# Patient Record
Sex: Female | Born: 1955 | Race: Black or African American | Hispanic: No | Smoking: Current every day smoker
Health system: Southern US, Community
[De-identification: ages and names within clinical notes are randomized; demographics above are authoritative.]

## PROBLEM LIST (undated history)

## (undated) DIAGNOSIS — C189 Malignant neoplasm of colon, unspecified: Secondary | ICD-10-CM

## (undated) DIAGNOSIS — D509 Iron deficiency anemia, unspecified: Secondary | ICD-10-CM

## (undated) DIAGNOSIS — E119 Type 2 diabetes mellitus without complications: Secondary | ICD-10-CM

## (undated) DIAGNOSIS — F209 Schizophrenia, unspecified: Secondary | ICD-10-CM

## (undated) DIAGNOSIS — T7840XA Allergy, unspecified, initial encounter: Secondary | ICD-10-CM

## (undated) DIAGNOSIS — K219 Gastro-esophageal reflux disease without esophagitis: Secondary | ICD-10-CM

## (undated) HISTORY — DX: Type 2 diabetes mellitus without complications: E11.9

## (undated) HISTORY — DX: Schizophrenia, unspecified: F20.9

## (undated) HISTORY — PX: EXCISIONAL HEMORRHOIDECTOMY: SHX1541

## (undated) HISTORY — DX: Gastro-esophageal reflux disease without esophagitis: K21.9

## (undated) HISTORY — DX: Iron deficiency anemia, unspecified: D50.9

## (undated) HISTORY — DX: Allergy, unspecified, initial encounter: T78.40XA

---

## 2002-03-08 ENCOUNTER — Encounter: Payer: Self-pay | Admitting: Internal Medicine

## 2002-03-08 ENCOUNTER — Ambulatory Visit (HOSPITAL_COMMUNITY): Admission: RE | Admit: 2002-03-08 | Discharge: 2002-03-08 | Payer: Self-pay | Admitting: Internal Medicine

## 2003-03-14 ENCOUNTER — Ambulatory Visit (HOSPITAL_COMMUNITY): Admission: RE | Admit: 2003-03-14 | Discharge: 2003-03-14 | Payer: Self-pay | Admitting: Internal Medicine

## 2003-03-14 ENCOUNTER — Encounter: Payer: Self-pay | Admitting: Internal Medicine

## 2003-04-29 ENCOUNTER — Other Ambulatory Visit: Admission: RE | Admit: 2003-04-29 | Discharge: 2003-04-29 | Payer: Self-pay | Admitting: Obstetrics & Gynecology

## 2005-02-14 ENCOUNTER — Ambulatory Visit (HOSPITAL_COMMUNITY): Admission: RE | Admit: 2005-02-14 | Discharge: 2005-02-14 | Payer: Self-pay | Admitting: Internal Medicine

## 2006-02-24 ENCOUNTER — Ambulatory Visit (HOSPITAL_COMMUNITY): Admission: RE | Admit: 2006-02-24 | Discharge: 2006-02-24 | Payer: Self-pay | Admitting: Internal Medicine

## 2006-04-21 ENCOUNTER — Ambulatory Visit (HOSPITAL_COMMUNITY): Admission: RE | Admit: 2006-04-21 | Discharge: 2006-04-21 | Payer: Self-pay | Admitting: Gastroenterology

## 2006-04-21 ENCOUNTER — Ambulatory Visit: Payer: Self-pay | Admitting: Gastroenterology

## 2006-04-21 HISTORY — PX: COLONOSCOPY: SHX174

## 2007-06-03 ENCOUNTER — Ambulatory Visit (HOSPITAL_COMMUNITY): Admission: RE | Admit: 2007-06-03 | Discharge: 2007-06-03 | Payer: Self-pay | Admitting: Internal Medicine

## 2009-01-20 ENCOUNTER — Encounter: Payer: Self-pay | Admitting: Gastroenterology

## 2009-02-01 ENCOUNTER — Ambulatory Visit (HOSPITAL_COMMUNITY): Admission: RE | Admit: 2009-02-01 | Discharge: 2009-02-01 | Payer: Self-pay | Admitting: Internal Medicine

## 2009-02-22 ENCOUNTER — Ambulatory Visit: Payer: Self-pay | Admitting: Gastroenterology

## 2009-02-22 ENCOUNTER — Ambulatory Visit (HOSPITAL_COMMUNITY): Admission: RE | Admit: 2009-02-22 | Discharge: 2009-02-22 | Payer: Self-pay | Admitting: Gastroenterology

## 2009-02-22 HISTORY — PX: COLONOSCOPY: SHX174

## 2009-03-15 ENCOUNTER — Other Ambulatory Visit: Admission: RE | Admit: 2009-03-15 | Discharge: 2009-03-15 | Payer: Self-pay | Admitting: Obstetrics and Gynecology

## 2009-12-15 ENCOUNTER — Encounter: Payer: Self-pay | Admitting: Urgent Care

## 2010-08-07 ENCOUNTER — Ambulatory Visit (HOSPITAL_COMMUNITY)
Admission: RE | Admit: 2010-08-07 | Discharge: 2010-08-07 | Payer: Self-pay | Source: Home / Self Care | Attending: Internal Medicine | Admitting: Internal Medicine

## 2010-08-26 ENCOUNTER — Encounter: Payer: Self-pay | Admitting: Internal Medicine

## 2010-08-27 ENCOUNTER — Encounter: Payer: Self-pay | Admitting: Internal Medicine

## 2010-09-04 NOTE — Medication Information (Signed)
Summary: Tax adviser   Imported By: Diana Eves 12/15/2009 15:22:27  _____________________________________________________________________  External Attachment:    Type:   Image     Comment:   External Document  Appended Document: RX Folder:BENEFIBER    Prescriptions: BENEFIBER  TABS (WHEAT DEXTRIN) 1-2 by mouth daily as directed  #62 x 11   Entered and Authorized by:   Joselyn Arrow FNP-BC   Signed by:   Joselyn Arrow FNP-BC on 12/18/2009   Method used:   Electronically to        [facility] (retail)       [address]       [place], [place]  [address]       Ph: [phone]       Fax: [phone]   RxID:   [id number]

## 2010-11-07 ENCOUNTER — Telehealth: Payer: Self-pay

## 2010-11-07 NOTE — Telephone Encounter (Signed)
Fax from [facility]..the patient resides at [facility]...currently on Benefiber caplets one bid ..currently unavailable. Fax says. May we d/c benefiber, start metamucil caps one po bid or another choice. Please advise.

## 2010-11-08 NOTE — Telephone Encounter (Signed)
Informed [facility] @ [facility].

## 2010-11-08 NOTE — Telephone Encounter (Signed)
D/c Benefiber. Metamucil 1 cap BID.

## 2010-12-18 NOTE — Op Note (Signed)
NAME:  Mary Wells, Mary Wells                   ACCOUNT NO.:  [id number]   MEDICAL RECORD NO.:  [id number]          PATIENT TYPE:  AMB   LOCATION:  DAY                           FACILITY:  [facility]   PHYSICIAN:  Kassie Mends, M.D.      DATE OF BIRTH:  [birth year]   DATE OF PROCEDURE:  02/22/2009  DATE OF DISCHARGE:  02/22/2009                               OPERATIVE REPORT   REFERRING PHYSICIAN:  Tesfaye D. Felecia Shelling, MD   PROCEDURE:  Limited colonoscopy due to poor bowel prep   INDICATION FOR EXAM:  Mary Wells is a 55 year old female, who presented in  September 2007 for a colonoscopy.  She had a poor bowel prep which  showed moderate liquid and formed stool and the cecum was not  visualized.  She was asked to come back in 6 months to 1 year for a  repeat colonoscopy with a 2-day bowel prep.  She presented in 2010 for  her screening and yesterday she had a poor bowel prep.  She was asked to  take another gallon of GoLYTELY and return today for an exam.   FINDINGS:  1. Formed stools in the right colon which limited the extent of the      exam.  2. The scope was passed to approximately the proximal transverse      colon.  No polyps, masses, inflammatory changes, diverticular, or      AVMs were seen.  3. Normal retroflexed view of the rectum.   RECOMMENDATIONS:  1. Colonoscopy in 5 years with 2-day bowel prep under direct      supervision.  2. She should follow a high-fiber diet.  She is given a handout on a      high-fiber diet.   MEDICATIONS:  1. Demerol 50 mg IV.  2. Versed 3 mg IV.   PROCEDURE TECHNIQUE:  Physical exam was performed.  Informed consent was  obtained from the patient after explaining the benefits, risks, and  alternatives to the procedure.  The patient was connected to the monitor  and placed in the left lateral position.  Continuous oxygen was provided  by nasal cannula.  IV medicine administered through an indwelling  cannula.  After administration of sedation and rectal exam,  the  patient's rectum was intubated  and the scope was advanced under direct visualization to the proximal  transverse colon.  The scope was removed slowly by carefully examining  the color, texture, anatomy, and integrity of the mucosa on the way out.  The patient was recovered in endoscopy and discharged home in  satisfactory condition.      Kassie Mends, M.D.  Electronically Signed     SM/MEDQ  D:  02/22/2009  T:  02/23/2009  Job:  [id number]   cc:   Mary D. Felecia Shelling, MD  Fax: [phone]

## 2010-12-21 NOTE — Op Note (Signed)
NAME:  Mary Wells, Mary Wells                   ACCOUNT NO.:  [id number]   MEDICAL RECORD NO.:  [id number]          PATIENT TYPE:  AMB   LOCATION:  DAY                           FACILITY:  [facility]   PHYSICIAN:  Kassie Mends, M.D.      DATE OF BIRTH:  03/05/1956   DATE OF PROCEDURE:  04/21/2006  DATE OF DISCHARGE:  04/21/2006                                 OPERATIVE REPORT   PROCEDURE:  Colonoscopy.   INDICATION FOR EXAM:  [name] is a 55 year old female who presents for  average risk colon cancer screening.   FINDINGS:  [name] had a limited colonoscopy due to a poor bowel prep with  moderate amount of retained liquid and formed stool.  Polyps less than 1 cm  would have been easily missed.  No masses or hemorrhoids were seen.   RECOMMENDATIONS:  1. Repeat colonoscopy in six months to one year with two day bowel prep      using Osmoprep.  2. Follow up with Dr. Felecia Shelling.   MEDICATIONS:  1. Demerol 75 mg IV.  2. Versed 3 mg IV.   Physical exam was performed.  Informed consent was obtained from the patient  after explaining the benefits, risks and alternatives to the procedure.  The  patient was connected to the monitor and placed in the left lateral  position.  Continuous oxygen was provided by nasal cannula. IV medicines  were administered via indwelling cannula.  After administration of sedation  and rectal exam, the patient's rectum was intubated.  The scope was advanced  under direct visualization to the right colon.  The scope could not be  advanced beyond the right colon due to the visualization was impaired by a  large amount of liquid stool and the liquid stool could not be aspirated due  to the quantity of particulate matter contained in that liquid stool.  The  particular matter was clogging up the scope.  The  scope was subsequently removed slowly by carefully examining the color,  texture, anatomy, and integrity of the mucosa on the way out.  It was a  limited view.  The patient was  recovered in the endoscopy suite and  discharged home in satisfactory condition.      Kassie Mends, M.D.  Electronically Signed     SM/MEDQ  D:  04/22/2006  T:  04/23/2006  Job:  [id number]   cc:   Tesfaye D. Felecia Shelling, MD  Fax: [phone]

## 2013-07-13 ENCOUNTER — Other Ambulatory Visit (HOSPITAL_COMMUNITY): Payer: Self-pay | Admitting: Internal Medicine

## 2013-07-13 ENCOUNTER — Encounter: Payer: Self-pay | Admitting: Gastroenterology

## 2013-07-13 DIAGNOSIS — Z139 Encounter for screening, unspecified: Secondary | ICD-10-CM

## 2013-07-20 ENCOUNTER — Ambulatory Visit (HOSPITAL_COMMUNITY)
Admission: RE | Admit: 2013-07-20 | Discharge: 2013-07-20 | Disposition: A | Discharge status: Home / Self Care | Payer: Medicaid Other | Source: Ambulatory Visit | Attending: Internal Medicine | Admitting: Internal Medicine

## 2013-07-20 DIAGNOSIS — Z139 Encounter for screening, unspecified: Secondary | ICD-10-CM

## 2013-07-20 DIAGNOSIS — Z1231 Encounter for screening mammogram for malignant neoplasm of breast: Secondary | ICD-10-CM | POA: Insufficient documentation

## 2013-07-21 ENCOUNTER — Ambulatory Visit: Payer: Self-pay | Admitting: Gastroenterology

## 2013-07-21 ENCOUNTER — Telehealth: Payer: Self-pay | Admitting: Gastroenterology

## 2013-07-21 NOTE — Telephone Encounter (Signed)
Pt was a no show

## 2013-08-05 DIAGNOSIS — E119 Type 2 diabetes mellitus without complications: Secondary | ICD-10-CM

## 2013-08-05 HISTORY — DX: Type 2 diabetes mellitus without complications: E11.9

## 2013-09-15 ENCOUNTER — Encounter (HOSPITAL_COMMUNITY): Payer: Medicaid Other | Attending: Hematology and Oncology

## 2013-09-15 ENCOUNTER — Encounter (HOSPITAL_COMMUNITY): Payer: Self-pay

## 2013-09-15 VITALS — BP 125/69 | HR 108 | Temp 98.1°F | Resp 20 | Ht 64.0 in | Wt 164.8 lb

## 2013-09-15 DIAGNOSIS — E611 Iron deficiency: Secondary | ICD-10-CM

## 2013-09-15 DIAGNOSIS — Z87891 Personal history of nicotine dependence: Secondary | ICD-10-CM | POA: Insufficient documentation

## 2013-09-15 DIAGNOSIS — F209 Schizophrenia, unspecified: Secondary | ICD-10-CM | POA: Insufficient documentation

## 2013-09-15 DIAGNOSIS — G259 Extrapyramidal and movement disorder, unspecified: Secondary | ICD-10-CM | POA: Insufficient documentation

## 2013-09-15 DIAGNOSIS — E119 Type 2 diabetes mellitus without complications: Secondary | ICD-10-CM | POA: Insufficient documentation

## 2013-09-15 DIAGNOSIS — D509 Iron deficiency anemia, unspecified: Secondary | ICD-10-CM

## 2013-09-15 DIAGNOSIS — K219 Gastro-esophageal reflux disease without esophagitis: Secondary | ICD-10-CM | POA: Insufficient documentation

## 2013-09-15 DIAGNOSIS — T433X5A Adverse effect of phenothiazine antipsychotics and neuroleptics, initial encounter: Secondary | ICD-10-CM | POA: Insufficient documentation

## 2013-09-15 DIAGNOSIS — D649 Anemia, unspecified: Secondary | ICD-10-CM

## 2013-09-15 LAB — CBC WITH DIFFERENTIAL/PLATELET
BASOS PCT: 0 % (ref 0–1)
Basophils Absolute: 0 10*3/uL (ref 0.0–0.1)
Eosinophils Absolute: 0 10*3/uL (ref 0.0–0.7)
Eosinophils Relative: 0 % (ref 0–5)
HEMATOCRIT: 29.8 % — AB (ref 36.0–46.0)
HEMOGLOBIN: 9 g/dL — AB (ref 12.0–15.0)
LYMPHS ABS: 2.2 10*3/uL (ref 0.7–4.0)
LYMPHS PCT: 19 % (ref 12–46)
MCH: 18.8 pg — ABNORMAL LOW (ref 26.0–34.0)
MCHC: 30.2 g/dL (ref 30.0–36.0)
MCV: 62.3 fL — AB (ref 78.0–100.0)
MONOS PCT: 10 % (ref 3–12)
Monocytes Absolute: 1.1 10*3/uL — ABNORMAL HIGH (ref 0.1–1.0)
NEUTROS ABS: 8.1 10*3/uL — AB (ref 1.7–7.7)
Neutrophils Relative %: 71 % (ref 43–77)
Platelets: 416 10*3/uL — ABNORMAL HIGH (ref 150–400)
RBC: 4.78 MIL/uL (ref 3.87–5.11)
RDW: 20.5 % — ABNORMAL HIGH (ref 11.5–15.5)
WBC Morphology: INCREASED
WBC: 11.4 10*3/uL — AB (ref 4.0–10.5)

## 2013-09-15 LAB — IRON AND TIBC
IRON: 18 ug/dL — AB (ref 42–135)
SATURATION RATIOS: 5 % — AB (ref 20–55)
TIBC: 349 ug/dL (ref 250–470)
UIBC: 331 ug/dL (ref 125–400)

## 2013-09-15 LAB — COMPREHENSIVE METABOLIC PANEL
ALBUMIN: 2.8 g/dL — AB (ref 3.5–5.2)
ALK PHOS: 93 U/L (ref 39–117)
ALT: 10 U/L (ref 0–35)
AST: 17 U/L (ref 0–37)
BUN: 12 mg/dL (ref 6–23)
CO2: 25 mEq/L (ref 19–32)
Calcium: 9.7 mg/dL (ref 8.4–10.5)
Chloride: 103 mEq/L (ref 96–112)
Creatinine, Ser: 0.91 mg/dL (ref 0.50–1.10)
GFR calc Af Amer: 78 mL/min — ABNORMAL LOW (ref >90)
GFR calc non Af Amer: 68 mL/min — ABNORMAL LOW (ref >90)
Glucose, Bld: 165 mg/dL — ABNORMAL HIGH (ref 70–99)
POTASSIUM: 4.1 meq/L (ref 3.7–5.3)
SODIUM: 142 meq/L (ref 137–147)
TOTAL PROTEIN: 7.4 g/dL (ref 6.0–8.3)
Total Bilirubin: <0.2 mg/dL — ABNORMAL LOW (ref 0.3–1.2)

## 2013-09-15 LAB — RETICULOCYTES
RBC.: 4.78 MIL/uL (ref 3.87–5.11)
RETIC COUNT ABSOLUTE: 57.4 10*3/uL (ref 19.0–186.0)
RETIC CT PCT: 1.2 % (ref 0.4–3.1)

## 2013-09-15 LAB — LACTATE DEHYDROGENASE: LDH: 135 U/L (ref 94–250)

## 2013-09-15 NOTE — Progress Notes (Signed)
[place] A. Barnet Glasgow, M.D.  NEW PATIENT EVALUATION   Name: Mary Wells Date: 09/15/2013 MRN: [id number] DOB: 12-Dec-1955  PCP: Rosita Fire, MD   REFERRING PHYSICIAN: Rosita Fire, MD  REASON FOR REFERRAL: Anemia with microcytosis     HISTORY OF PRESENT ILLNESS:Mary Wells is a 58 y.o. female who is referred by her family physician while living in assisted living for evaluation of anemia and microcytosis. Patient denies being excessively fatigued. She was recently switched from Thorazine to Prolixin and has developed severe extrapyramidal syndrome with tremors and pill rolling upper extremity activity. She never manifested these neurologic findings while on Thorazine. She denies any craving for ice. She denies any melena, hematochezia, hematuria, vaginal bleeding, epistaxis, or mop this is. Urine is normal color. She denies any PND, orthopnea, palpitations, chest pain, skin rash, fever, night sweats, easy satiety, headache, or seizures. She has lived in assisted living for at least the last 30 years. She has one son who she very rarely sees.  PAST MEDICAL HISTORY:  has a past medical history of Schizophrenia; GERD (gastroesophageal reflux disease); Allergy; and Diabetes mellitus without complication.     PAST SURGICAL HISTORY: Past Surgical History  Procedure Laterality Date  . Colonoscopy  04/21/2006    SLF: limited colonoscopy due to a poor bowel prep   . Colonoscopy  02/22/2009    SLF: poor bowel prep/Formed stools in the right colon which limited the extent of the exam/The scope was passed to approximately the proximal transverse colon.  No polyps, masses, inflammatory changes, diverticular/normal rectum     CURRENT MEDICATIONS: has a current medication list which includes the following prescription(s): benztropine, clonazepam, divalproex, fluphenazine, loratadine, metformin, and pantoprazole.   ALLERGIES: Review of  patient's allergies indicates no known allergies.   SOCIAL HISTORY:  reports that she has quit smoking. She does not have any smokeless tobacco history on file. She reports that she does not drink alcohol or use illicit drugs.has lived in assisted living for the past 30 years.    FAMILY HISTORY: Family history is unknown by patient.    REVIEW OF SYSTEMS:  Other than that discussed above is noncontributory.    PHYSICAL EXAM:  height is 5\' 4"  (1.626 m) and weight is 164 lb 12.8 oz (74.753 kg). Her oral temperature is 98.1 F (36.7 C). Her blood pressure is 125/69 and her pulse is 108. Her respiration is 20.    GENERAL:alert, no distress and comfortable.Very pleasant.  SKIN: skin color, texture, turgor are normal, no rashes or significant lesions EYES: normal, Conjunctiva are pink and non-injected, sclera clear OROPHARYNX:no exudate, no erythema and lips, buccal mucosa, and tongue normal  NECK: supple, thyroid normal size, non-tender, without nodularity CHEST:  Normal AP diameter with no breast masses. LYMPH:  no palpable lymphadenopathy in the cervical, axillary or inguinal LUNGS: clear to auscultation and percussion with normal breathing effort HEART: regular rate & rhythm and no murmurs ABDOMEN:abdomen soft, non-tender and normal bowel sounds MUSCULOSKELETALl:no cyanosis of digits, no clubbing or edema  NEURO: alert & oriented x 3 with fluent speech, no focal motor/sensory deficits. Resting tremor bilaterally with pill rolling activity. Tardive dyskinesia is also noted.     LABORATORY DATA:  No results found for any previous visit.laboratory tests provided by family physician revealed WBC 11.1 with hemoglobin 9.7 and platelets of 356,000 performed on 08/16/2013 with MCV of 63.8. Metamyelocytes are seen on peripheral smear along with teardrop cells,  schistocytes, target cells, and polychromasia. BUN and creatinine were normal. Iron was 21 with TIBC of 285, ferritin of 14, B12 of 1287,  and folate greater than 24.   Urinalysis No results found for this basename: colorurine,  appearanceur,  labspec,  phurine,  glucoseu,  hgbur,  bilirubinur,  ketonesur,  proteinur,  urobilinogen,  nitrite,  leukocytesur      @RADIOGRAPHY : MM Digital Screening Status: Final result            Study Result    CLINICAL DATA: Screening.  EXAM:  DIGITAL SCREENING BILATERAL MAMMOGRAM WITH CAD  COMPARISON: Previous exam(s).  ACR Breast Density Category c: The breasts are heterogeneously  dense, which may obscure small masses.  FINDINGS:  There are no findings suspicious for malignancy. Images were  processed with CAD.  IMPRESSION:  No mammographic evidence of malignancy. A result letter of this  screening mammogram will be mailed directly to the patient.  RECOMMENDATION:  Screening mammogram in one year. (Code:SM-B-01Y)  BI-RADS CATEGORY 1: Negative  Electronically Signed  By: Shon Hale M.D.  On: 07/21/2013 09   .  PATHOLOGY: peripheral smear reveals basophilia with target cells, poikilocytosis, polychromasia, and schistocytes along with teardrop cells.   IMPRESSION:  #1. 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 #2. Undifferentiated schizophrenia, previously controlled well with minimal adverse effects on Thorazine, recently changed to Prolixin with profound extrapyramidal syndrome in addition to tardive dyskinesia. #3. Diabetes mellitus, type II, non-insulin requiring.   PLAN:  #1. Additional outpatient peripheral blood testing will be done today and if a definitive diagnosis is obtained, appropriate therapy will be administered. If data is insufficient to make a definitive diagnosis, bone marrow aspiration biopsy will be scheduled. #2. Change back to Thorazine control schizophrenic symptoms and hopefully to  reverse extrapyramidal syndrome and tardive dyskinesia.  I appreciate the opportunity of sharing in his care.    Mary Bradford, MD 09/15/2013 4:28 PM

## 2013-09-15 NOTE — Patient Instructions (Signed)
[place] Discharge Instructions  RECOMMENDATIONS MADE BY THE CONSULTANT AND ANY TEST RESULTS WILL BE SENT TO YOUR REFERRING PHYSICIAN.  Return in 3 weeks for lab work (CBC) and follow-up visit with Dr. Barnet Glasgow.   Thank you for choosing [facility] to provide your oncology and hematology care.  To afford each patient quality time with our providers, please arrive at least 15 minutes before your scheduled appointment time.  With your help, our goal is to use those 15 minutes to complete the necessary work-up to ensure our physicians have the information they need to help with your evaluation and healthcare recommendations.    Effective January 1st, 2014, we ask that you re-schedule your appointment with our physicians should you arrive 10 or more minutes late for your appointment.  We strive to give you quality time with our providers, and arriving late affects you and other patients whose appointments are after yours.    Again, thank you for choosing [facility].  Our hope is that these requests will decrease the amount of time that you wait before being seen by our physicians.       _____________________________________________________________  Should you have questions after your visit to [facility], please contact our office at [phone] between the hours of 8:30 a.m. and 5:00 p.m.  Voicemails left after 4:30 p.m. will not be returned until the following business day.  For prescription refill requests, have your pharmacy contact our office with your prescription refill request.

## 2013-09-15 NOTE — Progress Notes (Signed)
Mary Wells presented for labwork. Labs per MD order drawn via Peripheral Line 21 gauge needle inserted in left antecubital. Good blood return present. Procedure without incident.  Needle removed intact. Patient tolerated procedure well.

## 2013-09-16 LAB — FOLATE: Folate: >20 ng/mL

## 2013-09-16 LAB — DIRECT ANTIGLOBULIN TEST (NOT AT ARMC)
DAT, IgG: NEGATIVE
DAT, complement: NEGATIVE

## 2013-09-16 LAB — FERRITIN: FERRITIN: 9 ng/mL — AB (ref 10–291)

## 2013-09-16 LAB — HAPTOGLOBIN: Haptoglobin: 333 mg/dL — ABNORMAL HIGH (ref 45–215)

## 2013-09-16 LAB — VITAMIN B12: Vitamin B-12: 985 pg/mL — ABNORMAL HIGH (ref 211–911)

## 2013-09-17 LAB — HEMOGLOBINOPATHY EVALUATION
HEMOGLOBIN OTHER: 0 %
HGB A2 QUANT: 1.9 % — AB (ref 2.2–3.2)
HGB A: 98.1 % — AB (ref 96.8–97.8)
Hgb F Quant: 0 % (ref 0.0–2.0)
Hgb S Quant: 0 %

## 2013-09-18 DIAGNOSIS — D509 Iron deficiency anemia, unspecified: Secondary | ICD-10-CM

## 2013-09-18 HISTORY — DX: Iron deficiency anemia, unspecified: D50.9

## 2013-09-18 LAB — BCR/ABL GENE REARRANGEMENT QNT, PCR
BCR ABL1 / ABL1 IS: 0 %
BCR ABL1 / ABL1: 0 %

## 2013-09-18 LAB — P210 BCR-ABL 1: P210 BCR ABL1: NOT DETECTED

## 2013-09-18 LAB — P190 BCR-ABL 1: P190 BCR ABL1: NOT DETECTED

## 2013-09-20 LAB — JAK2 GENOTYPR: JAK2 GenotypR: NOT DETECTED

## 2013-09-21 ENCOUNTER — Ambulatory Visit: Payer: Medicaid Other | Admitting: Gastroenterology

## 2013-09-24 ENCOUNTER — Ambulatory Visit (HOSPITAL_COMMUNITY): Payer: Medicaid Other

## 2013-10-06 ENCOUNTER — Telehealth: Payer: Self-pay | Admitting: Gastroenterology

## 2013-10-06 ENCOUNTER — Ambulatory Visit: Payer: Medicaid Other | Admitting: Gastroenterology

## 2013-10-06 NOTE — Telephone Encounter (Signed)
Pt was a no show

## 2013-10-07 ENCOUNTER — Ambulatory Visit (HOSPITAL_COMMUNITY): Payer: Medicaid Other

## 2013-10-07 ENCOUNTER — Other Ambulatory Visit (HOSPITAL_COMMUNITY): Payer: Medicaid Other

## 2013-10-08 NOTE — Progress Notes (Signed)
This encounter was created in error - please disregard.

## 2013-10-11 NOTE — Telephone Encounter (Signed)
3 missed appointments since 07/2013.

## 2013-10-19 ENCOUNTER — Other Ambulatory Visit (HOSPITAL_COMMUNITY): Payer: Medicaid Other

## 2013-10-19 ENCOUNTER — Ambulatory Visit (HOSPITAL_COMMUNITY): Payer: Medicaid Other

## 2013-10-22 ENCOUNTER — Encounter (HOSPITAL_COMMUNITY): Payer: Medicaid Other | Attending: Hematology and Oncology

## 2013-10-22 ENCOUNTER — Encounter (HOSPITAL_BASED_OUTPATIENT_CLINIC_OR_DEPARTMENT_OTHER): Payer: Medicaid Other

## 2013-10-22 VITALS — BP 99/67 | HR 102 | Temp 98.7°F | Resp 20 | Wt 161.0 lb

## 2013-10-22 DIAGNOSIS — T433X5A Adverse effect of phenothiazine antipsychotics and neuroleptics, initial encounter: Secondary | ICD-10-CM | POA: Insufficient documentation

## 2013-10-22 DIAGNOSIS — D509 Iron deficiency anemia, unspecified: Secondary | ICD-10-CM

## 2013-10-22 DIAGNOSIS — D649 Anemia, unspecified: Secondary | ICD-10-CM | POA: Insufficient documentation

## 2013-10-22 DIAGNOSIS — E611 Iron deficiency: Secondary | ICD-10-CM

## 2013-10-22 LAB — CBC WITH DIFFERENTIAL/PLATELET
Basophils Absolute: 0 10*3/uL (ref 0.0–0.1)
Basophils Relative: 0 % (ref 0–1)
EOS PCT: 0 % (ref 0–5)
Eosinophils Absolute: 0 10*3/uL (ref 0.0–0.7)
HEMATOCRIT: 28.9 % — AB (ref 36.0–46.0)
Hemoglobin: 8.8 g/dL — ABNORMAL LOW (ref 12.0–15.0)
LYMPHS ABS: 2.3 10*3/uL (ref 0.7–4.0)
LYMPHS PCT: 20 % (ref 12–46)
MCH: 18.7 pg — ABNORMAL LOW (ref 26.0–34.0)
MCHC: 30.4 g/dL (ref 30.0–36.0)
MCV: 61.4 fL — AB (ref 78.0–100.0)
MONO ABS: 1 10*3/uL (ref 0.1–1.0)
Monocytes Relative: 8 % (ref 3–12)
Neutro Abs: 8.2 10*3/uL — ABNORMAL HIGH (ref 1.7–7.7)
Neutrophils Relative %: 71 % (ref 43–77)
Platelets: 347 10*3/uL (ref 150–400)
RBC: 4.71 MIL/uL (ref 3.87–5.11)
RDW: 19.7 % — AB (ref 11.5–15.5)
WBC: 11.5 10*3/uL — AB (ref 4.0–10.5)

## 2013-10-22 MED ORDER — SODIUM CHLORIDE 0.9 % IV SOLN
Freq: Once | INTRAVENOUS | Status: AC
  Administered 2013-10-22: 250 mL via INTRAVENOUS

## 2013-10-22 MED ORDER — SODIUM CHLORIDE 0.9 % IJ SOLN: 10.0000 mL | INTRAMUSCULAR | Status: DC | PRN

## 2013-10-22 MED ORDER — SODIUM CHLORIDE 0.9 % IV SOLN
1020.0000 mg | Freq: Once | INTRAVENOUS | Status: AC
  Administered 2013-10-22: 1020 mg via INTRAVENOUS
  Filled 2013-10-22: qty 34

## 2013-10-22 NOTE — Progress Notes (Signed)
Labs drawn today for cbc/diff 

## 2013-10-22 NOTE — Progress Notes (Signed)
Alert, in no distress.  Tolerated feraheme infusion without incident.

## 2013-10-28 ENCOUNTER — Encounter (HOSPITAL_COMMUNITY): Payer: Self-pay

## 2013-10-28 ENCOUNTER — Encounter (HOSPITAL_BASED_OUTPATIENT_CLINIC_OR_DEPARTMENT_OTHER): Payer: Medicaid Other

## 2013-10-28 VITALS — BP 114/74 | HR 109 | Temp 98.0°F | Resp 18 | Wt 161.4 lb

## 2013-10-28 DIAGNOSIS — E611 Iron deficiency: Secondary | ICD-10-CM

## 2013-10-28 DIAGNOSIS — E119 Type 2 diabetes mellitus without complications: Secondary | ICD-10-CM

## 2013-10-28 DIAGNOSIS — F209 Schizophrenia, unspecified: Secondary | ICD-10-CM

## 2013-10-28 DIAGNOSIS — D509 Iron deficiency anemia, unspecified: Secondary | ICD-10-CM

## 2013-10-28 NOTE — Patient Instructions (Signed)
[place] Discharge Instructions  RECOMMENDATIONS MADE BY THE CONSULTANT AND ANY TEST RESULTS WILL BE SENT TO YOUR REFERRING PHYSICIAN.  We will see you in 6 months you will see the doctor at that time and have repeat labs.  Thank you for choosing [place] to provide your oncology and hematology care.  To afford each patient quality time with our providers, please arrive at least 15 minutes before your scheduled appointment time.  With your help, our goal is to use those 15 minutes to complete the necessary work-up to ensure our physicians have the information they need to help with your evaluation and healthcare recommendations.    Effective January 1st, 2014, we ask that you re-schedule your appointment with our physicians should you arrive 10 or more minutes late for your appointment.  We strive to give you quality time with our providers, and arriving late affects you and other patients whose appointments are after yours.    Again, thank you for choosing [facility].  Our hope is that these requests will decrease the amount of time that you wait before being seen by our physicians.       _____________________________________________________________  Should you have questions after your visit to [facility], please contact our office at [phone] between the hours of 8:30 a.m. and 5:00 p.m.  Voicemails left after 4:30 p.m. will not be returned until the following business day.  For prescription refill requests, have your pharmacy contact our office with your prescription refill request.

## 2013-10-28 NOTE — Progress Notes (Signed)
[facility]  OFFICE PROGRESS Jolee Ewing, MD [address] [place] [address]  DIAGNOSIS: Iron deficiency - Plan: CBC with Differential, Ferritin, CBC with Differential, CBC with Differential, Ferritin, CANCELED: Ferritin  Schizophrenia  Diabetes mellitus  Chief Complaint  Patient presents with  . Iron deficiency anemia   CURRENT THERAPY: Feraheme 1020 mg on 10/22/2013  INTERVAL HISTORY: Mary Wells 58 y.o. female returns for followup of iron deficiency anemia having received intravenous iron on 10/22/2013.  Answer simple questions appropriately. Appetite good with regular bowel movements and no nausea, vomiting, fever, or night sweats. She tolerated intravenous iron well with no muscle aches, body aches, or fever. Albumin to regular with no melena, hematochezia, hematuria, epistaxis, or hemoptysis.  MEDICAL HISTORY: Past Medical History  Diagnosis Date  . Schizophrenia   . GERD (gastroesophageal reflux disease)   . Allergy   . Diabetes mellitus without complication     INTERIM HISTORY: has Diabetes mellitus; Schizophrenia; and Iron deficiency on her problem list.    ALLERGIES:  has No Known Allergies.  MEDICATIONS: has a current medication list which includes the following prescription(s): benztropine, clonazepam, divalproex, fluphenazine, loratadine, metformin, and pantoprazole.  SURGICAL HISTORY:  Past Surgical History  Procedure Laterality Date  . Colonoscopy  04/21/2006    SLF: limited colonoscopy due to a poor bowel prep   . Colonoscopy  02/22/2009    SLF: poor bowel prep/Formed stools in the right colon which limited the extent of the exam/The scope was passed to approximately the proximal transverse colon.  No polyps, masses, inflammatory changes, diverticular/normal rectum    FAMILY HISTORY: family history is not on file.  SOCIAL HISTORY:  reports that she has quit smoking. She does not have any  smokeless tobacco history on file. She reports that she does not drink alcohol or use illicit drugs.  REVIEW OF SYSTEMS:  Other than that discussed above is noncontributory.  PHYSICAL EXAMINATION: ECOG PERFORMANCE STATUS: 1 - Symptomatic but completely ambulatory  Blood pressure 114/74, pulse 109, temperature 98 F (36.7 C), temperature source Oral, resp. rate 18, weight 161 lb 6.4 oz (73.211 kg), SpO2 99.00%.  GENERAL:alert, no distress and comfortable SKIN: skin color, texture, turgor are normal, no rashes or significant lesions EYES: PERLA; Conjunctiva are pink and non-injected, sclera clear SINUSES: No redness or tenderness over maxillary or ethmoid sinuses OROPHARYNX:no exudate, no erythema on lips, buccal mucosa, or tongue. NECK: supple, thyroid normal size, non-tender, without nodularity. No masses CHEST: Normal AP diameter with no breast masses. LYMPH:  no palpable lymphadenopathy in the cervical, axillary or inguinal LUNGS: clear to auscultation and percussion with normal breathing effort HEART: regular rate & rhythm and no murmurs. ABDOMEN:abdomen soft, non-tender and normal bowel sounds MUSCULOSKELETAL:no cyanosis of digits and no clubbing. Range of motion normal.  NEURO: alert & oriented x 3 with fluent speech, no focal motor/sensory deficits. Much less prominent tremulousness today versus her last visit. No gross evidence of tardive dyskinesia today.   LABORATORY DATA:    Results for CHIANNA, SPIRITO (MRN [id number]) as of 10/28/2013 13:57  Ref. Range 09/15/2013 15:35  Iron Latest Range: 42-135 ug/dL 18 (L)  UIBC Latest Range: 125-400 ug/dL 331  TIBC Latest Range: 250-470 ug/dL 349  Saturation Ratios Latest Range: 20-55 % 5 (L)  Ferritin Latest Range: 10-291 ng/mL 9 (L)      Infusion on 10/22/2013  Component Date Value Ref Range Status  . WBC 10/22/2013 11.5*  4.0 - 10.5 K/uL Final  . RBC 10/22/2013 4.71  3.87 - 5.11 MIL/uL Final  . Hemoglobin 10/22/2013 8.8* 12.0 -  15.0 g/dL Final  . HCT 10/22/2013 28.9* 36.0 - 46.0 % Final  . MCV 10/22/2013 61.4* 78.0 - 100.0 fL Final  . MCH 10/22/2013 18.7* 26.0 - 34.0 pg Final  . MCHC 10/22/2013 30.4  30.0 - 36.0 g/dL Final  . RDW 10/22/2013 19.7* 11.5 - 15.5 % Final  . Platelets 10/22/2013 347  150 - 400 K/uL Final  . Neutrophils Relative % 10/22/2013 71  43 - 77 % Final  . Neutro Abs 10/22/2013 8.2* 1.7 - 7.7 K/uL Final  . Lymphocytes Relative 10/22/2013 20  12 - 46 % Final  . Lymphs Abs 10/22/2013 2.3  0.7 - 4.0 K/uL Final  . Monocytes Relative 10/22/2013 8  3 - 12 % Final  . Monocytes Absolute 10/22/2013 1.0  0.1 - 1.0 K/uL Final  . Eosinophils Relative 10/22/2013 0  0 - 5 % Final  . Eosinophils Absolute 10/22/2013 0.0  0.0 - 0.7 K/uL Final  . Basophils Relative 10/22/2013 0  0 - 1 % Final  . Basophils Absolute 10/22/2013 0.0  0.0 - 0.1 K/uL Final    Results   Hemoglobinopathy evaluation (Order [id number])     Hgb A2 Quant 2.2 - 3.2 % 1.9 (L)     Hgb F Quant 0.0 - 2.0 % 0.0     Hgb S Quant 0.0 % 0.0     Hgb A 96.8 - 97.8 % 98.1 (H)     Hemoglobin Other 0.0 % 0.0     Comments: (NOTE) Interpretation -------------- Normal study. Reviewed by Odis Hollingshead, MD, PhD, FCAP (Electronic Signature on File) Performed at [facility]    Specimen Collected: 09/15/13  3:35 PM Last Resulted: 09/17/13  1:48 PM                     PM Last Resulted: 09/16/13  3:12 AM                         JAK2 GenotypR Not Detected     Comments: (NOTE) Clinical Utility: The somatic acquired mutation affecting Janus Tyrosine Kinase 2 (JAK2 V617F) in exon 14 is associated with myeloproliferative disorders (MPD).  JAK2 V617F has been found to be the most common molecular abnormality in patients with Polycythemia Vera (PV, >90%) or Essential Thombocythemia (ET, 35% - 70%).  The lowest frequency is found in IMF patients (chronic Idiopathic Myelofibrosis, 50%).  The presence of  the JAK2 mutation causes activation of molecular signals that lead to proliferation of hematopoietic precursors outside of their normal pathways including erythropoietin-independent erythroid colony growth in most patients with PV and some patients with ET.  The JAK2 mutation is considered the main oncogenic event responsible for PV development but its precise role in ET and IMF remains questionable and may suggest the requirement of other genetic events to induce these pathologies.  The absence of JAK2 V617F does not exclude other changes, including in the exon 12. Test Methodology: Patient DNA is assayed using allele specific PCR technology from [employer] and is tested using the Roche Light Cycler Real Time PCR analyzer. This assay is reported as detected when >5% of cells show the presence of the JAK2 V617F mutation. This test was performed using a kit that has not been cleared or approved by the FDA.  The analytical  performance characteristics of this test have been determined by [employer].  This test may not be used for diagnostic, prognostic or monitoring purposes without confirmation by other medically established means. Performed at [facility]  Specimen Collected: 09/15/13  3:35 PM Last Resulted: 09/20/13  3:01 PM                 Bcr/abl gene rearrangement qnt, PCR  Status: Final result     Visible to patient: This result is not viewable by the patient.     Next appt: None     Dx: Anemia              Ref Range 51moago   BCR ABL1 / ABL1 % 0.000     BCR ABL1 / ABL1 IS % 0.000     Interpretation - BCRQ   REPORT     Comments: (NOTE) The P190 and P210 BCR-ABL1 fusion transcripts are NOT detected. Reverse transcription real-time PCR is performed for the P190 and P210 BCR-ABL1 transcripts associated with the t(9;22) chromosomal translocation. For P190, results are expressed as a percent ratio of BCR-ABL1 to the ABL1  transcript, and further adjusted to the international scale (IS) for P210. Assay sensitivity is dependent on RNA quality and sample cellularity but is usually at least 4-logs below BCR-ABL1 baseline transcript levels. Reference range is 0.000% BCR-ABL1/ABL1. This test was developed and its performance characteristics have been determined by [employer] [facility] [facility] Performance characteristics refer to the analytical performance of the test.                             QBrett Fairy M.D., [facility] DOdeboltDirector, Molecular Oncology. Performed at [email or web address]   Specimen Collected: 09/15/13  3:35 PM Last Resulted: 09/18/13  3:28 PM                            PATHOLOGY: Hypochromic microcytic cells with normal hemoglobin electrophoresis..  Urinalysis No results found for this basename: colorurine,  appearanceur,  labspec,  phurine,  glucoseu,  hgbur,  bilirubinur,  ketonesur,  proteinur,  urobilinogen,  nitrite,  leukocytesur    RADIOGRAPHIC STUDIES: No results found.  ASSESSMENT:  #1. Iron deficiency anemia, status post intravenous iron with no evidence of bone marrow disorder or hemoglobinopathy. #2. 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 #3. Diabetes mellitus, type II, non-insulin requiring.   PLAN:  #1. No further intervention at this time. #2. Office visit in 6 months with CBC and ferritin.   All questions were answered. The patient knows to call the clinic with any problems, questions or concerns. We can certainly see the patient much sooner if necessary.   I spent 25 minutes counseling the patient face to face. The total time spent in the appointment was 30 minutes.    FDoroteo Bradford MD 10/28/2013 2:50 PM

## 2014-03-11 ENCOUNTER — Encounter: Payer: Self-pay | Admitting: Gastroenterology

## 2014-04-29 ENCOUNTER — Other Ambulatory Visit (HOSPITAL_COMMUNITY): Payer: Medicaid Other

## 2014-04-29 ENCOUNTER — Ambulatory Visit (HOSPITAL_COMMUNITY): Payer: Medicaid Other

## 2014-05-04 ENCOUNTER — Encounter (HOSPITAL_COMMUNITY): Payer: Self-pay

## 2014-05-04 ENCOUNTER — Encounter (HOSPITAL_BASED_OUTPATIENT_CLINIC_OR_DEPARTMENT_OTHER): Payer: Medicaid Other

## 2014-05-04 ENCOUNTER — Encounter (HOSPITAL_COMMUNITY): Payer: Medicaid Other | Attending: Hematology and Oncology

## 2014-05-04 VITALS — BP 97/67 | HR 108 | Temp 97.9°F | Resp 18 | Wt 146.8 lb

## 2014-05-04 DIAGNOSIS — D509 Iron deficiency anemia, unspecified: Secondary | ICD-10-CM

## 2014-05-04 DIAGNOSIS — E119 Type 2 diabetes mellitus without complications: Secondary | ICD-10-CM

## 2014-05-04 DIAGNOSIS — G20A1 Parkinson's disease without dyskinesia, without mention of fluctuations: Secondary | ICD-10-CM | POA: Insufficient documentation

## 2014-05-04 DIAGNOSIS — E611 Iron deficiency: Secondary | ICD-10-CM

## 2014-05-04 DIAGNOSIS — F200 Paranoid schizophrenia: Secondary | ICD-10-CM | POA: Diagnosis not present

## 2014-05-04 DIAGNOSIS — K219 Gastro-esophageal reflux disease without esophagitis: Secondary | ICD-10-CM | POA: Diagnosis not present

## 2014-05-04 DIAGNOSIS — G20 Parkinson's disease: Secondary | ICD-10-CM | POA: Diagnosis not present

## 2014-05-04 DIAGNOSIS — Z23 Encounter for immunization: Secondary | ICD-10-CM

## 2014-05-04 DIAGNOSIS — F209 Schizophrenia, unspecified: Secondary | ICD-10-CM

## 2014-05-04 LAB — CBC WITH DIFFERENTIAL/PLATELET
Basophils Absolute: 0 10*3/uL (ref 0.0–0.1)
Basophils Relative: 0 % (ref 0–1)
Eosinophils Absolute: 0 10*3/uL (ref 0.0–0.7)
Eosinophils Relative: 0 % (ref 0–5)
HCT: 34.5 % — ABNORMAL LOW (ref 36.0–46.0)
Hemoglobin: 10.9 g/dL — ABNORMAL LOW (ref 12.0–15.0)
LYMPHS ABS: 2.3 10*3/uL (ref 0.7–4.0)
Lymphocytes Relative: 24 % (ref 12–46)
MCH: 23 pg — ABNORMAL LOW (ref 26.0–34.0)
MCHC: 31.6 g/dL (ref 30.0–36.0)
MCV: 72.8 fL — AB (ref 78.0–100.0)
MONOS PCT: 11 % (ref 3–12)
Monocytes Absolute: 1.1 10*3/uL — ABNORMAL HIGH (ref 0.1–1.0)
Neutro Abs: 6.2 10*3/uL (ref 1.7–7.7)
Neutrophils Relative %: 65 % (ref 43–77)
Platelets: 293 10*3/uL (ref 150–400)
RBC: 4.74 MIL/uL (ref 3.87–5.11)
RDW: 17.3 % — ABNORMAL HIGH (ref 11.5–15.5)
WBC: 9.6 10*3/uL (ref 4.0–10.5)

## 2014-05-04 LAB — FERRITIN: FERRITIN: 108 ng/mL (ref 10–291)

## 2014-05-04 MED ORDER — INFLUENZA VAC SPLIT QUAD 0.5 ML IM SUSY
0.5000 mL | PREFILLED_SYRINGE | Freq: Once | INTRAMUSCULAR | Status: AC
  Administered 2014-05-04: 0.5 mL via INTRAMUSCULAR
  Filled 2014-05-04: qty 0.5

## 2014-05-04 NOTE — Progress Notes (Signed)
Patient given flu shot to left deltoid.  Tolerated well.

## 2014-05-04 NOTE — Progress Notes (Signed)
LABS FOR CBCD,FERR  

## 2014-05-04 NOTE — Progress Notes (Signed)
[facility]  OFFICE PROGRESS Jolee Ewing, MD [address] [place] [address]  DIAGNOSIS: Iron deficiency - Plan: CBC with Differential, Ferritin, CBC with Differential, Ferritin  Paranoid schizophrenia  Chief Complaint  Patient presents with  . Iron deficiency    CURRENT THERAPY: Feraheme last on 10/22/2013  INTERVAL HISTORY: Mary Wells 58 y.o. female returns for followup of iron deficiency anemia having received intravenous iron on 10/22/2013. Denies abdominal pain, nausea, vomiting, melena, hematochezia, hematuria, or vaginal bleeding. Last tremulousness with new anti-psychotic medication. No cough, wheezing, sore throat, headache, lower extremity swelling or redness, skin rash, or seizures. Currently taking Prolixin plus Cogentin for undifferentiated schizophrenia.    MEDICAL HISTORY: Past Medical History  Diagnosis Date  . Schizophrenia   . GERD (gastroesophageal reflux disease)   . Allergy   . Diabetes mellitus without complication     INTERIM HISTORY: has Diabetes mellitus; Schizophrenia; and Iron deficiency on her problem list.    ALLERGIES:  has No Known Allergies.  MEDICATIONS: has a current medication list which includes the following prescription(s): benztropine, clonazepam, divalproex, docusate sodium, famotidine, loratadine, metformin, psyllium, and fluphenazine.  SURGICAL HISTORY:  Past Surgical History  Procedure Laterality Date  . Colonoscopy  04/21/2006    SLF: limited colonoscopy due to a poor bowel prep   . Colonoscopy  02/22/2009    SLF: poor bowel prep/Formed stools in the right colon which limited the extent of the exam/The scope was passed to approximately the proximal transverse colon.  No polyps, masses, inflammatory changes, diverticular/normal rectum    FAMILY HISTORY: family history is not on file.  SOCIAL HISTORY:  reports that she has quit smoking. She does not have any  smokeless tobacco history on file. She reports that she does not drink alcohol or use illicit drugs.  REVIEW OF SYSTEMS:  Other than that discussed above is noncontributory.  PHYSICAL EXAMINATION: ECOG PERFORMANCE STATUS: 1 - Symptomatic but completely ambulatory  Blood pressure 97/67, pulse 108, temperature 97.9 F (36.6 C), temperature source Oral, resp. rate 18, weight 146 lb 12.8 oz (66.588 kg), SpO2 100.00%.  GENERAL:alert, no distress and comfortable SKIN: skin color, texture, turgor are normal, no rashes or significant lesions EYES: PERLA; Conjunctiva are pink and non-injected, sclera clear SINUSES: No redness or tenderness over maxillary or ethmoid sinuses OROPHARYNX:no exudate, no erythema on lips, buccal mucosa, or tongue. NECK: supple, thyroid normal size, non-tender, without nodularity. No masses CHEST: Normal AP diameter with no breast masses. LYMPH:  no palpable lymphadenopathy in the cervical, axillary or inguinal LUNGS: clear to auscultation and percussion with normal breathing effort HEART: regular rate & rhythm and no murmurs. ABDOMEN:abdomen soft, non-tender and normal bowel sounds MUSCULOSKELETAL:no cyanosis of digits and no clubbing. Range of motion normal.  NEURO: alert & oriented x 3 with fluent speech, no focal motor/sensory deficits. Answers simple questions appropriately. Minimal tremors at rest. No rigidity. No evidence of tardive dyskinesia.   LABORATORY DATA: Office Visit on 05/04/2014  Component Date Value Ref Range Status  . WBC 05/04/2014 9.6  4.0 - 10.5 K/uL Final  . RBC 05/04/2014 4.74  3.87 - 5.11 MIL/uL Final  . Hemoglobin 05/04/2014 10.9* 12.0 - 15.0 g/dL Final  . HCT 05/04/2014 34.5* 36.0 - 46.0 % Final  . MCV 05/04/2014 72.8* 78.0 - 100.0 fL Final  . MCH 05/04/2014 23.0* 26.0 - 34.0 pg Final  . MCHC 05/04/2014 31.6  30.0 - 36.0 g/dL Final  .  RDW 05/04/2014 17.3* 11.5 - 15.5 % Final  . Platelets 05/04/2014 293  150 - 400 K/uL Final  .  Neutrophils Relative % 05/04/2014 65  43 - 77 % Final  . Neutro Abs 05/04/2014 6.2  1.7 - 7.7 K/uL Final  . Lymphocytes Relative 05/04/2014 24  12 - 46 % Final  . Lymphs Abs 05/04/2014 2.3  0.7 - 4.0 K/uL Final  . Monocytes Relative 05/04/2014 11  3 - 12 % Final  . Monocytes Absolute 05/04/2014 1.1* 0.1 - 1.0 K/uL Final  . Eosinophils Relative 05/04/2014 0  0 - 5 % Final  . Eosinophils Absolute 05/04/2014 0.0  0.0 - 0.7 K/uL Final  . Basophils Relative 05/04/2014 0  0 - 1 % Final  . Basophils Absolute 05/04/2014 0.0  0.0 - 0.1 K/uL Final    PATHOLOGY: No new pathology.  Urinalysis No results found for this basename: colorurine,  appearanceur,  labspec,  phurine,  glucoseu,  hgbur,  bilirubinur,  ketonesur,  proteinur,  urobilinogen,  nitrite,  leukocytesur    RADIOGRAPHIC STUDIES: No results found.  ASSESSMENT:  #1. Iron deficiency anemia, status post intravenous iron with no evidence of bone marrow disorder or hemoglobinopathy, stable #2. 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  #3. Diabetes mellitus, type II, non-insulin requiring    PLAN:  #1. Influenza virus vaccine was given today. #2. If ferritin is low, arrangements will be made for intravenous iron. #3. Followup in 3 months with CBC, ferritin   All questions were answered. The patient knows to call the clinic with any problems, questions or concerns. We can certainly see the patient much sooner if necessary.   I spent 25 minutes counseling the patient face to face. The total time spent in the appointment was 30 minutes.    [name], MD 05/04/2014 12:01 PM  DISCLAIMER:  This note was dictated with voice recognition software.  Similar sounding words can inadvertently be transcribed inaccurately and may not be corrected upon review.

## 2014-05-04 NOTE — Patient Instructions (Signed)
[facility] Discharge Instructions  RECOMMENDATIONS MADE BY THE CONSULTANT AND ANY TEST RESULTS WILL BE SENT TO YOUR REFERRING PHYSICIAN.  We will see you back in 3 months for repeat lab work and a [place] appointment. You were given your flu shot today.  Information sheet provided. Please call for any questions or concerns.   Thank you for choosing [facility] to provide your oncology and hematology care.  To afford each patient quality time with our providers, please arrive at least 15 minutes before your scheduled appointment time.  With your help, our goal is to use those 15 minutes to complete the necessary work-up to ensure our physicians have the information they need to help with your evaluation and healthcare recommendations.    Effective January 1st, 2014, we ask that you re-schedule your appointment with our physicians should you arrive 10 or more minutes late for your appointment.  We strive to give you quality time with our providers, and arriving late affects you and other patients whose appointments are after yours.    Again, thank you for choosing [facility].  Our hope is that these requests will decrease the amount of time that you wait before being seen by our physicians.       _____________________________________________________________  Should you have questions after your visit to [facility], please contact our office at [phone] between the hours of 8:30 a.m. and 4:30 p.m.  Voicemails left after 4:30 p.m. will not be returned until the following business day.  For prescription refill requests, have your pharmacy contact our office with your prescription refill request.    _______________________________________________________________  We hope that we have given you very good care.  You may receive a patient satisfaction survey in the mail, please complete it and return it as soon as possible.  We value  your feedback!  _______________________________________________________________  Have you asked about our STAR program?  STAR stands for Survivorship Training and Rehabilitation, and this is a nationally recognized cancer care program that focuses on survivorship and rehabilitation.  Cancer and cancer treatments may cause problems, such as, pain, making you feel tired and keeping you from doing the things that you need or want to do. Cancer rehabilitation can help. Our goal is to reduce these troubling effects and help you have the best quality of life possible.  You may receive a survey from a nurse that asks questions about your current state of health.  Based on the survey results, all eligible patients will be referred to the [facility] program for an evaluation so we can better serve you!  A frequently asked questions sheet is available upon request.

## 2014-05-26 ENCOUNTER — Other Ambulatory Visit: Payer: Self-pay | Admitting: Obstetrics and Gynecology

## 2014-06-02 ENCOUNTER — Other Ambulatory Visit: Payer: Self-pay | Admitting: Obstetrics and Gynecology

## 2014-06-16 ENCOUNTER — Other Ambulatory Visit: Payer: Medicaid Other | Admitting: Obstetrics and Gynecology

## 2014-08-03 ENCOUNTER — Other Ambulatory Visit (HOSPITAL_COMMUNITY): Payer: Medicaid Other

## 2014-08-03 ENCOUNTER — Ambulatory Visit (HOSPITAL_COMMUNITY): Payer: Medicaid Other | Admitting: Hematology & Oncology

## 2014-08-06 NOTE — Progress Notes (Signed)
[place], MD [place] [address]  Iron deficiency - Plan: CBC with Differential, Ferritin, Iron and TIBC, CBC with Differential, Iron and TIBC, Ferritin, Anti-parietal antibody, Intrinsic Factor Antibodies  CURRENT THERAPY: Feraheme last given on 10/22/2013  INTERVAL HISTORY: Mary Wells 59 y.o. female returns for followup of iron deficiency anemia with no evidence of bone marrow disorder or hemoglobinopathy having received intravenous iron on 10/22/2013.   I personally reviewed and went over laboratory results with the patient.  The results are noted within this dictation.  She denies any blood in her stool, black tarry stool, hematuria, hemoptysis, gingival bleeding, or any other sources of bleeding.  Labs from today are pending at this time.  Hematologically, the patient denies any complaints and review of systems questioning is negative.  Past Medical History  Diagnosis Date  . Schizophrenia   . GERD (gastroesophageal reflux disease)   . Allergy   . Diabetes mellitus without complication     has Diabetes mellitus; Schizophrenia; and Iron deficiency on her problem list.     has No Known Allergies.  Mary Wells does not currently have medications on file.  Past Surgical History  Procedure Laterality Date  . Colonoscopy  04/21/2006    SLF: limited colonoscopy due to a poor bowel prep   . Colonoscopy  02/22/2009    SLF: poor bowel prep/Formed stools in the right colon which limited the extent of the exam/The scope was passed to approximately the proximal transverse colon.  No polyps, masses, inflammatory changes, diverticular/normal rectum    Denies any headaches, dizziness, double vision, fevers, chills, night sweats, nausea, vomiting, diarrhea, constipation, chest pain, heart palpitations, shortness of breath, blood in stool, black tarry stool, urinary pain, urinary burning, urinary frequency, hematuria.   PHYSICAL  EXAMINATION  ECOG PERFORMANCE STATUS: 1 - Symptomatic but completely ambulatory  Filed Vitals:   08/09/14 1308  BP: 121/65  Pulse: 95  Temp: 97.7 F (36.5 C)  Resp: 16    GENERAL:alert, no distress, well nourished, well developed, comfortable, cooperative and smiling SKIN: skin color, texture, turgor are normal, no rashes or significant lesions HEAD: Normocephalic, No masses, lesions, tenderness or abnormalities EYES: normal, PERRLA, EOMI, Conjunctiva are pink and non-injected EARS: External ears normal OROPHARYNX:lips, buccal mucosa, and tongue normal and mucous membranes are moist  NECK: supple, thyroid normal size, non-tender, without nodularity, no stridor, non-tender, trachea midline LYMPH:  no palpable lymphadenopathy BREAST:not examined LUNGS: clear to auscultation  HEART: regular rate & rhythm ABDOMEN:abdomen soft and normal bowel sounds BACK: No CVA tenderness EXTREMITIES:no joint deformities, effusion, or inflammation, no skin discoloration  NEURO: alert & oriented x 3 with fluent speech, no focal motor/sensory deficits, gait normal, mentally handicapped   LABORATORY DATA: CBC    Component Value Date/Time   WBC 9.6 05/04/2014 1016   RBC 4.74 05/04/2014 1016   RBC 4.78 09/15/2013 1535   HGB 10.9* 05/04/2014 1016   HCT 34.5* 05/04/2014 1016   PLT 293 05/04/2014 1016   MCV 72.8* 05/04/2014 1016   MCH 23.0* 05/04/2014 1016   MCHC 31.6 05/04/2014 1016   RDW 17.3* 05/04/2014 1016   LYMPHSABS 2.3 05/04/2014 1016   MONOABS 1.1* 05/04/2014 1016   EOSABS 0.0 05/04/2014 1016   BASOSABS 0.0 05/04/2014 1016      Chemistry      Component Value Date/Time   NA 142 09/15/2013 1535   K 4.1 09/15/2013 1535   CL 103 09/15/2013 1535  CO2 25 09/15/2013 1535   BUN 12 09/15/2013 1535   CREATININE 0.91 09/15/2013 1535      Component Value Date/Time   CALCIUM 9.7 09/15/2013 1535   ALKPHOS 93 09/15/2013 1535   AST 17 09/15/2013 1535   ALT 10 09/15/2013 1535   BILITOT  <0.2* 09/15/2013 1535     Lab Results  Component Value Date   IRON 18* 09/15/2013   TIBC 349 09/15/2013   FERRITIN 108 05/04/2014    ASSESSMENT AND PLAN:  Iron deficiency No evidence of bone marrow disorder or hemoglobinopathy.  Labs today: CBC diff, Iron/TIBC, ferritin, anti-parietal cell antibody and intrinsic factor antibody.  Labs in 3 months and 6 months: CBC diff, iron/TIBC, ferritin.  Return in 6 months for follow-up   THERAPY PLAN:  We will continue to monitor iron studies and administer IV iron as needed.  All questions were answered. The patient knows to call the clinic with any problems, questions or concerns. We can certainly see the patient much sooner if necessary.  Patient and plan discussed with Dr. Ancil Linsey and she is in agreement with the aforementioned.   KEFALAS,THOMAS 08/09/2014

## 2014-08-06 NOTE — Assessment & Plan Note (Signed)
No evidence of bone marrow disorder or hemoglobinopathy.  Labs today: CBC diff, Iron/TIBC, ferritin, anti-parietal cell antibody and intrinsic factor antibody.  Labs in 3 months and 6 months: CBC diff, iron/TIBC, ferritin.  Return in 6 months for follow-up

## 2014-08-09 ENCOUNTER — Ambulatory Visit (HOSPITAL_COMMUNITY): Payer: Medicaid Other | Admitting: Hematology & Oncology

## 2014-08-09 ENCOUNTER — Encounter (HOSPITAL_COMMUNITY): Payer: Medicaid Other | Attending: Hematology & Oncology

## 2014-08-09 ENCOUNTER — Ambulatory Visit (HOSPITAL_COMMUNITY): Payer: Medicaid Other | Admitting: Oncology

## 2014-08-09 ENCOUNTER — Encounter (HOSPITAL_BASED_OUTPATIENT_CLINIC_OR_DEPARTMENT_OTHER): Payer: Medicaid Other | Admitting: Oncology

## 2014-08-09 ENCOUNTER — Encounter (HOSPITAL_COMMUNITY): Payer: Self-pay | Admitting: Oncology

## 2014-08-09 VITALS — BP 121/65 | HR 95 | Temp 97.7°F | Resp 16 | Wt 151.0 lb

## 2014-08-09 DIAGNOSIS — D509 Iron deficiency anemia, unspecified: Secondary | ICD-10-CM | POA: Insufficient documentation

## 2014-08-09 DIAGNOSIS — F209 Schizophrenia, unspecified: Secondary | ICD-10-CM | POA: Diagnosis not present

## 2014-08-09 DIAGNOSIS — E611 Iron deficiency: Secondary | ICD-10-CM

## 2014-08-09 DIAGNOSIS — K219 Gastro-esophageal reflux disease without esophagitis: Secondary | ICD-10-CM | POA: Diagnosis not present

## 2014-08-09 DIAGNOSIS — E119 Type 2 diabetes mellitus without complications: Secondary | ICD-10-CM | POA: Diagnosis not present

## 2014-08-09 LAB — FERRITIN: Ferritin: 60 ng/mL (ref 10–291)

## 2014-08-09 LAB — CBC WITH DIFFERENTIAL/PLATELET
BASOS ABS: 0 10*3/uL (ref 0.0–0.1)
BASOS PCT: 0 % (ref 0–1)
Eosinophils Absolute: 0.1 10*3/uL (ref 0.0–0.7)
Eosinophils Relative: 1 % (ref 0–5)
HCT: 31.8 % — ABNORMAL LOW (ref 36.0–46.0)
Hemoglobin: 9.8 g/dL — ABNORMAL LOW (ref 12.0–15.0)
LYMPHS PCT: 27 % (ref 12–46)
Lymphs Abs: 2.7 10*3/uL (ref 0.7–4.0)
MCH: 22.5 pg — ABNORMAL LOW (ref 26.0–34.0)
MCHC: 30.8 g/dL (ref 30.0–36.0)
MCV: 73.1 fL — ABNORMAL LOW (ref 78.0–100.0)
MONOS PCT: 9 % (ref 3–12)
Monocytes Absolute: 0.9 10*3/uL (ref 0.1–1.0)
Neutro Abs: 6.4 10*3/uL (ref 1.7–7.7)
Neutrophils Relative %: 63 % (ref 43–77)
Platelets: 375 10*3/uL (ref 150–400)
RBC: 4.35 MIL/uL (ref 3.87–5.11)
RDW: 17.8 % — AB (ref 11.5–15.5)
WBC Morphology: INCREASED
WBC: 10.1 10*3/uL (ref 4.0–10.5)

## 2014-08-09 LAB — IRON AND TIBC
Iron: 13 ug/dL — ABNORMAL LOW (ref 42–145)
Saturation Ratios: 5 % — ABNORMAL LOW (ref 20–55)
TIBC: 237 ug/dL — AB (ref 250–470)
UIBC: 224 ug/dL (ref 125–400)

## 2014-08-09 NOTE — Progress Notes (Signed)
LABS FOR IFA,APARIETAL,FERR,IRON/TIBC,CBCD

## 2014-08-09 NOTE — Patient Instructions (Signed)
[facility] Discharge Instructions  RECOMMENDATIONS MADE BY THE CONSULTANT AND ANY TEST RESULTS WILL BE SENT TO YOUR REFERRING PHYSICIAN.  Labs are pending at the time of your appointment.  We will call you with those results when they are resulted back to [place] at the [facility].  Some labs we will learn about today, other, including your iron tests will be reported tomorrow, 08/10/2014.  We will plan on performed labs in 3 months and 6 months time.  We would be happy to perform labs in between those scheduled lab appointments if needed.  Please call if necessary. Please call the [facility] with any questions or concerns related to your iron deficiency anemia.   Happy New Year   Thank you for choosing [facility] to provide your oncology and hematology care.  To afford each patient quality time with our providers, please arrive at least 15 minutes before your scheduled appointment time.  With your help, our goal is to use those 15 minutes to complete the necessary work-up to ensure our physicians have the information they need to help with your evaluation and healthcare recommendations.    Effective January 1st, 2014, we ask that you re-schedule your appointment with our physicians should you arrive 10 or more minutes late for your appointment.  We strive to give you quality time with our providers, and arriving late affects you and other patients whose appointments are after yours.    Again, thank you for choosing [facility].  Our hope is that these requests will decrease the amount of time that you wait before being seen by our physicians.       _____________________________________________________________  Should you have questions after your visit to [facility], please contact our office at [phone] between the hours of 8:30 a.m. and 5:00 p.m.  Voicemails left after 4:30 p.m. will not be returned until  the following business day.  For prescription refill requests, have your pharmacy contact our office with your prescription refill request.     Iron Deficiency Anemia Anemia is a condition in which there are less red blood cells or hemoglobin in the blood than normal. Hemoglobin is the part of red blood cells that carries oxygen. Iron deficiency anemia is anemia caused by too little iron. It is the most common type of anemia. It may leave you tired and short of breath. CAUSES   Lack of iron in the diet.  Poor absorption of iron, as seen with intestinal disorders.  Intestinal bleeding.  Heavy periods. SIGNS AND SYMPTOMS  Mild anemia may not be noticeable. Symptoms may include:  Fatigue.  Headache.  Pale skin.  Weakness.  Tiredness.  Shortness of breath.  Dizziness.  Cold hands and feet.  Fast or irregular heartbeat. DIAGNOSIS  Diagnosis requires a thorough evaluation and physical exam by your health care provider. Blood tests are generally used to confirm iron deficiency anemia. Additional tests may be done to find the underlying cause of your anemia. These may include:  Testing for blood in the stool (fecal occult blood test).  A procedure to see inside the colon and rectum (colonoscopy).  A procedure to see inside the esophagus and stomach (endoscopy). TREATMENT  Iron deficiency anemia is treated by correcting the cause of the deficiency. Treatment may involve:  Adding iron-rich foods to your diet.  Taking iron supplements. Pregnant or breastfeeding women need to take extra iron because their normal  diet usually does not provide the required amount.  Taking vitamins. Vitamin C improves the absorption of iron. Your health care provider may recommend that you take your iron tablets with a glass of orange juice or vitamin C supplement.  Medicines to make heavy menstrual flow lighter.  Surgery. HOME CARE INSTRUCTIONS   Take iron as directed by your health care  provider.  If you cannot tolerate taking iron supplements by mouth, talk to your health care provider about taking them through a vein (intravenously) or an injection into a muscle.  For the best iron absorption, iron supplements should be taken on an empty stomach. If you cannot tolerate them on an empty stomach, you may need to take them with food.  Do not drink milk or take antacids at the same time as your iron supplements. Milk and antacids may interfere with the absorption of iron.  Iron supplements can cause constipation. Make sure to include fiber in your diet to prevent constipation. A stool softener may also be recommended.  Take vitamins as directed by your health care provider.  Eat a diet rich in iron. Foods high in iron include liver, lean beef, whole-grain bread, eggs, dried fruit, and dark green leafy vegetables. SEEK IMMEDIATE MEDICAL CARE IF:   You faint. If this happens, do not drive. Call your local emergency services (911 in [place] if no other help is available.  You have chest pain.  You feel nauseous or vomit.  You have severe or increased shortness of breath with activity.  You feel weak.  You have a rapid heartbeat.  You have unexplained sweating.  You become light-headed when getting up from a chair or bed. MAKE SURE YOU:   Understand these instructions.  Will watch your condition.  Will get help right away if you are not doing well or get worse. Document Released: 07/19/2000 Document Revised: 07/27/2013 Document Reviewed: 03/29/2013 [facility] Patient Information 2015 [place], [place]. This information is not intended to replace advice given to you by your health care provider. Make sure you discuss any questions you have with your health care provider.

## 2014-08-10 ENCOUNTER — Other Ambulatory Visit (HOSPITAL_COMMUNITY): Payer: Self-pay | Admitting: Oncology

## 2014-08-10 DIAGNOSIS — E611 Iron deficiency: Secondary | ICD-10-CM

## 2014-08-11 ENCOUNTER — Encounter (HOSPITAL_COMMUNITY): Payer: Self-pay

## 2014-08-11 LAB — ANTI-PARIETAL ANTIBODY: Parietal Cell Antibody-IgG: NEGATIVE

## 2014-08-11 LAB — INTRINSIC FACTOR ANTIBODIES: INTRINSIC FACTOR: NEGATIVE

## 2014-11-08 ENCOUNTER — Other Ambulatory Visit (HOSPITAL_COMMUNITY): Payer: Medicaid Other

## 2015-02-07 ENCOUNTER — Other Ambulatory Visit (HOSPITAL_COMMUNITY): Payer: Medicaid Other

## 2015-02-07 ENCOUNTER — Ambulatory Visit (HOSPITAL_COMMUNITY): Payer: Medicaid Other | Admitting: Hematology & Oncology

## 2015-02-10 ENCOUNTER — Encounter (HOSPITAL_COMMUNITY): Payer: Self-pay

## 2015-02-14 ENCOUNTER — Other Ambulatory Visit (HOSPITAL_COMMUNITY): Payer: Self-pay | Admitting: Internal Medicine

## 2015-02-14 DIAGNOSIS — Z1231 Encounter for screening mammogram for malignant neoplasm of breast: Secondary | ICD-10-CM

## 2015-02-19 NOTE — Progress Notes (Signed)
This encounter was created in error - please disregard.

## 2015-02-24 ENCOUNTER — Ambulatory Visit (HOSPITAL_COMMUNITY)
Admission: RE | Admit: 2015-02-24 | Discharge: 2015-02-24 | Disposition: A | Discharge status: Home / Self Care | Payer: Medicaid Other | Source: Ambulatory Visit | Attending: Internal Medicine | Admitting: Internal Medicine

## 2015-02-24 DIAGNOSIS — Z1231 Encounter for screening mammogram for malignant neoplasm of breast: Secondary | ICD-10-CM | POA: Diagnosis present

## 2015-10-12 ENCOUNTER — Encounter (HOSPITAL_COMMUNITY): Payer: Self-pay | Admitting: Oncology

## 2015-10-12 NOTE — Assessment & Plan Note (Addendum)
Iron deficiency anemia, S/P IV feraheme on 10/22/2013 with noncompliance to subsequent follow-up appointment having last been seen on 08/06/2014; now being referred back to [facility] from primary care provider with recent lab work demonstrating iron deficiency anemia.  She has documented noncompliance with GI. Her last colonoscopy was performed by Dr. Oneida Alar in 2012 and was suboptimal due to poor prep, but no identified abnormalities appreciated.  Oncology Flowsheet 10/22/2013  ferumoxytol [facility]) IV 1,020 mg   Based upon lab work from Dr. Legrand Rams, she is scheduled to receive 750 mg of Injectafer today.  Labs from PCP office on 09/26/2015 demonstrate a HGB of 7.6 g/dL that is microcytic, hypochromic, and an elevated RDW and a reactively elevated platelet count.  Ferritin was 7.  Based upon these labs, iron deficit calculation provides a deficit of ~ 650 mg using LLN HGB of 12 g/dL.  Since being seen in Jan 2016, she has not returned for repeat labs scheduled on 11/08/2014 and 02/07/2015 with a no-show for her 02/07/2015 follow-up appointment.  Anemia work-up today: CBC diff, CMET, LDH, ESR, CRP, anemia panel, EPO level, haptoglobin, Retic count, TSH, stool cards x 3, and peripheral smear review.  Labs in 5-7 days: CBC.   Labs in 3 weeks: CBC, iron/TIBC, ferritin.  Return in 3 weeks for follow-up.

## 2015-10-12 NOTE — Progress Notes (Signed)
[place], MD [place] [address]  Iron deficiency anemia - Plan: CBC with Differential, Comprehensive metabolic panel, Lactate dehydrogenase, Sedimentation rate, Vitamin B12, Folate, Iron and TIBC, Ferritin, Erythropoietin, Haptoglobin, Reticulocytes, TSH, C-reactive protein, Occult blood card to lab, stool, Occult blood card to lab, stool, Occult blood card to lab, stool, Pathologist smear review, CBC, Iron and TIBC, Ferritin, CBC  CURRENT THERAPY: IV iron replacement  INTERVAL HISTORY: Mary Wells 60 y.o. female returns for followup of iron deficiency anemia with no evidence of bone marrow disorder or hemoglobinopathy having received intravenous iron on 10/22/2013 with patient failure to follow-up as scheduled, last seen on 08/06/2014.  She has documented noncompliance with GI follow-up as well.  Her last colonoscopy was on 12/04/2010 by Dr. Oneida Alar that demonstrated a limited exam due to poor prep, no polyps, masses, inflammatory changes, diverticular, or AVMs were identified at that time.  Repeat colonoscopy 5 years later was recommended with a 2 day bowel prep under direct supervision.  I personally reviewed and went over laboratory results with the patient.  The results are noted within this dictation.  Labs were performed by her primary care physician, Dr. Legrand Rams demonstrating frank iron deficiency anemia.  The patient denies any chest pains, shortness of breath, dyspnea on exertion, severe fatigue, pica, take aphasia, pagophasia.  The patient is accompanied by her daughter and granddaughter. The patient denies any blood in her stool, black sticky stool, hematuria, vaginal bleeding, or any other blood loss.  Chart is reviewed,  Given her laboratory work by Dr. Legrand Rams, she is a candidate for ferric carboxymaltose, 750 mg.  We will administer this today.  Past Medical History  Diagnosis Date  . Schizophrenia ([place])   . GERD (gastroesophageal reflux  disease)   . Allergy   . Diabetes mellitus without complication ([place])   . Iron deficiency anemia 09/18/2013    has Diabetes mellitus ([place]); Schizophrenia ([place]); and Iron deficiency anemia on her problem list.     has No Known Allergies.  We administered Ferric Carboxymaltose 750 mg in sodium chloride 0.9 % 100 mL IVPB.  Past Surgical History  Procedure Laterality Date  . Colonoscopy  04/21/2006    SLF: limited colonoscopy due to a poor bowel prep   . Colonoscopy  02/22/2009    SLF: poor bowel prep/Formed stools in the right colon which limited the extent of the exam/The scope was passed to approximately the proximal transverse colon.  No polyps, masses, inflammatory changes, diverticular/normal rectum    Denies any headaches, dizziness, double vision, fevers, chills, night sweats, nausea, vomiting, diarrhea, constipation, chest pain, heart palpitations, shortness of breath, blood in stool, black tarry stool, urinary pain, urinary burning, urinary frequency, hematuria.   PHYSICAL EXAMINATION  ECOG PERFORMANCE STATUS: 1 - Symptomatic but completely ambulatory  Filed Vitals:   10/13/15 0944  BP: 97/62  Pulse: 97  Temp: 97.7 F (36.5 C)  Resp: 16    GENERAL:alert, no distress, well nourished, well developed, comfortable, cooperative and smiling, accompanied by her daughter and granddaughter. SKIN: skin color, texture, turgor are normal, no rashes or significant lesions HEAD: Normocephalic, No masses, lesions, tenderness or abnormalities EYES: normal, PERRLA, EOMI, Conjunctiva are pink and non-injected EARS: External ears normal OROPHARYNX:lips, buccal mucosa, and tongue normal and mucous membranes are moist  NECK: supple, thyroid normal size, non-tender, without nodularity, no stridor, non-tender, trachea midline LYMPH:  no palpable lymphadenopathy BREAST:not examined LUNGS: clear to auscultation with normal percussion. No  wheezes, rales, or rhonchi. HEART: regular rate &  rhythm without murmur, rub, or gallop. Normal S1 and S2. ABDOMEN:abdomen soft and normal bowel sounds BACK: No CVA tenderness EXTREMITIES:no joint deformities, effusion, or inflammation, no skin discoloration  NEURO: alert & oriented x 3 with fluent speech, no focal motor/sensory deficits, gait normal, mentally handicapped   LABORATORY DATA: CBC    Component Value Date/Time   WBC 11.4* 10/13/2015 1023   RBC 4.87 10/13/2015 1023   RBC 4.87 10/13/2015 1023   HGB 8.3* 10/13/2015 1023   HCT 28.9* 10/13/2015 1023   PLT 425* 10/13/2015 1023   MCV 59.3* 10/13/2015 1023   MCH 17.0* 10/13/2015 1023   MCHC 28.7* 10/13/2015 1023   RDW 23.1* 10/13/2015 1023   LYMPHSABS 2.1 10/13/2015 1023   MONOABS 1.2* 10/13/2015 1023   EOSABS 0.1 10/13/2015 1023   BASOSABS 0.0 10/13/2015 1023      Chemistry      Component Value Date/Time   NA 140 10/13/2015 1023   K 4.3 10/13/2015 1023   CL 106 10/13/2015 1023   CO2 25 10/13/2015 1023   BUN 11 10/13/2015 1023   CREATININE 0.78 10/13/2015 1023      Component Value Date/Time   CALCIUM 9.0 10/13/2015 1023   ALKPHOS 109 10/13/2015 1023   AST 18 10/13/2015 1023   ALT 11* 10/13/2015 1023   BILITOT 0.2* 10/13/2015 1023     Lab Results  Component Value Date   IRON 13* 08/09/2014   TIBC 237* 08/09/2014   FERRITIN 60 08/09/2014    Labs from PCP office on 09/26/2015 demonstrate a HGB of 7.6 g/dL that is mircrocytic, hypochromic, and an elevated RDW and a reactively elevated platelet count.  Ferritin was 7.    ASSESSMENT AND PLAN:  Iron deficiency anemia Iron deficiency anemia, S/P IV feraheme on 10/22/2013 with noncompliance to subsequent follow-up appointment having last been seen on 08/06/2014; now being referred back to [facility] from primary care provider with recent lab work demonstrating iron deficiency anemia.  She has documented noncompliance with GI. Her last colonoscopy was performed by Dr. Oneida Alar in 2012 and was suboptimal due to poor prep, but  no identified abnormalities appreciated.  Oncology Flowsheet 10/22/2013  ferumoxytol [facility]) IV 1,020 mg   Based upon lab work from Dr. Legrand Rams, she is scheduled to receive 750 mg of Injectafer today.  Labs from PCP office on 09/26/2015 demonstrate a HGB of 7.6 g/dL that is microcytic, hypochromic, and an elevated RDW and a reactively elevated platelet count.  Ferritin was 7.  Based upon these labs, iron deficit calculation provides a deficit of ~ 650 mg using LLN HGB of 12 g/dL.  Since being seen in Jan 2016, she has not returned for repeat labs scheduled on 11/08/2014 and 02/07/2015 with a no-show for her 02/07/2015 follow-up appointment.  Anemia work-up today: CBC diff, CMET, LDH, ESR, CRP, anemia panel, EPO level, haptoglobin, Retic count, TSH, stool cards x 3, and peripheral smear review.  Labs in 5-7 days: CBC.   Labs in 3 weeks: CBC, iron/TIBC, ferritin.  Return in 3 weeks for follow-up.   THERAPY PLAN:  We will continue to monitor iron studies and administer IV iron as indicated.  All questions were answered. The patient knows to call the clinic with any problems, questions or concerns. We can certainly see the patient much sooner if necessary.  Patient and plan discussed with Dr. Ancil Linsey and she is in agreement with the aforementioned.   Geralynn Capri 10/13/2015 11:32 AM

## 2015-10-13 ENCOUNTER — Encounter (HOSPITAL_COMMUNITY): Payer: Self-pay | Admitting: Oncology

## 2015-10-13 ENCOUNTER — Encounter (HOSPITAL_COMMUNITY): Payer: Medicaid Other | Attending: Oncology | Admitting: Oncology

## 2015-10-13 ENCOUNTER — Encounter (HOSPITAL_COMMUNITY): Payer: Medicaid Other

## 2015-10-13 VITALS — BP 112/62 | HR 70 | Temp 97.5°F | Resp 18

## 2015-10-13 VITALS — BP 97/62 | HR 97 | Temp 97.7°F | Resp 16

## 2015-10-13 DIAGNOSIS — F209 Schizophrenia, unspecified: Secondary | ICD-10-CM | POA: Diagnosis not present

## 2015-10-13 DIAGNOSIS — K219 Gastro-esophageal reflux disease without esophagitis: Secondary | ICD-10-CM | POA: Diagnosis not present

## 2015-10-13 DIAGNOSIS — E119 Type 2 diabetes mellitus without complications: Secondary | ICD-10-CM | POA: Diagnosis not present

## 2015-10-13 DIAGNOSIS — D509 Iron deficiency anemia, unspecified: Secondary | ICD-10-CM

## 2015-10-13 DIAGNOSIS — Z9119 Patient's noncompliance with other medical treatment and regimen: Secondary | ICD-10-CM | POA: Insufficient documentation

## 2015-10-13 LAB — IRON AND TIBC
IRON: 16 ug/dL — AB (ref 28–170)
Saturation Ratios: 5 % — ABNORMAL LOW (ref 10.4–31.8)
TIBC: 326 ug/dL (ref 250–450)
UIBC: 310 ug/dL

## 2015-10-13 LAB — COMPREHENSIVE METABOLIC PANEL
ALBUMIN: 2.5 g/dL — AB (ref 3.5–5.0)
ALK PHOS: 109 U/L (ref 38–126)
ALT: 11 U/L — AB (ref 14–54)
ANION GAP: 9 (ref 5–15)
AST: 18 U/L (ref 15–41)
BUN: 11 mg/dL (ref 6–20)
CALCIUM: 9 mg/dL (ref 8.9–10.3)
CO2: 25 mmol/L (ref 22–32)
CREATININE: 0.78 mg/dL (ref 0.44–1.00)
Chloride: 106 mmol/L (ref 101–111)
GFR calc Af Amer: >60 mL/min (ref >60)
GFR calc non Af Amer: >60 mL/min (ref >60)
GLUCOSE: 125 mg/dL — AB (ref 65–99)
Potassium: 4.3 mmol/L (ref 3.5–5.1)
SODIUM: 140 mmol/L (ref 135–145)
Total Bilirubin: 0.2 mg/dL — ABNORMAL LOW (ref 0.3–1.2)
Total Protein: 7.3 g/dL (ref 6.5–8.1)

## 2015-10-13 LAB — CBC WITH DIFFERENTIAL/PLATELET
BASOS PCT: 0 %
Basophils Absolute: 0 10*3/uL (ref 0.0–0.1)
EOS ABS: 0.1 10*3/uL (ref 0.0–0.7)
Eosinophils Relative: 1 %
HCT: 28.9 % — ABNORMAL LOW (ref 36.0–46.0)
HEMOGLOBIN: 8.3 g/dL — AB (ref 12.0–15.0)
Lymphocytes Relative: 19 %
Lymphs Abs: 2.1 10*3/uL (ref 0.7–4.0)
MCH: 17 pg — ABNORMAL LOW (ref 26.0–34.0)
MCHC: 28.7 g/dL — ABNORMAL LOW (ref 30.0–36.0)
MCV: 59.3 fL — ABNORMAL LOW (ref 78.0–100.0)
Monocytes Absolute: 1.2 10*3/uL — ABNORMAL HIGH (ref 0.1–1.0)
Monocytes Relative: 11 %
NEUTROS ABS: 7.9 10*3/uL — AB (ref 1.7–7.7)
Neutrophils Relative %: 69 %
Platelets: 425 10*3/uL — ABNORMAL HIGH (ref 150–400)
RBC: 4.87 MIL/uL (ref 3.87–5.11)
RDW: 23.1 % — ABNORMAL HIGH (ref 11.5–15.5)
WBC: 11.4 10*3/uL — AB (ref 4.0–10.5)

## 2015-10-13 LAB — VITAMIN B12: Vitamin B-12: 444 pg/mL (ref 180–914)

## 2015-10-13 LAB — FOLATE: FOLATE: 28.1 ng/mL (ref >5.9)

## 2015-10-13 LAB — C-REACTIVE PROTEIN: CRP: 8.2 mg/dL — ABNORMAL HIGH (ref <1.0)

## 2015-10-13 LAB — FERRITIN: FERRITIN: 10 ng/mL — AB (ref 11–307)

## 2015-10-13 LAB — SEDIMENTATION RATE: SED RATE: 22 mm/h (ref 0–22)

## 2015-10-13 LAB — LACTATE DEHYDROGENASE: LDH: 98 U/L (ref 98–192)

## 2015-10-13 LAB — RETICULOCYTES
RBC.: 4.87 MIL/uL (ref 3.87–5.11)
Retic Count, Absolute: 87.7 10*3/uL (ref 19.0–186.0)
Retic Ct Pct: 1.8 % (ref 0.4–3.1)

## 2015-10-13 LAB — TSH: TSH: 0.616 u[IU]/mL (ref 0.350–4.500)

## 2015-10-13 MED ORDER — SODIUM CHLORIDE 0.9 % IV SOLN
750.0000 mg | Freq: Once | INTRAVENOUS | Status: AC
  Administered 2015-10-13: 750 mg via INTRAVENOUS
  Filled 2015-10-13: qty 15

## 2015-10-13 MED ORDER — SODIUM CHLORIDE 0.9 % IV SOLN
INTRAVENOUS | Status: DC
  Administered 2015-10-13: 11:00:00 via INTRAVENOUS

## 2015-10-13 NOTE — Progress Notes (Signed)
Patient tolerated infusion well.  VSS.  No s/s reaction throughout.

## 2015-10-13 NOTE — Patient Instructions (Signed)
..  [place] at [facility] Discharge Instructions  RECOMMENDATIONS MADE BY THE CONSULTANT AND ANY TEST RESULTS WILL BE SENT TO YOUR REFERRING PHYSICIAN.  Iron infusion today Your hemoglobin is 7.6- since you are not symptomatic we will not do blood transfusion   Thank you for choosing [place] at [facility] to provide your oncology and hematology care.  To afford each patient quality time with our provider, please arrive at least 15 minutes before your scheduled appointment time.   Beginning January 23rd 2017 lab work for the [facility] will be done in the  Main lab at [facility] on 1st floor. If you have a lab appointment with the [facility] please come in thru the  Main Entrance and check in at the main information desk  You need to re-schedule your appointment should you arrive 10 or more minutes late.  We strive to give you quality time with our providers, and arriving late affects you and other patients whose appointments are after yours.  Also, if you no show three or more times for appointments you may be dismissed from the clinic at the providers discretion.     Again, thank you for choosing [facility].  Our hope is that these requests will decrease the amount of time that you wait before being seen by our physicians.       _____________________________________________________________  Should you have questions after your visit to [facility], please contact our office at [phone] between the hours of 8:30 a.m. and 4:30 p.m.  Voicemails left after 4:30 p.m. will not be returned until the following business day.  For prescription refill requests, have your pharmacy contact our office.         Resources For Cancer Patients and their Caregivers ? American Cancer Society: Can assist with transportation, wigs, general needs, runs Look Good Feel Better.        (805)465-0980 ? Cancer  Care: Provides financial assistance, online support groups, medication/co-pay assistance.  1-800-813-HOPE 469 557 5054) ? [facility] Assists [place] cancer patients and their families through emotional , educational and financial support.  682-877-9507 ? [place] DSS Where to apply for food stamps, Medicaid and utility assistance. 629-331-0884 ? RCATS: Transportation to medical appointments. (770)139-4424 ? Social Security Administration: May apply for disability if have a Stage IV cancer. [phone] (308)807-6606 ? [employer], Disability and Transit Services: Assists with nutrition, care and transit needs. 912-610-8072

## 2015-10-13 NOTE — Patient Instructions (Signed)
[facility] at [facility] Discharge Instructions  RECOMMENDATIONS MADE BY THE CONSULTANT AND ANY TEST RESULTS WILL BE SENT TO YOUR REFERRING PHYSICIAN.  IV Iron today.    Thank you for choosing [facility] at [facility] to provide your oncology and hematology care.  To afford each patient quality time with our provider, please arrive at least 15 minutes before your scheduled appointment time.   Beginning January 23rd 2017 lab work for the Cancer Center will be done in the  Main lab at [facility] on 1st floor. If you have a lab appointment with the Cancer Center please come in thru the  Main Entrance and check in at the main information desk  You need to re-schedule your appointment should you arrive 10 or more minutes late.  We strive to give you quality time with our providers, and arriving late affects you and other patients whose appointments are after yours.  Also, if you no show three or more times for appointments you may be dismissed from the clinic at the providers discretion.     Again, thank you for choosing [facility].  Our hope is that these requests will decrease the amount of time that you wait before being seen by our physicians.       _____________________________________________________________  Should you have questions after your visit to  Cancer Center, please contact our office at [phone] between the hours of 8:30 a.m. and 4:30 p.m.  Voicemails left after 4:30 p.m. will not be returned until the following business day.  For prescription refill requests, have your pharmacy contact our office.         Resources For Cancer Patients and their Caregivers ? American Cancer Society: Can assist with transportation, wigs, general needs, runs Look Good Feel Better.        1-888-227-6333 ? Cancer Care: Provides financial assistance, online support groups, medication/co-pay assistance.  1-800-813-HOPE  (4673) ? [facility] Assists [place] cancer patients and their families through emotional , educational and financial support.  336-427-4357 ? [place] DSS Where to apply for food stamps, Medicaid and utility assistance. 336-342-1394 ? RCATS: Transportation to medical appointments. 336-347-2287 ? Social Security Administration: May apply for disability if have a Stage IV cancer. 336-342-7796 1-800-772-1213 ? [place] [facility], Disability and Transit Services: Assists with nutrition, care and transit needs. 336-349-2343  

## 2015-10-14 LAB — ERYTHROPOIETIN: ERYTHROPOIETIN: 75.7 m[IU]/mL — AB (ref 2.6–18.5)

## 2015-10-14 LAB — HAPTOGLOBIN: Haptoglobin: 417 mg/dL — ABNORMAL HIGH (ref 34–200)

## 2015-10-19 ENCOUNTER — Other Ambulatory Visit (HOSPITAL_COMMUNITY): Payer: Medicaid Other

## 2015-10-23 ENCOUNTER — Ambulatory Visit: Payer: Medicaid Other | Admitting: Gastroenterology

## 2015-10-31 NOTE — Progress Notes (Signed)
NO SHOW

## 2015-10-31 NOTE — Assessment & Plan Note (Deleted)
Iron deficiency anemia, S/P IV feraheme on 10/22/2013 with noncompliance to subsequent follow-up appointment having last been seen on 08/06/2014; now being referred back to [facility] from primary care provider with recent lab work demonstrating iron deficiency anemia.  She has documented noncompliance with GI. Her last colonoscopy was performed by Dr. Oneida Alar in 2012 and was suboptimal due to poor prep, but no identified abnormalities appreciated.  Oncology Flowsheet 10/13/2015  Ferric Carboxymaltose IV 750 mg    Labs today as ordered: CBC, iron/TIBC, ferritin.  She did not show for a short-interval lab appointment 1 week post infusion.  Labs in 4 weeks: CBC diff, iron/TIBC, ferritin  Return in 4 weeks for follow-up.

## 2015-11-02 ENCOUNTER — Other Ambulatory Visit (HOSPITAL_COMMUNITY): Payer: Medicaid Other

## 2015-11-02 ENCOUNTER — Ambulatory Visit (HOSPITAL_COMMUNITY): Payer: Medicaid Other | Admitting: Oncology

## 2015-11-21 ENCOUNTER — Ambulatory Visit (INDEPENDENT_AMBULATORY_CARE_PROVIDER_SITE_OTHER): Payer: Medicaid Other | Admitting: Gastroenterology

## 2015-11-21 ENCOUNTER — Encounter: Payer: Self-pay | Admitting: Gastroenterology

## 2015-11-21 VITALS — BP 101/62 | HR 98 | Temp 97.9°F | Ht 64.0 in | Wt 149.2 lb

## 2015-11-21 DIAGNOSIS — D509 Iron deficiency anemia, unspecified: Secondary | ICD-10-CM | POA: Diagnosis not present

## 2015-11-21 NOTE — Patient Instructions (Signed)
1. Colonoscopy and possible upper endoscopy. Please call back to schedule when you have your calendar.  2. We will plan on having patient go into the hospital the day before for bowel prep under observation.

## 2015-11-21 NOTE — Assessment & Plan Note (Signed)
60 year old female with iron deficiency anemia, hemoglobin of 7.6, ferritin of 7 recently. Received IV iron last month. No overt GI bleeding. Hemoccults have not been completed by the patient. Encouraged staff to make sure Hemoccults are completed.  Patient has had inadequate colonoscopies in 2007 and 2010 due to poor bowel prep. Likely due to noncompliance in the setting of schizophrenia. [facility] family care owner reports that they do not have staffing that can watch her 24/ 7 during her bowel preparation and to make sure she is compliant with her diet. Plan on colonoscopy plus or minus EGD in the near future for iron deficiency anemia. We will plan on 23 hour observation day before her procedure for prep reasons. Facility owner will call back to schedule once she has looked at her calendar. We will make arrangements for hospital admission for bowel prep. I have discussed the risks, alternatives, benefits with regards to but not limited to the risk of reaction to medication, bleeding, infection, perforation and the patient is agreeable to proceed. Written consent to be obtained.

## 2015-11-21 NOTE — Progress Notes (Signed)
cc'ed to pcp °

## 2015-11-21 NOTE — Progress Notes (Signed)
Primary Care Physician:  Rosita Fire, MD  Primary Gastroenterologist:  Barney Drain, MD   Chief Complaint  Patient presents with  . OTHER    IDA    HPI:  Mary Wells is a 60 y.o. female here for further evaluation of IDA. Last seen in 2011. Noncompliant with OVs. Prior limited colonoscopies due to poor preps (2007/2010). History of IDA with no evidence of bone marrow disorder or hemoglobinopathy having received IV iron in 2015 with [facility]. Patient lost to follow-up. According to their notes, she recently had labs in February with Dr. Lenard Simmer which showed a hemoglobin of 7.6, ferritin of 7, microcytic, hypochromic, elevated RDW and reactive elevated platelet count. She received IV iron on 10/13/2015. She has not completed Hemoccult requested.  She presents with a number of [facility] where she has resided since the 4s. There have been no concerns regarding appetite, bowel function. Patient denies blood in the stool, melena, abdominal pain, heartburn, dysphagia. She has a history of schizophrenia who provides unreliable history at times.   Patient had colonoscopy in 2007, poor bowel prep with moderate liquid and formed stool in the cecum was not visualized. Advised to come back in 6 months to one year for repeat colonoscopy with a 2 day bowel prep. She presented in 2010 for screening exam was noted to have a poor bowel prep and advised to return the following day after another gallon of GoLYTELY. Patient remained have formed stool in the right colon limiting the exam. Advise come back in 5 years with a 2 day bowel prep under direct supervision. According to the staff this with her today, they believe the patient ate in the evenings after they went to bed. Owner tells me that there is no one to watch the patient during the middle the night and the patient would have access to food throughout the house.  Current Outpatient Prescriptions  Medication Sig Dispense  Refill  . benztropine (COGENTIN) 2 MG tablet Take 1 mg by mouth 2 (two) times daily.     . clonazePAM (KLONOPIN) 0.5 MG tablet Take 0.5 mg by mouth 2 (two) times daily as needed for anxiety (prn agitation).    Marland Kitchen divalproex (DEPAKOTE) 500 MG DR tablet Take 250 mg by mouth at bedtime.     . docusate sodium (COLACE) 100 MG capsule Take 100 mg by mouth daily as needed for mild constipation.    . famotidine (PEPCID) 20 MG tablet Take 20 mg by mouth daily.    . ferrous sulfate 325 (65 FE) MG tablet Take 325 mg by mouth 2 (two) times daily with a meal.    . fluPHENAZine (PROLIXIN) 1 MG tablet Take 5 mg by mouth 2 (two) times daily.     Marland Kitchen loratadine (CLARITIN) 10 MG tablet Take 10 mg by mouth daily.    . metFORMIN (GLUCOPHAGE) 500 MG tablet Take by mouth 2 (two) times daily with a meal.    . NON FORMULARY Fiber capsules 520 mg  One capsule twice daily    . simvastatin (ZOCOR) 20 MG tablet Take 20 mg by mouth daily.     No current facility-administered medications for this visit.    Allergies as of 11/21/2015  . (No Known Allergies)    Past Medical History  Diagnosis Date  . Schizophrenia ([place])   . GERD (gastroesophageal reflux disease)   . Allergy   . Diabetes mellitus without complication ([place]) 123456  . Iron deficiency anemia 09/18/2013  Past Surgical History  Procedure Laterality Date  . Colonoscopy  04/21/2006    [facility]: limited colonoscopy due to a poor bowel prep   . Colonoscopy  02/22/2009    [facility]: poor bowel prep/Formed stools in the right colon which limited the extent of the exam/The scope was passed to approximately the proximal transverse colon.  No polyps, masses, inflammatory changes, diverticular/normal rectum  . Excisional hemorrhoidectomy      Family History  Problem Relation Age of Onset  . Leukemia Paternal Grandfather   . Leukemia Cousin   . Colon cancer Neg Hx     Social History   Social History  . Marital Status: Single    Spouse Name: N/A  . Number of  Children: N/A  . Years of Education: N/A   Occupational History  . Not on file.   Social History Main Topics  . Smoking status: Former Research scientist (life sciences)  . Smokeless tobacco: Not on file     Comment: quit 10-15 years  . Alcohol Use: No  . Drug Use: No  . Sexual Activity: No   Other Topics Concern  . Not on file   Social History Narrative      ROS:  General: Negative for anorexia, weight loss, fever, chills, fatigue, weakness. Eyes: Negative for vision changes.  ENT: Negative for hoarseness, difficulty swallowing , nasal congestion. CV: Negative for chest pain, angina, palpitations, dyspnea on exertion, peripheral edema.  Respiratory: Negative for dyspnea at rest, dyspnea on exertion, cough, sputum, wheezing.  GI: See history of present illness. GU:  Negative for dysuria, hematuria, urinary incontinence, urinary frequency, nocturnal urination.  MS: Negative for joint pain, low back pain.  Derm: Negative for rash or itching.  Neuro: Negative for weakness, abnormal sensation, seizure, frequent headaches, memory loss, confusion.  Psych: Negative for anxiety, depression, suicidal ideation, hallucinations.  Endo: Negative for unusual weight change.  Heme: Negative for bruising or bleeding. Allergy: Negative for rash or hives.    Physical Examination:  BP 101/62 mmHg  Pulse 98  Temp(Src) 97.9 F (36.6 C) (Oral)  Ht 5\' 4"  (1.626 m)  Wt 149 lb 3.2 oz (67.677 kg)  BMI 25.60 kg/m2   General: Well-nourished, well-developed in no acute distress.  Head: Normocephalic, atraumatic.   Eyes: Conjunctiva pink, no icterus. Mouth: Oropharyngeal mucosa moist and pink , no lesions erythema or exudate. Neck: Supple without thyromegaly, masses, or lymphadenopathy.  Lungs: Clear to auscultation bilaterally.  Heart: Regular rate and rhythm, no murmurs rubs or gallops.  Abdomen: Bowel sounds are normal, nontender, nondistended, no hepatosplenomegaly or masses, no abdominal bruits or    hernia , no  rebound or guarding.   Rectal: not performed Extremities: No lower extremity edema. No clubbing or deformities.  Neuro: Alert and oriented x 4 , grossly normal neurologically.  Skin: Warm and dry, no rash or jaundice.   Psych: Alert and cooperative, normal mood and affect.  Labs: Lab Results  Component Value Date   TSH 0.616 10/13/2015   Lab Results  Component Value Date   CREATININE 0.78 10/13/2015   BUN 11 10/13/2015   NA 140 10/13/2015   K 4.3 10/13/2015   CL 106 10/13/2015   CO2 25 10/13/2015   Lab Results  Component Value Date   ALT 11* 10/13/2015   AST 18 10/13/2015   ALKPHOS 109 10/13/2015   BILITOT 0.2* 10/13/2015   Lab Results  Component Value Date   WBC 11.4* 10/13/2015   HGB 8.3* 10/13/2015   HCT 28.9* 10/13/2015  MCV 59.3* 10/13/2015   PLT 425* 10/13/2015   Lab Results  Component Value Date   IRON 16* 10/13/2015   TIBC 326 10/13/2015   FERRITIN 10* 10/13/2015     Imaging Studies: No results found.

## 2015-12-07 ENCOUNTER — Telehealth: Payer: Self-pay

## 2015-12-07 NOTE — Telephone Encounter (Signed)
Melissa called for patient. She said that she was seen a few weeks ago and was calling to schedule her procedure. Please call either [phone] or [phone]

## 2015-12-08 NOTE — Progress Notes (Signed)
Still waiting on group home owner to call and schedule TCS+/-EGD for IDA.  Patient will need 23 hour observation.

## 2015-12-11 NOTE — Telephone Encounter (Signed)
Called all numbers in chart and can't leave a message for any of them. Will try again.

## 2015-12-11 NOTE — Progress Notes (Signed)
Called all numbers and am unable to leave message on any of them. Will try back

## 2015-12-12 NOTE — Telephone Encounter (Signed)
Spoke with Mary Wells. Mary Wells is set for procedure on 01/23/2016 with [facility].    Contacted Bed placement and they will contact office when Mary Wells can proceed to hospital on 06/19.   Routing to Neil Crouch for further recommendations and diet for Mary Wells.

## 2015-12-12 NOTE — Progress Notes (Signed)
Pt has bed on hold for 06/19 admission. Bed placement to call when pt is to report to hospital

## 2015-12-13 ENCOUNTER — Ambulatory Visit: Payer: Medicaid Other | Admitting: Obstetrics and Gynecology

## 2015-12-21 ENCOUNTER — Ambulatory Visit (INDEPENDENT_AMBULATORY_CARE_PROVIDER_SITE_OTHER): Payer: Medicaid Other | Admitting: Obstetrics and Gynecology

## 2015-12-21 ENCOUNTER — Other Ambulatory Visit (HOSPITAL_COMMUNITY)
Admission: RE | Admit: 2015-12-21 | Discharge: 2015-12-21 | Disposition: A | Discharge status: Home / Self Care | Payer: Medicaid Other | Source: Ambulatory Visit | Attending: Obstetrics and Gynecology | Admitting: Obstetrics and Gynecology

## 2015-12-21 ENCOUNTER — Encounter: Payer: Self-pay | Admitting: Obstetrics and Gynecology

## 2015-12-21 VITALS — BP 100/62 | Ht 63.0 in | Wt 148.0 lb

## 2015-12-21 DIAGNOSIS — Z01419 Encounter for gynecological examination (general) (routine) without abnormal findings: Secondary | ICD-10-CM | POA: Insufficient documentation

## 2015-12-21 DIAGNOSIS — Z124 Encounter for screening for malignant neoplasm of cervix: Secondary | ICD-10-CM | POA: Insufficient documentation

## 2015-12-21 DIAGNOSIS — Z1151 Encounter for screening for human papillomavirus (HPV): Secondary | ICD-10-CM | POA: Insufficient documentation

## 2015-12-21 DIAGNOSIS — Z0000 Encounter for general adult medical examination without abnormal findings: Secondary | ICD-10-CM | POA: Diagnosis not present

## 2015-12-21 NOTE — Progress Notes (Signed)
Patient ID: MYLISA ZIMNY, female   DOB: 02-11-1956, 60 y.o.   MRN: [id number]   [facility] Visit  @DATE @            Patient name: Mary Wells MRN [id number]  Date of birth: [birth year]  CC & HPI:  Mary Wells is a 60 y.o. female presenting today for a pap smear. Patient was recently seen and diagnosed with DM and anemia recently, so she was encouraged to have a wellness check, per patient's caretaker (LPN). Her caretaker denies any vaginal bleeding.   ROS:  Review of Systems  Unable to perform ROS: psychiatric disorder   Pertinent History Reviewed:   Reviewed: Significant for schizophrenia, DM, anemia Medical         Past Medical History  Diagnosis Date  . Schizophrenia ([place])   . GERD (gastroesophageal reflux disease)   . Allergy   . Diabetes mellitus without complication ([place]) 123456  . Iron deficiency anemia 09/18/2013                              Surgical Hx:    Past Surgical History  Procedure Laterality Date  . Colonoscopy  04/21/2006    [facility]: limited colonoscopy due to a poor bowel prep   . Colonoscopy  02/22/2009    [facility]: poor bowel prep/Formed stools in the right colon which limited the extent of the exam/The scope was passed to approximately the proximal transverse colon.  No polyps, masses, inflammatory changes, diverticular/normal rectum  . Excisional hemorrhoidectomy     Medications: Reviewed & Updated - see associated section                       Current outpatient prescriptions:  .  benztropine (COGENTIN) 2 MG tablet, Take 1 mg by mouth 2 (two) times daily. , Disp: , Rfl:  .  clonazePAM (KLONOPIN) 0.5 MG tablet, Take 0.5 mg by mouth 2 (two) times daily as needed for anxiety (prn agitation)., Disp: , Rfl:  .  divalproex (DEPAKOTE) 500 MG DR tablet, Take 250 mg by mouth at bedtime. , Disp: , Rfl:  .  docusate sodium (COLACE) 100 MG capsule, Take 100 mg by mouth daily as needed for mild constipation., Disp: , Rfl:  .  famotidine (PEPCID) 20 MG tablet, Take 20 mg by  mouth daily., Disp: , Rfl:  .  ferrous sulfate 325 (65 FE) MG tablet, Take 325 mg by mouth 2 (two) times daily with a meal., Disp: , Rfl:  .  fluPHENAZine (PROLIXIN) 1 MG tablet, Take 5 mg by mouth 2 (two) times daily. , Disp: , Rfl:  .  loratadine (CLARITIN) 10 MG tablet, Take 10 mg by mouth daily., Disp: , Rfl:  .  metFORMIN (GLUCOPHAGE) 500 MG tablet, Take by mouth 2 (two) times daily with a meal., Disp: , Rfl:  .  NON FORMULARY, Fiber capsules 520 mg  One capsule twice daily, Disp: , Rfl:  .  simvastatin (ZOCOR) 20 MG tablet, Take 20 mg by mouth daily., Disp: , Rfl:    Social History: Reviewed -  reports that she has quit smoking. She does not have any smokeless tobacco history on file.  Objective Findings:  Vitals: Blood pressure 100/62, height 5\' 3"  (1.6 m), weight 148 lb (67.132 kg).  Physical Examination: General appearance - alert, well appearing, and in no distress and normal appearing weight  Pelvic -  VULVA: normal appearing vulva with no masses, tenderness or lesions,  VAGINA: normal appearing vagina with normal color and discharge, no lesions, [name] strings present CERVIX: normal appearing cervix without discharge or lesions,  Bimanual exam not allowed by patient.   Assessment & Plan:   A:  1. Pap smear collected.   P: Return prn.   By signing my name below, I, Stephania Fragmin, attest that this documentation has been prepared under the direction and in the presence of Jonnie Kind, MD. Electronically Signed: Stephania Fragmin, ED Scribe. 12/21/2015. 2:27 PM.  I personally performed the services described in this documentation, which was SCRIBED in my presence. The recorded information has been reviewed and considered accurate. It has been edited as necessary during review. Jonnie Kind, MD  .Tedra Coupe

## 2015-12-25 LAB — CYTOLOGY - PAP

## 2016-01-08 NOTE — Telephone Encounter (Signed)
Dr. Oneida Alar, [place] said in your last colonoscopy note 2010 (limited exam) for her to have 2-day bowel prep under direct supervision. Patient has access to food during the night at her group home. Do we need to put her in hospital two full days before procedure or early AM the day before?

## 2016-01-11 NOTE — Telephone Encounter (Signed)
So does pt need flex or TCS. Please clarify

## 2016-01-11 NOTE — Telephone Encounter (Signed)
We do nit admit & insurance dies not pay for inpt preps for colonoscopy. FLEX SIG Q8yrs is acceptable. THE GROUP HOME should do the best they can.

## 2016-01-17 NOTE — Telephone Encounter (Signed)
Called Dr. Josephine Cables office and [facility] for his nurse to call be back

## 2016-01-17 NOTE — Telephone Encounter (Signed)
Spoke with Medicaid and No PRE-CERT is needed.

## 2016-01-17 NOTE — Telephone Encounter (Signed)
I spoke to Dr. Oneida Alar in detail about this one. Reason for TCS/EGD is IDA not routine screening so flex sig not sufficient   Per Dr. Oneida Alar,  Have patient precert for 23 hour observation for bowel prep prior to colonoscopy. If it is covered by insurance, then we need to request Dr. Legrand Rams to do the admission because Dr. Oneida Alar does not have admitting privileges.  If it is not covered by insurance, group home will have to do their best to ensure adequate prep.   Let me know what you find out about precertification and we will go from there.

## 2016-01-17 NOTE — Telephone Encounter (Signed)
Ida from Dr. Josephine Cables office called and states that Dr. Legrand Rams has already left for the day and he would call and speak to LSL tomorrow.

## 2016-01-18 NOTE — Telephone Encounter (Signed)
Bed placement is confirmed for 06/19. Group home is aware of stopping Iron and aware for pt to start a clear liquid diet on Sunday 06/18

## 2016-01-18 NOTE — Telephone Encounter (Signed)
I spoke to hospitalist service. When bed control calls Monday, we then need to call [facility] AP Admits which can be found under amion.

## 2016-01-18 NOTE — Telephone Encounter (Signed)
Spoke to Dr. Legrand Rams. He is willing to have the patient on his service for 23 hour observation the day before patient's procedure to facilitate bowel prep.    1. Patient will get admitted by the hospitalist service the day before as they admit for Dr. Legrand Rams. (01/22/2016) SHE HAS TO HAVE BED EARLY AM SO WE CAN MONITOR AND GET HER PREPPED 2. Patient needs to hold iron starting now. 3. Schedule for TCS +/- EGD with Dr. Oneida Alar (I think the 20th of June is held). 4. Two full days of clear liquids before scheduled TCS, will have to begin as outpatient. Day of clear liquids metformin 1/2 tab bid. 5. We will put in prep orders after admission (suggest 4 liters of golytely on the 19th and 2 liters am of the procedure. I will discuss with Vicente Males.

## 2016-01-22 ENCOUNTER — Encounter (HOSPITAL_COMMUNITY): Payer: Self-pay | Admitting: *Deleted

## 2016-01-22 ENCOUNTER — Inpatient Hospital Stay (HOSPITAL_COMMUNITY)
Admission: RE | Admit: 2016-01-22 | Discharge: 2016-02-02 | DRG: 331 | Disposition: A | Discharge status: Nursing Home (Includes ICF) | Payer: Medicaid Other | Source: Ambulatory Visit | Attending: General Surgery | Admitting: General Surgery

## 2016-01-22 DIAGNOSIS — E785 Hyperlipidemia, unspecified: Secondary | ICD-10-CM | POA: Diagnosis not present

## 2016-01-22 DIAGNOSIS — Z7984 Long term (current) use of oral hypoglycemic drugs: Secondary | ICD-10-CM

## 2016-01-22 DIAGNOSIS — K6389 Other specified diseases of intestine: Secondary | ICD-10-CM

## 2016-01-22 DIAGNOSIS — K219 Gastro-esophageal reflux disease without esophagitis: Secondary | ICD-10-CM | POA: Diagnosis present

## 2016-01-22 DIAGNOSIS — D509 Iron deficiency anemia, unspecified: Secondary | ICD-10-CM | POA: Diagnosis not present

## 2016-01-22 DIAGNOSIS — Z9119 Patient's noncompliance with other medical treatment and regimen: Secondary | ICD-10-CM

## 2016-01-22 DIAGNOSIS — F209 Schizophrenia, unspecified: Secondary | ICD-10-CM | POA: Diagnosis present

## 2016-01-22 DIAGNOSIS — F200 Paranoid schizophrenia: Secondary | ICD-10-CM | POA: Diagnosis not present

## 2016-01-22 DIAGNOSIS — E118 Type 2 diabetes mellitus with unspecified complications: Secondary | ICD-10-CM

## 2016-01-22 DIAGNOSIS — Z95828 Presence of other vascular implants and grafts: Secondary | ICD-10-CM

## 2016-01-22 DIAGNOSIS — E119 Type 2 diabetes mellitus without complications: Secondary | ICD-10-CM | POA: Diagnosis present

## 2016-01-22 DIAGNOSIS — Z87891 Personal history of nicotine dependence: Secondary | ICD-10-CM

## 2016-01-22 DIAGNOSIS — Z806 Family history of leukemia: Secondary | ICD-10-CM

## 2016-01-22 DIAGNOSIS — C189 Malignant neoplasm of colon, unspecified: Principal | ICD-10-CM | POA: Diagnosis present

## 2016-01-22 HISTORY — DX: Malignant neoplasm of colon, unspecified: C18.9

## 2016-01-22 LAB — BASIC METABOLIC PANEL
ANION GAP: 6 (ref 5–15)
BUN: 12 mg/dL (ref 6–20)
CALCIUM: 8.3 mg/dL — AB (ref 8.9–10.3)
CO2: 25 mmol/L (ref 22–32)
Chloride: 107 mmol/L (ref 101–111)
Creatinine, Ser: 0.87 mg/dL (ref 0.44–1.00)
Glucose, Bld: 157 mg/dL — ABNORMAL HIGH (ref 65–99)
Potassium: 3.6 mmol/L (ref 3.5–5.1)
SODIUM: 138 mmol/L (ref 135–145)

## 2016-01-22 LAB — GLUCOSE, CAPILLARY
Glucose-Capillary: 92 mg/dL (ref 65–99)
Glucose-Capillary: 103 mg/dL — ABNORMAL HIGH (ref 65–99)

## 2016-01-22 LAB — CBC
HEMATOCRIT: 27.6 % — AB (ref 36.0–46.0)
Hemoglobin: 8.7 g/dL — ABNORMAL LOW (ref 12.0–15.0)
MCH: 22.3 pg — ABNORMAL LOW (ref 26.0–34.0)
MCHC: 31.5 g/dL (ref 30.0–36.0)
MCV: 70.6 fL — ABNORMAL LOW (ref 78.0–100.0)
PLATELETS: 308 10*3/uL (ref 150–400)
RBC: 3.91 MIL/uL (ref 3.87–5.11)
RDW: 18.3 % — AB (ref 11.5–15.5)
WBC: 9.2 10*3/uL (ref 4.0–10.5)

## 2016-01-22 LAB — MRSA PCR SCREENING: MRSA BY PCR: NEGATIVE

## 2016-01-22 MED ORDER — INSULIN ASPART 100 UNIT/ML ~~LOC~~ SOLN
0.0000 [IU] | Freq: Three times a day (TID) | SUBCUTANEOUS | Status: DC
  Administered 2016-01-24 – 2016-01-26 (×2): 1 [IU] via SUBCUTANEOUS

## 2016-01-22 MED ORDER — FLUPHENAZINE HCL 5 MG PO TABS
5.0000 mg | ORAL_TABLET | Freq: Two times a day (BID) | ORAL | Status: DC
  Administered 2016-01-22 – 2016-01-28 (×13): 5 mg via ORAL
  Filled 2016-01-22 (×19): qty 1

## 2016-01-22 MED ORDER — DOCUSATE SODIUM 100 MG PO CAPS: 100.0000 mg | ORAL_CAPSULE | Freq: Every day | ORAL | Status: DC | PRN

## 2016-01-22 MED ORDER — ONDANSETRON HCL 4 MG PO TABS
4.0000 mg | ORAL_TABLET | Freq: Four times a day (QID) | ORAL | Status: DC | PRN
Start: 2016-01-22 — End: 2016-01-29

## 2016-01-22 MED ORDER — PEG 3350-KCL-NA BICARB-NACL 420 G PO SOLR
4000.0000 mL | Freq: Once | ORAL | Status: AC
  Administered 2016-01-22: 4000 mL via ORAL
  Filled 2016-01-22: qty 4000

## 2016-01-22 MED ORDER — LORATADINE 10 MG PO TABS
10.0000 mg | ORAL_TABLET | Freq: Every day | ORAL | Status: DC
  Administered 2016-01-23 – 2016-01-28 (×6): 10 mg via ORAL
  Filled 2016-01-22 (×6): qty 1

## 2016-01-22 MED ORDER — ACETAMINOPHEN 325 MG PO TABS
650.0000 mg | ORAL_TABLET | Freq: Four times a day (QID) | ORAL | Status: DC | PRN
  Filled 2016-01-22: qty 2

## 2016-01-22 MED ORDER — FAMOTIDINE 20 MG PO TABS
20.0000 mg | ORAL_TABLET | Freq: Every day | ORAL | Status: DC
Start: 2016-01-22 — End: 2016-01-29
  Administered 2016-01-23 – 2016-01-28 (×6): 20 mg via ORAL
  Filled 2016-01-22 (×6): qty 1

## 2016-01-22 MED ORDER — ONDANSETRON HCL 4 MG/2ML IJ SOLN
4.0000 mg | Freq: Four times a day (QID) | INTRAMUSCULAR | Status: DC | PRN
  Administered 2016-01-23: 4 mg via INTRAVENOUS
  Filled 2016-01-22: qty 2

## 2016-01-22 MED ORDER — DIVALPROEX SODIUM 250 MG PO DR TAB
250.0000 mg | DELAYED_RELEASE_TABLET | Freq: Every day | ORAL | Status: DC
  Administered 2016-01-22 – 2016-01-28 (×7): 250 mg via ORAL
  Filled 2016-01-22 (×8): qty 1

## 2016-01-22 MED ORDER — PEG 3350-KCL-NA BICARB-NACL 420 G PO SOLR
4000.0000 mL | Freq: Once | ORAL | Status: AC
  Administered 2016-01-23: 4000 mL via ORAL

## 2016-01-22 MED ORDER — BENZTROPINE MESYLATE 1 MG PO TABS
1.0000 mg | ORAL_TABLET | Freq: Two times a day (BID) | ORAL | Status: DC
  Administered 2016-01-22 – 2016-01-28 (×13): 1 mg via ORAL
  Filled 2016-01-22 (×13): qty 1

## 2016-01-22 MED ORDER — SODIUM CHLORIDE 0.9 % IV SOLN
INTRAVENOUS | Status: DC
  Administered 2016-01-22 – 2016-01-29 (×7): via INTRAVENOUS

## 2016-01-22 MED ORDER — CLONAZEPAM 0.5 MG PO TABS: 0.5000 mg | ORAL_TABLET | Freq: Two times a day (BID) | ORAL | Status: DC | PRN

## 2016-01-22 MED ORDER — INSULIN ASPART 100 UNIT/ML ~~LOC~~ SOLN: 0.0000 [IU] | Freq: Every day | SUBCUTANEOUS | Status: DC

## 2016-01-22 MED ORDER — ACETAMINOPHEN 650 MG RE SUPP: 650.0000 mg | Freq: Four times a day (QID) | RECTAL | Status: DC | PRN

## 2016-01-22 MED ORDER — SIMVASTATIN 20 MG PO TABS
20.0000 mg | ORAL_TABLET | Freq: Every day | ORAL | Status: DC
Start: 2016-01-22 — End: 2016-01-29
  Administered 2016-01-22 – 2016-01-28 (×7): 20 mg via ORAL
  Filled 2016-01-22 (×7): qty 1

## 2016-01-22 NOTE — Consult Note (Signed)
Referring Provider: No ref. provider found Primary Care Physician:  Rosita Fire, MD Primary Gastroenterologist:  Dr. Oneida Alar  Date of Admission: 01/22/16 (Observation) Date of Consultation: 01/22/16  Reason for Consultation:  Monitored bowel prep for colonoscopy, IDA  HPI:  Mary Wells is a 60 y.o. female with a past medical history of Schizophrenia, GERD, DM, and IDA. She was last seen in our office for 18 2017 for further evaluation of iron deficiency anemia, last seen in 2011. She has been noncompliant with her office visits. Prior colonoscopies were limited due to poor prep in 2007 and again in 2010. Has been followed by hematology, no evidence of bone marrow disorder or hemoglobinopathy, has received IV iron in the past. Patient lost to follow-up. Last hemoglobin 7.6 with a ferritin of 7, microcytic, hyperchromic, elevated RDW and reactive elevated platelet count. Hemoccult not completed as requested. She currently resides at [facility] where she has lived since the 19s.  Last colonoscopy 2007 with poor bowel prep and moderate liquid and formed stool in the cecum. Advised come back in 6 months to one year for repeat colonoscopy in 2 day bowel prep. Presented in 2010 for screening exam again noted to have poor bowel prep and advised to return the following day after another gallon of GoLYTELY. Patient continued to have formed stool in the right colon limiting the exam, advised 5 year repeat with a 2 day bowel prep, the direct supervision. Group home staff feels that the patient ate in the evenings after they've gone the bed and the home notes that there is nobody there available in the middle the night and would likely need inpatient admission supervised prep.  Today she states she's doing well. She denies abdominal pain, nausea/vomiting, chest pain, shortness of breath, dizziness, lightheadedness, syncope. She is asking about eating. I explained her that she can have clear liquids. She  is generally asymptomatic from a GI standpoint. She is unsure she is having rectal bleeding or not. She has been admitted to 23 hour observation under Dr. Legrand Rams for direct supervision of extended bowel prep. She was have clear liquids yesterday, repeat clear liquids today and standard bowel prep with 4 L of GoLYTELY the night before the procedure and an additional 2 L the morning of the procedure.  Past Medical History  Diagnosis Date  . Schizophrenia ([place])   . GERD (gastroesophageal reflux disease)   . Allergy   . Diabetes mellitus without complication ([place]) 123456  . Iron deficiency anemia 09/18/2013    Past Surgical History  Procedure Laterality Date  . Colonoscopy  04/21/2006    SLF: limited colonoscopy due to a poor bowel prep   . Colonoscopy  02/22/2009    SLF: poor bowel prep/Formed stools in the right colon which limited the extent of the exam/The scope was passed to approximately the proximal transverse colon.  No polyps, masses, inflammatory changes, diverticular/normal rectum  . Excisional hemorrhoidectomy      Prior to Admission medications   Medication Sig Start Date End Date Taking? Authorizing Provider  benztropine (COGENTIN) 2 MG tablet Take 1 mg by mouth 2 (two) times daily.    Yes Historical Provider, MD  clonazePAM (KLONOPIN) 0.5 MG tablet Take 0.5 mg by mouth 2 (two) times daily as needed for anxiety (prn agitation).   Yes Historical Provider, MD  divalproex (DEPAKOTE ER) 250 MG 24 hr tablet Take 250 mg by mouth at bedtime.   Yes Historical Provider, MD  docusate sodium (COLACE) 100 MG  capsule Take 100 mg by mouth daily as needed for mild constipation.   Yes Historical Provider, MD  famotidine (PEPCID) 20 MG tablet Take 20 mg by mouth daily.   Yes Historical Provider, MD  ferrous sulfate 325 (65 FE) MG tablet Take 325 mg by mouth 2 (two) times daily with a meal.   Yes Historical Provider, MD  fluPHENAZine (PROLIXIN) 5 MG tablet Take 5 mg by mouth 2 (two) times daily.    Yes Historical Provider, MD  loratadine (CLARITIN) 10 MG tablet Take 10 mg by mouth daily.   Yes Historical Provider, MD  metFORMIN (GLUCOPHAGE) 500 MG tablet Take 500 mg by mouth 2 (two) times daily with a meal.    Yes Historical Provider, MD  NON FORMULARY Take 1 capsule by mouth 2 (two) times daily. Fiber capsules 520 mg.   Yes Historical Provider, MD  simvastatin (ZOCOR) 20 MG tablet Take 20 mg by mouth daily.   Yes Historical Provider, MD    Current Facility-Administered Medications  Medication Dose Route Frequency Provider Last Rate Last Dose  . 0.9 %  sodium chloride infusion   Intravenous Continuous Kathie Dike, MD      . acetaminophen (TYLENOL) tablet 650 mg  650 mg Oral Q6H PRN Kathie Dike, MD       Or  . acetaminophen (TYLENOL) suppository 650 mg  650 mg Rectal Q6H PRN Kathie Dike, MD      . benztropine (COGENTIN) tablet 1 mg  1 mg Oral BID Kathie Dike, MD      . clonazePAM (KLONOPIN) tablet 0.5 mg  0.5 mg Oral BID PRN Kathie Dike, MD      . divalproex (DEPAKOTE) DR tablet 250 mg  250 mg Oral QHS Kathie Dike, MD      . docusate sodium (COLACE) capsule 100 mg  100 mg Oral Daily PRN Kathie Dike, MD      . famotidine (PEPCID) tablet 20 mg  20 mg Oral Daily Kathie Dike, MD      . fluPHENAZine (PROLIXIN) tablet 5 mg  5 mg Oral BID Kathie Dike, MD      . insulin aspart (novoLOG) injection 0-5 Units  0-5 Units Subcutaneous QHS Kathie Dike, MD      . insulin aspart (novoLOG) injection 0-9 Units  0-9 Units Subcutaneous TID WC Kathie Dike, MD      . loratadine (CLARITIN) tablet 10 mg  10 mg Oral Daily Kathie Dike, MD      . ondansetron (ZOFRAN) tablet 4 mg  4 mg Oral Q6H PRN Kathie Dike, MD       Or  . ondansetron (ZOFRAN) injection 4 mg  4 mg Intravenous Q6H PRN Kathie Dike, MD      . polyethylene glycol-electrolytes (NuLYTELY/GoLYTELY) solution 4,000 mL  4,000 mL Oral Once Carlis Stable, NP      . Derrill Memo ON 01/23/2016] polyethylene  glycol-electrolytes (NuLYTELY/GoLYTELY) solution 4,000 mL  4,000 mL Oral Once Carlis Stable, NP      . simvastatin (ZOCOR) tablet 20 mg  20 mg Oral q1800 Kathie Dike, MD        Allergies as of 12/12/2015  . (No Known Allergies)    Family History  Problem Relation Age of Onset  . Leukemia Paternal Grandfather   . Leukemia Cousin   . Colon cancer Neg Hx     Social History   Social History  . Marital Status: Single    Spouse Name: N/A  . Number of Children: N/A  .  Years of Education: N/A   Occupational History  . Not on file.   Social History Main Topics  . Smoking status: Former Research scientist (life sciences)  . Smokeless tobacco: Not on file     Comment: quit 10-15 years  . Alcohol Use: No  . Drug Use: No  . Sexual Activity: No   Other Topics Concern  . Not on file   Social History Narrative    Review of Systems: Noted poor historian due to Schizophrenia. Gen: Denies fever, chills, loss of appetite, change in weight or weight loss CV: Denies chest pain, heart palpitations, syncope, edema  Resp: Denies shortness of breath with rest, cough, wheezing GI: See HPI.  Heme: Denies bruising, bleeding.  Physical Exam: Vital signs in last 24 hours: Temp:  [97.8 F (36.6 C)-98.1 F (36.7 C)] 97.8 F (36.6 C) (06/19 1300) Pulse Rate:  [80-94] 80 (06/19 1300) Resp:  [18] 18 (06/19 1300) BP: (90-95)/(55-57) 95/57 mmHg (06/19 1300) SpO2:  [97 %] 97 % (06/19 1300) Weight:  [144 lb 1.6 oz (65.363 kg)] 144 lb 1.6 oz (65.363 kg) (06/19 1002) Last BM Date: 01/21/16 General:   Alert,  Well-developed, well-nourished, pleasant and cooperative in NAD Head:  Normocephalic and atraumatic. Eyes:  Sclera clear, no icterus. Conjunctiva pink. Ears:  Normal auditory acuity. Lungs:  Clear throughout to auscultation.   No wheezes, crackles, or rhonchi. No acute distress. Heart:  Regular rate and rhythm; no murmurs, clicks, rubs,  or gallops. Abdomen:  Soft, nontender and nondistended. No masses,  hepatosplenomegaly or hernias noted. Normal bowel sounds, without guarding, and without rebound.   Rectal:  Deferred.   Msk:  Symmetrical without gross deformities. Pulses:  Normal pulses noted. Extremities:  Without clubbing or edema. Neurologic:  Alert and  oriented;  grossly normal neurologically, slow to respond, unsure if she is retaining information provided to her. Skin:  Intact without significant lesions or rashes. Psych:  Alert and cooperative, pleasent.  Intake/Output from previous day:   Intake/Output this shift: Total I/O In: 360 [P.O.:360] Out: -   Lab Results: No results for input(s): WBC, HGB, HCT, PLT in the last 72 hours. BMET No results for input(s): NA, K, CL, CO2, GLUCOSE, BUN, CREATININE, CALCIUM in the last 72 hours. LFT No results for input(s): PROT, ALBUMIN, AST, ALT, ALKPHOS, BILITOT, BILIDIR, IBILI in the last 72 hours. PT/INR No results for input(s): LABPROT, INR in the last 72 hours. Hepatitis Panel No results for input(s): HEPBSAG, HCVAB, HEPAIGM, HEPBIGM in the last 72 hours. C-Diff No results for input(s): CDIFFTOX in the last 72 hours.  Studies/Results: No results found.  Impression: 60 year old female with a history of IDA unable to be fully evaluated due to multiple failed colnooscopy attempts due to poor prep. She lives in a group home that does not have 24-hour supervision and likely eating solid foods after hours. At this point the decision was made to admit the patient to 23 hour observation through Dr. Legrand Rams in order to provide supervised bowel prep. Generally she is asymptomatic from a GI standpoint, although I am unsure of how much she is able to understand or convey due to her history of schizophrenia.  Her last hemoglobin noted 10/13/2015 at 8.3 with an MCV 59.3 and MCH 17.0, platelet count 425. Her last ferritin also on 10/13/2015 was low at 10. Nursing staff states they have obtained consent from the patient. I have requested POA to  also provide phone consent as there's a question of patients legal ability to  provide informed consent. They state they will contact the home to do this.  Plan: 1. Continued clear liquids 2. NPO after midnight tonight 3. 4L GoLytely tonight 4. Additional 2 L Golytely tomorrow morning 5. Tap water enemas x 2 on call to procedure 6. Contact group home/POA for phone consent due to questionable ability of patient to give informed consent 7. Plan colonoscopy tomorrow    Walden Field, AGNP-C Adult & Gerontological Nurse Practitioner [facility]        01/22/2016, 2:03 PM

## 2016-01-22 NOTE — H&P (Signed)
History and Physical    Mary Wells [id number] DOB: [birth year] DOA: 01/22/2016  Referring MD/NP/PA: Neil Crouch PA-C PCP: Rosita Fire, MD  Outpatient Specialists: GI, Dr. Oneida Alar Patient coming from: group home  Chief Complaint: Colonoscopy prep  HPI: Mary Wells is a 60 y.o. female with medical history significant of DM, Schizophrenia, iron-deficiency anemia presented as a direct admit for bowel prep and colonoscopy planned for 6/20. Per charts patient has a history of noncompliance in the setting of schizophrenia. [facility] reports that they do not have staffing that can watch her 24/ 7 during her bowel preparation and to make sure she is compliant with her diet. She has had two colonoscopies in the past that were incomplete studies due to inadequate bowel prep. Per patient she has no complaints of nausea, vomiting, SOB, pains, fever, dysuria, sores on skin, constipation, tobacco or EtOH use. She reports having frequent bowel movements with noticeable blood in stool.   Review of Systems: As per HPI otherwise 10 point review of systems negative.   Past Medical History  Diagnosis Date  . Schizophrenia ([place])   . GERD (gastroesophageal reflux disease)   . Allergy   . Diabetes mellitus without complication ([place]) 123456  . Iron deficiency anemia 09/18/2013    Past Surgical History  Procedure Laterality Date  . Colonoscopy  04/21/2006    SLF: limited colonoscopy due to a poor bowel prep   . Colonoscopy  02/22/2009    SLF: poor bowel prep/Formed stools in the right colon which limited the extent of the exam/The scope was passed to approximately the proximal transverse colon.  No polyps, masses, inflammatory changes, diverticular/normal rectum  . Excisional hemorrhoidectomy       reports that she has quit smoking. She does not have any smokeless tobacco history on file. She reports that she does not drink alcohol or use illicit drugs.  No Known Allergies  Family History    Problem Relation Age of Onset  . Leukemia Paternal Grandfather   . Leukemia Cousin   . Colon cancer Neg Hx      Prior to Admission medications   Medication Sig Start Date End Date Taking? Authorizing Provider  benztropine (COGENTIN) 2 MG tablet Take 1 mg by mouth 2 (two) times daily.     Historical Provider, MD  clonazePAM (KLONOPIN) 0.5 MG tablet Take 0.5 mg by mouth 2 (two) times daily as needed for anxiety (prn agitation).    Historical Provider, MD  divalproex (DEPAKOTE) 500 MG DR tablet Take 250 mg by mouth at bedtime.     Historical Provider, MD  docusate sodium (COLACE) 100 MG capsule Take 100 mg by mouth daily as needed for mild constipation.    Historical Provider, MD  famotidine (PEPCID) 20 MG tablet Take 20 mg by mouth daily.    Historical Provider, MD  ferrous sulfate 325 (65 FE) MG tablet Take 325 mg by mouth 2 (two) times daily with a meal.    Historical Provider, MD  fluPHENAZine (PROLIXIN) 1 MG tablet Take 5 mg by mouth 2 (two) times daily.     Historical Provider, MD  loratadine (CLARITIN) 10 MG tablet Take 10 mg by mouth daily.    Historical Provider, MD  metFORMIN (GLUCOPHAGE) 500 MG tablet Take by mouth 2 (two) times daily with a meal.    Historical Provider, MD  NON FORMULARY Fiber capsules 520 mg  One capsule twice daily    Historical Provider, MD  simvastatin (ZOCOR) 20 MG tablet  Take 20 mg by mouth daily.    Historical Provider, MD    Physical Exam: Filed Vitals:   01/22/16 1002  BP: 90/55  Pulse: 94  Temp: 98.1 F (36.7 C)  TempSrc: Oral  Resp: 18  Height: 5\' 4"  (1.626 m)  Weight: 65.363 kg (144 lb 1.6 oz)  SpO2: 97%      Constitutional: NAD, calm, comfortable Filed Vitals:   01/22/16 1002  BP: 90/55  Pulse: 94  Temp: 98.1 F (36.7 C)  TempSrc: Oral  Resp: 18  Height: 5\' 4"  (1.626 m)  Weight: 65.363 kg (144 lb 1.6 oz)  SpO2: 97%   Eyes: PERRL, lids and conjunctivae normal ENMT: Mucous membranes are moist. Posterior pharynx clear of any  exudate or lesions.Normal dentition.  Respiratory: clear to auscultation bilaterally, no wheezing, no crackles. Normal respiratory effort. No accessory muscle use.  Cardiovascular: Regular rate and rhythm, no murmurs / rubs / gallops. No extremity edema. 2+ pedal pulses. No carotid bruits.  Abdomen: no tenderness, no masses palpated. No hepatosplenomegaly. Bowel sounds positive.  Musculoskeletal: no clubbing / cyanosis. No joint deformity upper and lower extremities. Good ROM, no contractures. Normal muscle tone.  Skin: no rashes, lesions, ulcers. No induration Neurologic: CN 2-12 grossly intact. Sensation intact, DTR normal. Strength 5/5 in all 4.  Psychiatric:  Alert and oriented x 3. Normal mood.    Labs on Admission: I have personally reviewed following labs and imaging studies  CBC: No results for input(s): WBC, NEUTROABS, HGB, HCT, MCV, PLT in the last 168 hours. Basic Metabolic Panel: No results for input(s): NA, K, CL, CO2, GLUCOSE, BUN, CREATININE, CALCIUM, MG, PHOS in the last 168 hours. GFR: CrCl cannot be calculated (Patient has no serum creatinine result on file.). Liver Function Tests: No results for input(s): AST, ALT, ALKPHOS, BILITOT, PROT, ALBUMIN in the last 168 hours. No results for input(s): LIPASE, AMYLASE in the last 168 hours. No results for input(s): AMMONIA in the last 168 hours. Coagulation Profile: No results for input(s): INR, PROTIME in the last 168 hours. Cardiac Enzymes: No results for input(s): CKTOTAL, CKMB, CKMBINDEX, TROPONINI in the last 168 hours. BNP (last 3 results) No results for input(s): PROBNP in the last 8760 hours. HbA1C: No results for input(s): HGBA1C in the last 72 hours. CBG: No results for input(s): GLUCAP in the last 168 hours. Lipid Profile: No results for input(s): CHOL, HDL, LDLCALC, TRIG, CHOLHDL, LDLDIRECT in the last 72 hours. Thyroid Function Tests: No results for input(s): TSH, T4TOTAL, FREET4, T3FREE, THYROIDAB in the  last 72 hours. Anemia Panel: No results for input(s): VITAMINB12, FOLATE, FERRITIN, TIBC, IRON, RETICCTPCT in the last 72 hours. Urine analysis: No results found for: COLORURINE, APPEARANCEUR, LABSPEC, PHURINE, GLUCOSEU, HGBUR, BILIRUBINUR, KETONESUR, PROTEINUR, UROBILINOGEN, NITRITE, LEUKOCYTESUR Sepsis Labs: @LABRCNTIP (procalcitonin:4,lacticidven:4) )No results found for this or any previous visit (from the past 240 hour(s)).   Radiological Exams on Admission: No results found.  EKG: Independently reviewed.   Assessment/Plan Active Problems:   Diabetes mellitus (HCC)   Schizophrenia (HCC)   Iron deficiency anemia   HLD (hyperlipidemia)   GERD (gastroesophageal reflux disease) 1. Iron-deficiency anemia, Last Hgb noted to be 8.3. Will repeat today. Plans are for colonoscopy tomorrow after bowel prep. 2. DM Type 2. Start SSI. Hold metformin. 3. HLD. Continue statin.  4. GERD. Continue Pepcid  5. Schizophrenia. Continue psychotropic medications.   DVT prophylaxis: SCDs Code Status: Full Family Communication: No family bedside  Disposition Plan: Plan for discharge back to SNF in 24-48 hours.  Consults called:  Admission status: Observation   Kathie Dike, MD  [facility] Pager [phone]  If 7PM-7AM, please contact night-coverage [email or web address] Password [email or web address]  01/22/2016, 12:19 PM     By signing my name below, I, Rennis Harding, attest that this documentation has been prepared under the direction and in the presence of Kathie Dike, MD. Electronically signed: Rennis Harding, Scribe. 01/22/2016 12:07pm   I, Dr. Kathie Dike, personally performed the services described in this documentaiton. All medical record entries made by the scribe were at my direction and in my presence. I have reviewed the chart and agree that the record reflects my personal performance and is accurate and complete  Kathie Dike, MD, 01/22/2016 12:19 PM

## 2016-01-23 ENCOUNTER — Encounter (HOSPITAL_COMMUNITY): Payer: Self-pay | Admitting: Anesthesiology

## 2016-01-23 ENCOUNTER — Encounter (HOSPITAL_COMMUNITY)
Admission: RE | Disposition: A | Discharge status: Nursing Home (Includes ICF) | Payer: Self-pay | Source: Ambulatory Visit | Attending: Internal Medicine

## 2016-01-23 DIAGNOSIS — D509 Iron deficiency anemia, unspecified: Secondary | ICD-10-CM | POA: Diagnosis not present

## 2016-01-23 DIAGNOSIS — F200 Paranoid schizophrenia: Secondary | ICD-10-CM | POA: Diagnosis not present

## 2016-01-23 DIAGNOSIS — K219 Gastro-esophageal reflux disease without esophagitis: Secondary | ICD-10-CM | POA: Diagnosis not present

## 2016-01-23 LAB — GLUCOSE, CAPILLARY
GLUCOSE-CAPILLARY: 98 mg/dL (ref 65–99)
GLUCOSE-CAPILLARY: 87 mg/dL (ref 65–99)
Glucose-Capillary: 100 mg/dL — ABNORMAL HIGH (ref 65–99)
Glucose-Capillary: 82 mg/dL (ref 65–99)

## 2016-01-23 LAB — BASIC METABOLIC PANEL
ANION GAP: 6 (ref 5–15)
BUN: 8 mg/dL (ref 6–20)
CALCIUM: 8.3 mg/dL — AB (ref 8.9–10.3)
CHLORIDE: 111 mmol/L (ref 101–111)
CO2: 27 mmol/L (ref 22–32)
CREATININE: 0.76 mg/dL (ref 0.44–1.00)
GFR calc non Af Amer: >60 mL/min (ref >60)
GLUCOSE: 97 mg/dL (ref 65–99)
Potassium: 4 mmol/L (ref 3.5–5.1)
Sodium: 144 mmol/L (ref 135–145)

## 2016-01-23 LAB — CBC
HEMATOCRIT: 27.7 % — AB (ref 36.0–46.0)
HEMOGLOBIN: 8.8 g/dL — AB (ref 12.0–15.0)
MCH: 22.5 pg — ABNORMAL LOW (ref 26.0–34.0)
MCHC: 31.8 g/dL (ref 30.0–36.0)
MCV: 70.8 fL — AB (ref 78.0–100.0)
Platelets: 349 10*3/uL (ref 150–400)
RBC: 3.91 MIL/uL (ref 3.87–5.11)
RDW: 18.3 % — AB (ref 11.5–15.5)
WBC: 7.5 10*3/uL (ref 4.0–10.5)

## 2016-01-23 SURGERY — COLONOSCOPY WITH PROPOFOL
Anesthesia: Monitor Anesthesia Care

## 2016-01-23 MED ORDER — LIDOCAINE HCL (PF) 1 % IJ SOLN
INTRAMUSCULAR | Status: AC
  Filled 2016-01-23: qty 5

## 2016-01-23 MED ORDER — POLYETHYLENE GLYCOL 3350 17 G PO PACK
17.0000 g | PACK | ORAL | Status: AC
  Administered 2016-01-23 (×2): 17 g via ORAL
  Filled 2016-01-23 (×2): qty 1

## 2016-01-23 MED ORDER — LINACLOTIDE 145 MCG PO CAPS
290.0000 ug | ORAL_CAPSULE | Freq: Every day | ORAL | Status: DC
  Administered 2016-01-24 – 2016-01-28 (×5): 290 ug via ORAL
  Filled 2016-01-23 (×5): qty 2

## 2016-01-23 MED ORDER — PEG 3350-KCL-NA BICARB-NACL 420 G PO SOLR
ORAL | Status: AC
  Filled 2016-01-23: qty 4000

## 2016-01-23 MED ORDER — BISACODYL 5 MG PO TBEC
10.0000 mg | DELAYED_RELEASE_TABLET | ORAL | Status: AC
  Administered 2016-01-23 (×2): 10 mg via ORAL
  Filled 2016-01-23 (×2): qty 2

## 2016-01-23 MED ORDER — PEG 3350-KCL-NA BICARB-NACL 420 G PO SOLR
2000.0000 mL | Freq: Once | ORAL | Status: AC
  Administered 2016-01-24: 2000 mL via ORAL

## 2016-01-23 MED ORDER — PROPOFOL 10 MG/ML IV BOLUS
INTRAVENOUS | Status: AC
  Filled 2016-01-23: qty 40

## 2016-01-23 MED ORDER — PEG 3350-KCL-NA BICARB-NACL 420 G PO SOLR
4000.0000 mL | Freq: Once | ORAL | Status: AC
  Administered 2016-01-23: 4000 mL via ORAL
  Filled 2016-01-23: qty 4000

## 2016-01-23 NOTE — Clinical Social Work Note (Signed)
Clinical Social Work Assessment  Patient Details  Name: Mary Wells MRN: [id number] Date of Birth: 1955-09-11  Date of referral:  01/23/16               Reason for consult:  Discharge Planning                Permission sought to share information with:  Chartered certified accountant granted to share information::  Yes, Verbal Permission Granted  Name::        Agency::  [place]  Relationship::  facility  Contact Information:     Housing/Transportation Living arrangements for the past 2 months:   ([place]) Source of Information:  Patient, Facility Patient Interpreter Needed:  None Criminal Activity/Legal Involvement Pertinent to Current Situation/Hospitalization:  No - Comment as needed Significant Relationships:  Other(Comment) (Aunt limited involvement) Lives with:  Facility Resident Do you feel safe going back to the place where you live?  Yes Need for family participation in patient care:  No (Coment)  Care giving concerns:  None reported. Pt is long term resident at [facility].    Social Worker assessment / plan:  CSW met with pt at bedside. Pt has been a resident at [facility] for about 27 years. Pt observation for bowel prep for colonoscopy scheduled today. However, due to poor prep, this has been rescheduled for tomorrow. Pt indicates that her aunt Mary Wells is updated by facility on phone occasionally, but she does not have family who visits. Pt likes it at [facility] and is ready to return when able. Per Karren Burly, administrator at facility, pt makes own decisions and does not have guardian/HCPOA. At baseline, pt is fairly independent and only requires assist with bathing. Okay to return.   Employment status:    Insurance information:  Medicaid In [place] PT Recommendations:  Not assessed at this time Information / Referral to community resources:  Other (Comment Required) (Return to [facility])  Patient/Family's Response to care:  Pt requests to return to [facility] as  soon as possible.   Patient/Family's Understanding of and Emotional Response to Diagnosis, Current Treatment, and Prognosis:  Pt was not aware of reason for hospital stay. CSW discussed colonoscopy scheduled for tomorrow.   Emotional Assessment Appearance:  Appears older than stated age Attitude/Demeanor/Rapport:  Other (Cooperative) Affect (typically observed):  Appropriate Orientation:  Oriented to Self, Oriented to Place, Oriented to  Time Alcohol / Substance use:  Not Applicable Psych involvement (Current and /or in the community):  No (Comment)  Discharge Needs  Concerns to be addressed:  Discharge Planning Concerns Readmission within the last 30 days:  No Current discharge risk:  None Barriers to Discharge:  No Barriers Identified   Salome Arnt, [place] 01/23/2016, 10:36 AM [phone]

## 2016-01-23 NOTE — Telephone Encounter (Signed)
Done

## 2016-01-23 NOTE — Progress Notes (Signed)
Telephone consent obtained from Rodney Cruise (Administrator) [facility] for patient to have colonoscopy with propofol, Esophagogastroduodenoscopy EGD with propofol.

## 2016-01-23 NOTE — Progress Notes (Signed)
Patient has not had adequate results from prep and two tap water enemas.  Results were extremely dark brown after second enema.  Mary Wells notified, as well as Dr Oneida Alar.  Order received to reschedule for tomorrow.

## 2016-01-23 NOTE — Progress Notes (Signed)
Subjective: 60 years old female with history of multiple medical illnesses was admitted under observation for preparation for colonoscopy. She is a known case of schizophrenia and lives in group home. Several attempt was done for out patient preparation but she was not complying to the instructions.  Objective: Vital signs in last 24 hours: Temp:  [97.8 F (36.6 C)-98.4 F (36.9 C)] 98.3 F (36.8 C) (06/20 0523) Pulse Rate:  [69-94] 69 (06/20 0523) Resp:  [18-19] 19 (06/20 0523) BP: (90-102)/(50-74) 98/50 mmHg (06/20 0523) SpO2:  [97 %-100 %] 100 % (06/20 0523) Weight:  [65.363 kg (144 lb 1.6 oz)] 65.363 kg (144 lb 1.6 oz) (06/19 1002) Weight change:  Last BM Date: 01/22/16  Intake/Output from previous day: 06/19 0701 - 06/20 0700 In: 542.5 [P.O.:360; I.V.:182.5] Out: -   PHYSICAL EXAM General appearance: alert and no distress Resp: clear to auscultation bilaterally Cardio: S1, S2 normal GI: soft, non-tender; bowel sounds normal; no masses,  no organomegaly Extremities: extremities normal, atraumatic, no cyanosis or edema  Lab Results:  Results for orders placed or performed during the hospital encounter of 01/22/16 (from the past 48 hour(s))  MRSA PCR Screening     Status: None   Collection Time: 01/22/16 12:42 PM  Result Value Ref Range   MRSA by PCR NEGATIVE NEGATIVE    Comment:        The GeneXpert MRSA Assay (FDA approved for NASAL specimens only), is one component of a comprehensive MRSA colonization surveillance program. It is not intended to diagnose MRSA infection nor to guide or monitor treatment for MRSA infections.   CBC     Status: Abnormal   Collection Time: 01/22/16  2:10 PM  Result Value Ref Range   WBC 9.2 4.0 - 10.5 K/uL   RBC 3.91 3.87 - 5.11 MIL/uL   Hemoglobin 8.7 (L) 12.0 - 15.0 g/dL   HCT 27.6 (L) 36.0 - 46.0 %   MCV 70.6 (L) 78.0 - 100.0 fL   MCH 22.3 (L) 26.0 - 34.0 pg   MCHC 31.5 30.0 - 36.0 g/dL   RDW 18.3 (H) 11.5 - 15.5 %   Platelets 308 150 - 400 K/uL  Basic metabolic panel     Status: Abnormal   Collection Time: 01/22/16  2:10 PM  Result Value Ref Range   Sodium 138 135 - 145 mmol/L   Potassium 3.6 3.5 - 5.1 mmol/L   Chloride 107 101 - 111 mmol/L   CO2 25 22 - 32 mmol/L   Glucose, Bld 157 (H) 65 - 99 mg/dL   BUN 12 6 - 20 mg/dL   Creatinine, Ser 0.87 0.44 - 1.00 mg/dL   Calcium 8.3 (L) 8.9 - 10.3 mg/dL   GFR calc non Af Amer >60 >60 mL/min   GFR calc Af Amer >60 >60 mL/min    Comment: (NOTE) The eGFR has been calculated using the CKD EPI equation. This calculation has not been validated in all clinical situations. eGFR's persistently <60 mL/min signify possible Chronic Kidney Disease.    Anion gap 6 5 - 15  Glucose, capillary     Status: None   Collection Time: 01/22/16  4:42 PM  Result Value Ref Range   Glucose-Capillary 92 65 - 99 mg/dL   Comment 1 Document in Chart   Glucose, capillary     Status: Abnormal   Collection Time: 01/22/16 10:39 PM  Result Value Ref Range   Glucose-Capillary 103 (H) 65 - 99 mg/dL   Comment 1 Notify RN  Comment 2 Document in Chart   CBC     Status: Abnormal   Collection Time: 01/23/16  4:28 AM  Result Value Ref Range   WBC 7.5 4.0 - 10.5 K/uL   RBC 3.91 3.87 - 5.11 MIL/uL   Hemoglobin 8.8 (L) 12.0 - 15.0 g/dL   HCT 27.7 (L) 36.0 - 46.0 %   MCV 70.8 (L) 78.0 - 100.0 fL   MCH 22.5 (L) 26.0 - 34.0 pg   MCHC 31.8 30.0 - 36.0 g/dL   RDW 18.3 (H) 11.5 - 15.5 %   Platelets 349 150 - 400 K/uL  Basic metabolic panel     Status: Abnormal   Collection Time: 01/23/16  4:28 AM  Result Value Ref Range   Sodium 144 135 - 145 mmol/L   Potassium 4.0 3.5 - 5.1 mmol/L   Chloride 111 101 - 111 mmol/L   CO2 27 22 - 32 mmol/L   Glucose, Bld 97 65 - 99 mg/dL   BUN 8 6 - 20 mg/dL   Creatinine, Ser 0.76 0.44 - 1.00 mg/dL   Calcium 8.3 (L) 8.9 - 10.3 mg/dL   GFR calc non Af Amer >60 >60 mL/min   GFR calc Af Amer >60 >60 mL/min    Comment: (NOTE) The eGFR has been  calculated using the CKD EPI equation. This calculation has not been validated in all clinical situations. eGFR's persistently <60 mL/min signify possible Chronic Kidney Disease.    Anion gap 6 5 - 15  Glucose, capillary     Status: Abnormal   Collection Time: 01/23/16  7:44 AM  Result Value Ref Range   Glucose-Capillary 100 (H) 65 - 99 mg/dL    ABGS No results for input(s): PHART, PO2ART, TCO2, HCO3 in the last 72 hours.  Invalid input(s): PCO2 CULTURES Recent Results (from the past 240 hour(s))  MRSA PCR Screening     Status: None   Collection Time: 01/22/16 12:42 PM  Result Value Ref Range Status   MRSA by PCR NEGATIVE NEGATIVE Final    Comment:        The GeneXpert MRSA Assay (FDA approved for NASAL specimens only), is one component of a comprehensive MRSA colonization surveillance program. It is not intended to diagnose MRSA infection nor to guide or monitor treatment for MRSA infections.    Studies/Results: No results found.  Medications: I have reviewed the patient's current medications.  Assesment:   Active Problems:   Diabetes mellitus (HCC)   Schizophrenia (HCC)   Iron deficiency anemia   HLD (hyperlipidemia)   GERD (gastroesophageal reflux disease)    Plan:  Medications reviewed Screening colonoscopy as planned. Will discharge after the procedure .      Verland Sprinkle 01/23/2016, 8:01 AM

## 2016-01-23 NOTE — Progress Notes (Signed)
    Subjective: Denies abdominal pain, N/V. Is wanting something to drink. No other GI complaints at this time.  Objective: Vital signs in last 24 hours: Temp:  [98.3 F (36.8 C)-98.4 F (36.9 C)] 98.3 F (36.8 C) (06/20 0523) Pulse Rate:  [69-78] 69 (06/20 0523) Resp:  [18-19] 19 (06/20 0523) BP: (98-102)/(50-74) 98/50 mmHg (06/20 0523) SpO2:  [100 %] 100 % (06/20 0523) Last BM Date: 01/22/16 General:   Alert and pleasant, resting comfortably in bed. Eyes:  No icterus, sclera clear. Conjuctiva pink.  Heart:  S1, S2 present, no murmurs noted.  Lungs: Clear to auscultation bilaterally, without wheezing, rales, or rhonchi.  Abdomen:  Bowel sounds present, rounded but soft, non-tender, non-distended. No rebound or guarding. Msk:  Symmetrical without gross deformities. Extremities:  Without clubbing or edema. Neurologic:  Alert and grossly normal neurologically. Child-like in demeanor. Skin:  Warm and dry, intact without significant lesions.  Psych:  Alert and cooperative. Normal mood and affect.  Intake/Output from previous day: 06/19 0701 - 06/20 0700 In: 542.5 [P.O.:360; I.V.:182.5] Out: -  Intake/Output this shift:    Lab Results:  Recent Labs  01/22/16 1410 01/23/16 0428  WBC 9.2 7.5  HGB 8.7* 8.8*  HCT 27.6* 27.7*  PLT 308 349   BMET  Recent Labs  01/22/16 1410 01/23/16 0428  NA 138 144  K 3.6 4.0  CL 107 111  CO2 25 27  GLUCOSE 157* 97  BUN 12 8  CREATININE 0.87 0.76  CALCIUM 8.3* 8.3*   LFT No results for input(s): PROT, ALBUMIN, AST, ALT, ALKPHOS, BILITOT, BILIDIR, IBILI in the last 72 hours. PT/INR No results for input(s): LABPROT, INR in the last 72 hours. Hepatitis Panel No results for input(s): HEPBSAG, HCVAB, HEPAIGM, HEPBIGM in the last 72 hours.   Studies/Results: No results found.  Assessment: 60 year old female with a history of IDA unable to be fully evaluated due to multiple failed colnooscopy attempts due to poor prep. She lives  in a group home that does not have 24-hour supervision and likely eating solid foods after hours. At this point the decision was made to admit the patient to 23 hour observation through Dr. Legrand Rams in order to provide supervised bowel prep. Generally she is asymptomatic from a GI standpoint, although I am unsure of how much she is able to understand or convey due to her history of schizophrenia.  Her last hemoglobin noted 10/13/2015 at 8.3 with an MCV 59.3 and MCH 17.0, platelet count 425. Her last ferritin also on 10/13/2015 was low at 10. This morning nursing staff states patient completed her bowel prep as ordered. However, had a solid, dark brown stool this morning. Given this, she is likely not able to have colonoscopy today due to poor prep. After extended prep yesterday/today, BMP shows normal kidney function,  no sodium/potassium abnormalities.   Plan: 1. Hold planned colonoscopy for today 2. Additional prep tonight/tomorrow: GoLytely 4 L tonight and 2L early tomorrow morning. 3. Tap water enema tomorrow x 2 after prep and before procedure 4. Colonoscopy with propofol/MAC tomorrow 5. Clear liquids today 6. NPO after midnight. 7. Supportive measures and reassurance    Walden Field, AGNP-C Adult & Gerontological Nurse Practitioner [facility]      01/23/2016, 1:08 PM

## 2016-01-23 NOTE — Anesthesia Preprocedure Evaluation (Deleted)
Anesthesia Evaluation  Patient identified by MRN, date of birth, ID band Patient awake    Reviewed: Allergy & Precautions, NPO status , Patient's Chart, lab work & pertinent test results  Airway        Dental   Pulmonary former smoker,    breath sounds clear to auscultation       Cardiovascular negative cardio ROS   Rhythm:Regular Rate:Normal     Neuro/Psych PSYCHIATRIC DISORDERS Schizophrenia    GI/Hepatic GERD  ,  Endo/Other  diabetes, Type 2  Renal/GU      Musculoskeletal   Abdominal   Peds  Hematology  (+) anemia ,   Anesthesia Other Findings   Reproductive/Obstetrics                             Anesthesia Physical Anesthesia Plan  ASA: III  Anesthesia Plan: MAC   Post-op Pain Management:    Induction: Intravenous  Airway Management Planned: Simple Face Mask  Additional Equipment:   Intra-op Plan:   Post-operative Plan:   Informed Consent: I have reviewed the patients History and Physical, chart, labs and discussed the procedure including the risks, benefits and alternatives for the proposed anesthesia with the patient or authorized representative who has indicated his/her understanding and acceptance.     Plan Discussed with:   Anesthesia Plan Comments:         Anesthesia Quick Evaluation

## 2016-01-24 ENCOUNTER — Encounter (HOSPITAL_COMMUNITY): Payer: Self-pay | Admitting: *Deleted

## 2016-01-24 ENCOUNTER — Encounter (HOSPITAL_COMMUNITY)
Admission: RE | Disposition: A | Discharge status: Nursing Home (Includes ICF) | Payer: Self-pay | Source: Ambulatory Visit | Attending: Internal Medicine

## 2016-01-24 ENCOUNTER — Observation Stay (HOSPITAL_COMMUNITY): Payer: Medicaid Other | Admitting: Anesthesiology

## 2016-01-24 DIAGNOSIS — C189 Malignant neoplasm of colon, unspecified: Secondary | ICD-10-CM | POA: Diagnosis not present

## 2016-01-24 DIAGNOSIS — K219 Gastro-esophageal reflux disease without esophagitis: Secondary | ICD-10-CM | POA: Diagnosis not present

## 2016-01-24 DIAGNOSIS — D509 Iron deficiency anemia, unspecified: Secondary | ICD-10-CM | POA: Diagnosis not present

## 2016-01-24 HISTORY — PX: BIOPSY: SHX5522

## 2016-01-24 HISTORY — PX: COLONOSCOPY WITH PROPOFOL: SHX5780

## 2016-01-24 LAB — GLUCOSE, CAPILLARY
GLUCOSE-CAPILLARY: 95 mg/dL (ref 65–99)
GLUCOSE-CAPILLARY: 117 mg/dL — AB (ref 65–99)
GLUCOSE-CAPILLARY: 135 mg/dL — AB (ref 65–99)
GLUCOSE-CAPILLARY: 135 mg/dL — AB (ref 65–99)
Glucose-Capillary: 85 mg/dL (ref 65–99)
Glucose-Capillary: 100 mg/dL — ABNORMAL HIGH (ref 65–99)

## 2016-01-24 SURGERY — COLONOSCOPY WITH PROPOFOL
Anesthesia: Monitor Anesthesia Care

## 2016-01-24 MED ORDER — MIDAZOLAM HCL 2 MG/2ML IJ SOLN
INTRAMUSCULAR | Status: AC
  Filled 2016-01-24: qty 2

## 2016-01-24 MED ORDER — GLYCOPYRROLATE 0.2 MG/ML IJ SOLN
0.2000 mg | Freq: Once | INTRAMUSCULAR | Status: AC | PRN
  Administered 2016-01-24: 0.2 mg via INTRAVENOUS

## 2016-01-24 MED ORDER — LIDOCAINE HCL (CARDIAC) 10 MG/ML IV SOLN
INTRAVENOUS | Status: DC | PRN
  Administered 2016-01-24: 25 mg via INTRAVENOUS

## 2016-01-24 MED ORDER — EPHEDRINE SULFATE 50 MG/ML IJ SOLN
INTRAMUSCULAR | Status: DC | PRN
  Administered 2016-01-24 (×3): 10 mg via INTRAVENOUS

## 2016-01-24 MED ORDER — LIDOCAINE VISCOUS 2 % MT SOLN
OROMUCOSAL | Status: AC
  Filled 2016-01-24: qty 15

## 2016-01-24 MED ORDER — LACTATED RINGERS IV SOLN
INTRAVENOUS | Status: DC
  Administered 2016-01-24: 13:00:00 via INTRAVENOUS

## 2016-01-24 MED ORDER — FENTANYL CITRATE (PF) 100 MCG/2ML IJ SOLN
25.0000 ug | INTRAMUSCULAR | Status: DC | PRN
  Administered 2016-01-24: 25 ug via INTRAVENOUS

## 2016-01-24 MED ORDER — ONDANSETRON HCL 4 MG/2ML IJ SOLN
INTRAMUSCULAR | Status: AC
  Filled 2016-01-24: qty 2

## 2016-01-24 MED ORDER — PEG 3350-KCL-NA BICARB-NACL 420 G PO SOLR
ORAL | Status: AC
  Filled 2016-01-24: qty 4000

## 2016-01-24 MED ORDER — LIDOCAINE VISCOUS 2 % MT SOLN
OROMUCOSAL | Status: DC | PRN
  Administered 2016-01-24: 1 via OROMUCOSAL

## 2016-01-24 MED ORDER — SODIUM CHLORIDE 0.9 % IV SOLN
INTRAVENOUS | Status: DC
Start: 2016-01-24 — End: 2016-01-24

## 2016-01-24 MED ORDER — MIDAZOLAM HCL 2 MG/2ML IJ SOLN
1.0000 mg | INTRAMUSCULAR | Status: DC | PRN
  Administered 2016-01-24: 2 mg via INTRAVENOUS

## 2016-01-24 MED ORDER — PROPOFOL 10 MG/ML IV BOLUS
INTRAVENOUS | Status: AC
  Filled 2016-01-24: qty 20

## 2016-01-24 MED ORDER — GLYCOPYRROLATE 0.2 MG/ML IJ SOLN
INTRAMUSCULAR | Status: AC
  Filled 2016-01-24: qty 1

## 2016-01-24 MED ORDER — MIDAZOLAM HCL 5 MG/5ML IJ SOLN
INTRAMUSCULAR | Status: DC | PRN
  Administered 2016-01-24 (×2): 1 mg via INTRAVENOUS

## 2016-01-24 MED ORDER — PROPOFOL 500 MG/50ML IV EMUL
INTRAVENOUS | Status: DC | PRN
  Administered 2016-01-24: 25 ug/kg/min via INTRAVENOUS

## 2016-01-24 MED ORDER — FENTANYL CITRATE (PF) 100 MCG/2ML IJ SOLN
INTRAMUSCULAR | Status: AC
  Filled 2016-01-24: qty 2

## 2016-01-24 MED ORDER — ONDANSETRON HCL 4 MG/2ML IJ SOLN
4.0000 mg | Freq: Once | INTRAMUSCULAR | Status: AC
  Administered 2016-01-24: 4 mg via INTRAVENOUS

## 2016-01-24 MED ORDER — LIDOCAINE HCL (PF) 1 % IJ SOLN
INTRAMUSCULAR | Status: AC
  Filled 2016-01-24: qty 5

## 2016-01-24 NOTE — Transfer of Care (Signed)
Immediate Anesthesia Transfer of Care Note  Patient: Mary Wells  Procedure(s) Performed: Procedure(s) with comments: COLONOSCOPY WITH PROPOFOL (N/A) BIOPSY - cecal mass  Patient Location: PACU  Anesthesia Type:MAC  Level of Consciousness: awake, alert , oriented and patient cooperative  Airway & Oxygen Therapy: Patient Spontanous Breathing and Patient connected to face mask oxygen  Post-op Assessment: Report given to RN and Post -op Vital signs reviewed and stable  Post vital signs: Reviewed and stable  Last Vitals:  Filed Vitals:   01/24/16 0555 01/24/16 1200  BP: 89/51 102/64  Pulse: 57 77  Temp: 36.7 C 36.9 C  Resp: 18 14    Last Pain: There were no vitals filed for this visit.       Complications: No apparent anesthesia complications

## 2016-01-24 NOTE — Anesthesia Procedure Notes (Signed)
Procedure Name: MAC Date/Time: 01/24/2016 12:44 PM Performed by: Andree Elk, Kimisha Eunice A Pre-anesthesia Checklist: Patient identified, Emergency Drugs available, Suction available, Patient being monitored and Timeout performed Patient Re-evaluated:Patient Re-evaluated prior to inductionOxygen Delivery Method: Simple face mask

## 2016-01-24 NOTE — Care Management Note (Signed)
Case Management Note  Patient Details  Name: Mary Wells MRN: [id number] Date of Birth: 11-09-1955  Subjective/Objective: Patient is from [facility] home. She is here to have an adequate bowel prep for colonoscopy.              Action/Plan: Anticipate patient will return to rest home at discharge. Will follow.    Expected Discharge Date:       01/24/2016           Expected Discharge Plan:  Rest Home  In-House Referral:     Discharge planning Services  CM Consult  Post Acute Care Choice:  NA Choice offered to:  NA  DME Arranged:    DME Agency:     HH Arranged:    HH Agency:     Status of Service:  In process, will continue to follow  If discussed at Long Length of Stay Meetings, dates discussed:    Additional Comments:  Zadkiel Dragan, Chauncey Reading, RN 01/24/2016, 3:17 PM

## 2016-01-24 NOTE — Anesthesia Postprocedure Evaluation (Signed)
Anesthesia Post Note  Patient: Mary Wells  Procedure(s) Performed: Procedure(s) (LRB): COLONOSCOPY WITH PROPOFOL (N/A) BIOPSY  Patient location during evaluation: PACU Anesthesia Type: MAC Level of consciousness: awake and alert and oriented Pain management: pain level controlled Vital Signs Assessment: post-procedure vital signs reviewed and stable Respiratory status: spontaneous breathing and patient connected to face mask oxygen Cardiovascular status: stable Postop Assessment: no signs of nausea or vomiting Anesthetic complications: no    Last Vitals:  Filed Vitals:   01/24/16 0555 01/24/16 1200  BP: 89/51 102/64  Pulse: 57 77  Temp: 36.7 C 36.9 C  Resp: 18 14    Last Pain: There were no vitals filed for this visit.               ADAMS, AMY A

## 2016-01-24 NOTE — Progress Notes (Signed)
Subjective: Patient is alert and awake. She is resting. Patient is planned for EGD and colonoscopy.  Objective: Vital signs in last 24 hours: Temp:  [97.8 F (36.6 C)-98.1 F (36.7 C)] 98 F (36.7 C) (06/21 0555) Pulse Rate:  [57-75] 57 (06/21 0555) Resp:  [18-20] 18 (06/21 0555) BP: (89-107)/(51-90) 89/51 mmHg (06/21 0555) SpO2:  [99 %-100 %] 99 % (06/21 0555) Weight:  [69.4 kg (153 lb)] 69.4 kg (153 lb) (06/21 0700) Weight change: 4.037 kg (8 lb 14.4 oz) Last BM Date: 01/24/16  Intake/Output from previous day: 06/20 0701 - 06/21 0700 In: 29 [P.O.:560] Out: -   PHYSICAL EXAM General appearance: alert and no distress Resp: clear to auscultation bilaterally Cardio: S1, S2 normal GI: soft, non-tender; bowel sounds normal; no masses,  no organomegaly Extremities: extremities normal, atraumatic, no cyanosis or edema  Lab Results:  Results for orders placed or performed during the hospital encounter of 01/22/16 (from the past 48 hour(s))  MRSA PCR Screening     Status: None   Collection Time: 01/22/16 12:42 PM  Result Value Ref Range   MRSA by PCR NEGATIVE NEGATIVE    Comment:        The GeneXpert MRSA Assay (FDA approved for NASAL specimens only), is one component of a comprehensive MRSA colonization surveillance program. It is not intended to diagnose MRSA infection nor to guide or monitor treatment for MRSA infections.   CBC     Status: Abnormal   Collection Time: 01/22/16  2:10 PM  Result Value Ref Range   WBC 9.2 4.0 - 10.5 K/uL   RBC 3.91 3.87 - 5.11 MIL/uL   Hemoglobin 8.7 (L) 12.0 - 15.0 g/dL   HCT 27.6 (L) 36.0 - 46.0 %   MCV 70.6 (L) 78.0 - 100.0 fL   MCH 22.3 (L) 26.0 - 34.0 pg   MCHC 31.5 30.0 - 36.0 g/dL   RDW 18.3 (H) 11.5 - 15.5 %   Platelets 308 150 - 400 K/uL  Basic metabolic panel     Status: Abnormal   Collection Time: 01/22/16  2:10 PM  Result Value Ref Range   Sodium 138 135 - 145 mmol/L   Potassium 3.6 3.5 - 5.1 mmol/L   Chloride 107  101 - 111 mmol/L   CO2 25 22 - 32 mmol/L   Glucose, Bld 157 (H) 65 - 99 mg/dL   BUN 12 6 - 20 mg/dL   Creatinine, Ser 0.87 0.44 - 1.00 mg/dL   Calcium 8.3 (L) 8.9 - 10.3 mg/dL   GFR calc non Af Amer >60 >60 mL/min   GFR calc Af Amer >60 >60 mL/min    Comment: (NOTE) The eGFR has been calculated using the CKD EPI equation. This calculation has not been validated in all clinical situations. eGFR's persistently <60 mL/min signify possible Chronic Kidney Disease.    Anion gap 6 5 - 15  Glucose, capillary     Status: None   Collection Time: 01/22/16  4:42 PM  Result Value Ref Range   Glucose-Capillary 92 65 - 99 mg/dL   Comment 1 Document in Chart   Glucose, capillary     Status: Abnormal   Collection Time: 01/22/16 10:39 PM  Result Value Ref Range   Glucose-Capillary 103 (H) 65 - 99 mg/dL   Comment 1 Notify RN    Comment 2 Document in Chart   CBC     Status: Abnormal   Collection Time: 01/23/16  4:28 AM  Result Value Ref Range  WBC 7.5 4.0 - 10.5 K/uL   RBC 3.91 3.87 - 5.11 MIL/uL   Hemoglobin 8.8 (L) 12.0 - 15.0 g/dL   HCT 27.7 (L) 36.0 - 46.0 %   MCV 70.8 (L) 78.0 - 100.0 fL   MCH 22.5 (L) 26.0 - 34.0 pg   MCHC 31.8 30.0 - 36.0 g/dL   RDW 18.3 (H) 11.5 - 15.5 %   Platelets 349 150 - 400 K/uL  Basic metabolic panel     Status: Abnormal   Collection Time: 01/23/16  4:28 AM  Result Value Ref Range   Sodium 144 135 - 145 mmol/L   Potassium 4.0 3.5 - 5.1 mmol/L   Chloride 111 101 - 111 mmol/L   CO2 27 22 - 32 mmol/L   Glucose, Bld 97 65 - 99 mg/dL   BUN 8 6 - 20 mg/dL   Creatinine, Ser 0.76 0.44 - 1.00 mg/dL   Calcium 8.3 (L) 8.9 - 10.3 mg/dL   GFR calc non Af Amer >60 >60 mL/min   GFR calc Af Amer >60 >60 mL/min    Comment: (NOTE) The eGFR has been calculated using the CKD EPI equation. This calculation has not been validated in all clinical situations. eGFR's persistently <60 mL/min signify possible Chronic Kidney Disease.    Anion gap 6 5 - 15  Glucose,  capillary     Status: Abnormal   Collection Time: 01/23/16  7:44 AM  Result Value Ref Range   Glucose-Capillary 100 (H) 65 - 99 mg/dL  Glucose, capillary     Status: None   Collection Time: 01/23/16 11:21 AM  Result Value Ref Range   Glucose-Capillary 98 65 - 99 mg/dL  Glucose, capillary     Status: None   Collection Time: 01/23/16  4:46 PM  Result Value Ref Range   Glucose-Capillary 87 65 - 99 mg/dL   Comment 1 Notify RN   Glucose, capillary     Status: None   Collection Time: 01/23/16  8:43 PM  Result Value Ref Range   Glucose-Capillary 82 65 - 99 mg/dL   Comment 1 Notify RN    Comment 2 Document in Chart   Glucose, capillary     Status: None   Collection Time: 01/24/16  6:13 AM  Result Value Ref Range   Glucose-Capillary 95 65 - 99 mg/dL  Glucose, capillary     Status: None   Collection Time: 01/24/16  7:58 AM  Result Value Ref Range   Glucose-Capillary 85 65 - 99 mg/dL    ABGS No results for input(s): PHART, PO2ART, TCO2, HCO3 in the last 72 hours.  Invalid input(s): PCO2 CULTURES Recent Results (from the past 240 hour(s))  MRSA PCR Screening     Status: None   Collection Time: 01/22/16 12:42 PM  Result Value Ref Range Status   MRSA by PCR NEGATIVE NEGATIVE Final    Comment:        The GeneXpert MRSA Assay (FDA approved for NASAL specimens only), is one component of a comprehensive MRSA colonization surveillance program. It is not intended to diagnose MRSA infection nor to guide or monitor treatment for MRSA infections.    Studies/Results: No results found.  Medications: I have reviewed the patient's current medications.  Assesment:   Active Problems:   Diabetes mellitus (HCC)   Schizophrenia (HCC)   Iron deficiency anemia   HLD (hyperlipidemia)   GERD (gastroesophageal reflux disease)    Plan:  Medications reviewed As per GI plan  Mary Wells 01/24/2016, 8:10 AM

## 2016-01-24 NOTE — Progress Notes (Signed)
Patient drank all of golytely from Tuesday.  Patient refused to drink doses of miralax that were ordered, as she drank rest of golytely for most of pm shift and was having difficulty with nausea. Patient did take dulcolax that was ordered per md.  Patient has had several loose/liquid stools that were green in color. Otherwise patient was in stable condition during night.

## 2016-01-24 NOTE — Progress Notes (Signed)
Patient given 2 tap water enemas  - results clear water with minimal small debris.

## 2016-01-24 NOTE — Progress Notes (Signed)
Evaluated patient at bedside. Per nursing note from overnight. Patient consume 4liters of Golytely but took her all night. Did not consume Miralax written by Dr. Oneida Alar.   This morning she has drank 1 liter of golytely in my presence. She had large green watery stool. Patient completing second liter of golytely that was ordered for this morning. Discussed with NT who will encourage her to complete. Meagan RN will call if not clean after tap water enemas.   Addendum: 9:44. Patient only with two glasses of golytely left. Enemas pending. Advised Meagan to stop all oral medications by 11.

## 2016-01-24 NOTE — Op Note (Addendum)
[facility] Patient Name: Mary Wells Procedure Date: 01/24/2016 12:48 PM MRN: [id number] Date of Birth: [birth year] Attending MD: Norvel Richards , MD CSN: [id number] Age: 60 Admit Type: Outpatient Procedure:                Colonoscopy with biopsy Indications:              Unexplained iron deficiency anemia Providers:                Norvel Richards, MD, Janeece Riggers, RN, Georgeann Oppenheim, Technician Referring MD:              Medicines:                Propofol per Anesthesia Complications:            No immediate complications. Estimated Blood Loss:     Estimated blood loss was minimal. Procedure:                Pre-Anesthesia Assessment:                           - Prior to the procedure, a History and Physical                            was performed, and patient medications and                            allergies were reviewed. The patient's tolerance of                            previous anesthesia was also reviewed. The risks                            and benefits of the procedure and the sedation                            options and risks were discussed with the patient.                            All questions were answered, and informed consent                            was obtained. Prior Anticoagulants: The patient has                            taken no previous anticoagulant or antiplatelet                            agents. ASA Grade Assessment: III - A patient with                            severe systemic disease. After reviewing the risks  and benefits, the patient was deemed in                            satisfactory condition to undergo the procedure.                           After obtaining informed consent, the colonoscope                            was passed under direct vision. Throughout the                            procedure, the patient's blood pressure, pulse, and      oxygen saturations were monitored continuously. The                            Colonoscope was introduced through the anus and                            advanced to the the cecum, identified by the                            ileocecal valve. The colonoscopy was technically                            difficult and complex due to significant looping.                            The patient tolerated the procedure well. The                            quality of the bowel preparation was adequate. The                            ileocecal valve and the rectum were photographed. Scope In: 12:57:35 PM Scope Out: 1:49:18 PM Scope Withdrawal Time: 0 hours 7 minutes 6 seconds  Total Procedure Duration: 0 hours 51 minutes 43 seconds  Findings:      The perianal and digital rectal examinations were normal.      Large fungating, exophytic tumor appears to be arising out of the base       of cecum / appendix. It's partially obscuring the ileocecal valve.       Please see multiple photographs. The colon was extremely tortuous and       redundant. It was a struggle to make it to the cecum utilizing repeated       changes in the patient's position and external counterpressure. Multiple       biopsies of the cecal lesion were taken. The remainder of the rectonic       mucosa. Preparation was excellent. 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 Tortuous  colon..                           - Moderate Sedation:      Moderate (conscious) sedation was personally administered by an       anesthesia professional. The following parameters were monitored: oxygen       saturation, heart rate, blood pressure, respiratory rate, EKG, adequacy       of pulmonary ventilation, and response to care. Total physician       intraservice time was 63 minutes. Recommendation:           - Clear liquid diet.  CAT scan of the abdomen and                            pelvis. Surgery and oncology consultations                            recommended. Follow up on pathology.                           - Return to GI office (date not yet determined).                           - Repeat colonoscopy is recommended for                            surveillance. The colonoscopy date will be                            determined after pathology results from today's                            exam become available for review. Procedure Code(s):        --- Professional ---                           [phone], Colonoscopy, flexible; diagnostic, including                            collection of specimen(s) by brushing or washing,                            when performed (separate procedure) Diagnosis Code(s):        --- Professional ---                           K63.89, Other specified diseases of intestine                           D50.9, Iron deficiency anemia, unspecified CPT copyright 2016 American Medical Association. All rights reserved. The codes documented in this report are preliminary and upon coder review may  be revised to meet current compliance requirements. Cristopher Estimable. Chena Chohan, MD Norvel Richards, MD 01/24/2016 2:20:57 PM This report has been signed electronically. Number of Addenda: 0

## 2016-01-24 NOTE — Anesthesia Preprocedure Evaluation (Signed)
Anesthesia Evaluation  Patient identified by MRN, date of birth, ID band Patient confused    Reviewed: Allergy & Precautions, NPO status , Patient's Chart, lab work & pertinent test results  Airway Mallampati: I  TM Distance: >3 FB     Dental  (+) Edentulous Upper, Poor Dentition   Pulmonary former smoker,    breath sounds clear to auscultation       Cardiovascular negative cardio ROS   Rhythm:Regular Rate:Normal     Neuro/Psych PSYCHIATRIC DISORDERS Schizophrenia    GI/Hepatic GERD  ,  Endo/Other  diabetes, Type 2, Insulin Dependent  Renal/GU      Musculoskeletal   Abdominal   Peds  Hematology  (+) anemia ,   Anesthesia Other Findings   Reproductive/Obstetrics                             Anesthesia Physical Anesthesia Plan  ASA: III  Anesthesia Plan: MAC   Post-op Pain Management:    Induction: Intravenous  Airway Management Planned: Simple Face Mask  Additional Equipment:   Intra-op Plan:   Post-operative Plan:   Informed Consent: I have reviewed the patients History and Physical, chart, labs and discussed the procedure including the risks, benefits and alternatives for the proposed anesthesia with the patient or authorized representative who has indicated his/her understanding and acceptance.     Plan Discussed with:   Anesthesia Plan Comments:         Anesthesia Quick Evaluation

## 2016-01-25 ENCOUNTER — Other Ambulatory Visit: Payer: Self-pay

## 2016-01-25 ENCOUNTER — Observation Stay (HOSPITAL_COMMUNITY): Payer: Medicaid Other

## 2016-01-25 DIAGNOSIS — Z9119 Patient's noncompliance with other medical treatment and regimen: Secondary | ICD-10-CM | POA: Diagnosis not present

## 2016-01-25 DIAGNOSIS — Z87891 Personal history of nicotine dependence: Secondary | ICD-10-CM | POA: Diagnosis not present

## 2016-01-25 DIAGNOSIS — Z806 Family history of leukemia: Secondary | ICD-10-CM | POA: Diagnosis not present

## 2016-01-25 DIAGNOSIS — Z7984 Long term (current) use of oral hypoglycemic drugs: Secondary | ICD-10-CM | POA: Diagnosis not present

## 2016-01-25 DIAGNOSIS — K639 Disease of intestine, unspecified: Secondary | ICD-10-CM | POA: Diagnosis present

## 2016-01-25 DIAGNOSIS — C189 Malignant neoplasm of colon, unspecified: Secondary | ICD-10-CM | POA: Diagnosis present

## 2016-01-25 DIAGNOSIS — D374 Neoplasm of uncertain behavior of colon: Secondary | ICD-10-CM | POA: Diagnosis not present

## 2016-01-25 DIAGNOSIS — E785 Hyperlipidemia, unspecified: Secondary | ICD-10-CM | POA: Diagnosis present

## 2016-01-25 DIAGNOSIS — I517 Cardiomegaly: Secondary | ICD-10-CM | POA: Diagnosis not present

## 2016-01-25 DIAGNOSIS — F209 Schizophrenia, unspecified: Secondary | ICD-10-CM | POA: Diagnosis present

## 2016-01-25 DIAGNOSIS — D509 Iron deficiency anemia, unspecified: Secondary | ICD-10-CM | POA: Diagnosis present

## 2016-01-25 DIAGNOSIS — K6389 Other specified diseases of intestine: Secondary | ICD-10-CM | POA: Diagnosis not present

## 2016-01-25 DIAGNOSIS — K219 Gastro-esophageal reflux disease without esophagitis: Secondary | ICD-10-CM | POA: Diagnosis present

## 2016-01-25 DIAGNOSIS — E119 Type 2 diabetes mellitus without complications: Secondary | ICD-10-CM | POA: Diagnosis present

## 2016-01-25 LAB — FERRITIN: FERRITIN: 61 ng/mL (ref 11–307)

## 2016-01-25 LAB — IRON AND TIBC
IRON: 14 ug/dL — AB (ref 28–170)
SATURATION RATIOS: 8 % — AB (ref 10.4–31.8)
TIBC: 165 ug/dL — AB (ref 250–450)
UIBC: 151 ug/dL

## 2016-01-25 LAB — GLUCOSE, CAPILLARY
Glucose-Capillary: 101 mg/dL — ABNORMAL HIGH (ref 65–99)
Glucose-Capillary: 99 mg/dL (ref 65–99)

## 2016-01-25 MED ORDER — SODIUM CHLORIDE 0.9 % IV SOLN: Freq: Once | INTRAVENOUS | Status: DC

## 2016-01-25 MED ORDER — DIATRIZOATE MEGLUMINE & SODIUM 66-10 % PO SOLN
ORAL | Status: AC
  Filled 2016-01-25: qty 30

## 2016-01-25 MED ORDER — IOPAMIDOL (ISOVUE-300) INJECTION 61%
100.0000 mL | Freq: Once | INTRAVENOUS | Status: AC | PRN
  Administered 2016-01-25: 100 mL via INTRAVENOUS

## 2016-01-25 NOTE — Progress Notes (Signed)
Patient's chart is reviewed.  Mary Wells is well known to the [facility] for her iron deficiency anemia, but is very noncompliant with follow-up appointments and lab appointments.  She has gone missing yet again and was last seen in the clinic on 10/12/2015 after urgently re-referred by her primary care provider for severe iron deficiency anemia.  She has failed to follow with GI as well and her last colonoscopy was by Dr. Oneida Alar on 12/04/2010 (prior to [facility] on [facility]).  Chart reviewed.  Oncology will wait for pathology and Gen Surg consultation.    I have ordered a CT of chest w contrast to complete staging tests.  I have ordered a CEA for baseline value.  I have ordered iron/TIBC, ferritin as well.  I suspect she is again iron deficient and if these results prove IDA, will calculate her iron deficit and order IV iron accordingly.    Depending on hospital course, patient can be seen by oncology as an inpatient versus urgent outpatient.  Will see what her staging tests demonstrate.  Possible surgical intervention +/- port placement.    Oncology will follow from the periphery at this time.  Please call oncology with any questions or concerns.  Patient not seen, no charge placed.  Robynn Pane, PA-C 01/25/2016 4:04 PM

## 2016-01-25 NOTE — Progress Notes (Signed)
Subjective: Patient had her procedure yesterday and was found to have large fungating mass o her cecum. CT of the abdomen and pelvis is ordered. She is resting. No new com[plaint.  Objective: Vital signs in last 24 hours: Temp:  [98.2 F (36.8 C)-98.5 F (36.9 C)] 98.4 F (36.9 C) (06/22 0417) Pulse Rate:  [67-97] 84 (06/22 0417) Resp:  [14-23] 20 (06/22 0417) BP: (96-113)/(55-73) 113/70 mmHg (06/22 0417) SpO2:  [96 %-100 %] 99 % (06/22 0417) Weight:  [69.4 kg (153 lb)] 69.4 kg (153 lb) (06/21 1200) Weight change: 0 kg (0 lb) Last BM Date: 01/24/16  Intake/Output from previous day: 06/21 0701 - 06/22 0700 In: 1140 [P.O.:240; I.V.:900] Out: 2901 [Urine:2901]  PHYSICAL EXAM General appearance: alert and no distress Resp: clear to auscultation bilaterally Cardio: S1, S2 normal GI: soft, non-tender; bowel sounds normal; no masses,  no organomegaly Extremities: extremities normal, atraumatic, no cyanosis or edema  Lab Results:  Results for orders placed or performed during the hospital encounter of 01/22/16 (from the past 48 hour(s))  Glucose, capillary     Status: None   Collection Time: 01/23/16 11:21 AM  Result Value Ref Range   Glucose-Capillary 98 65 - 99 mg/dL  Glucose, capillary     Status: None   Collection Time: 01/23/16  4:46 PM  Result Value Ref Range   Glucose-Capillary 87 65 - 99 mg/dL   Comment 1 Notify RN   Glucose, capillary     Status: None   Collection Time: 01/23/16  8:43 PM  Result Value Ref Range   Glucose-Capillary 82 65 - 99 mg/dL   Comment 1 Notify RN    Comment 2 Document in Chart   Glucose, capillary     Status: None   Collection Time: 01/24/16  6:13 AM  Result Value Ref Range   Glucose-Capillary 95 65 - 99 mg/dL  Glucose, capillary     Status: None   Collection Time: 01/24/16  7:58 AM  Result Value Ref Range   Glucose-Capillary 85 65 - 99 mg/dL  Glucose, capillary     Status: Abnormal   Collection Time: 01/24/16 12:10 PM  Result Value Ref  Range   Glucose-Capillary 100 (H) 65 - 99 mg/dL  Glucose, capillary     Status: Abnormal   Collection Time: 01/24/16  2:02 PM  Result Value Ref Range   Glucose-Capillary 117 (H) 65 - 99 mg/dL  Glucose, capillary     Status: Abnormal   Collection Time: 01/24/16  4:32 PM  Result Value Ref Range   Glucose-Capillary 135 (H) 65 - 99 mg/dL   Comment 1 Notify RN    Comment 2 Document in Chart   Glucose, capillary     Status: Abnormal   Collection Time: 01/24/16  8:48 PM  Result Value Ref Range   Glucose-Capillary 135 (H) 65 - 99 mg/dL   Comment 1 Notify RN    Comment 2 Document in Chart   Glucose, capillary     Status: Abnormal   Collection Time: 01/25/16  7:47 AM  Result Value Ref Range   Glucose-Capillary 101 (H) 65 - 99 mg/dL    ABGS No results for input(s): PHART, PO2ART, TCO2, HCO3 in the last 72 hours.  Invalid input(s): PCO2 CULTURES Recent Results (from the past 240 hour(s))  MRSA PCR Screening     Status: None   Collection Time: 01/22/16 12:42 PM  Result Value Ref Range Status   MRSA by PCR NEGATIVE NEGATIVE Final    Comment:  The GeneXpert MRSA Assay (FDA approved for NASAL specimens only), is one component of a comprehensive MRSA colonization surveillance program. It is not intended to diagnose MRSA infection nor to guide or monitor treatment for MRSA infections.    Studies/Results: No results found.  Medications: I have reviewed the patient's current medications.  Assesment:   Active Problems:   Diabetes mellitus (HCC)   Schizophrenia (HCC)   Iron deficiency anemia   HLD (hyperlipidemia)   GERD (gastroesophageal reflux disease)   Colon tumor      Plan:  Medications reviewed Will follow biopsy result Will follow CT Scan result Surgery consult Oncology consult     [place] Oborn 01/25/2016, 8:11 AM

## 2016-01-25 NOTE — Consult Note (Signed)
Reason for Consult: Colon mass Referring Physician: Dr. Elmer Picker is an 60 y.o. female.  HPI: Patient is a 60 year old black female who suffers from schizophrenia who is having a long-standing history of iron deficiency anemia. She ultimately presented to the hospital for a colonoscopy for workup of her anemia. She was found to have a large cecal mass. Biopsy results are still pending. Patient denies any abdominal pain, weight loss, nausea, vomiting, blood in her stools, or family history of colon carcinoma.  Past Medical History  Diagnosis Date  . Schizophrenia ([place])   . GERD (gastroesophageal reflux disease)   . Allergy   . Diabetes mellitus without complication ([place]) 123456  . Iron deficiency anemia 09/18/2013    Past Surgical History  Procedure Laterality Date  . Colonoscopy  04/21/2006    SLF: limited colonoscopy due to a poor bowel prep   . Colonoscopy  02/22/2009    SLF: poor bowel prep/Formed stools in the right colon which limited the extent of the exam/The scope was passed to approximately the proximal transverse colon.  No polyps, masses, inflammatory changes, diverticular/normal rectum  . Excisional hemorrhoidectomy      Family History  Problem Relation Age of Onset  . Leukemia Paternal Grandfather   . Leukemia Cousin   . Colon cancer Neg Hx     Social History:  reports that she has quit smoking. She does not have any smokeless tobacco history on file. She reports that she does not drink alcohol or use illicit drugs.  Allergies: No Known Allergies  Medications:  Prior to Admission:  Prescriptions prior to admission  Medication Sig Dispense Refill Last Dose  . benztropine (COGENTIN) 2 MG tablet Take 1 mg by mouth 2 (two) times daily.    01/22/2016 at Unknown time  . clonazePAM (KLONOPIN) 0.5 MG tablet Take 0.5 mg by mouth 2 (two) times daily as needed for anxiety (prn agitation).   unknown  . divalproex (DEPAKOTE ER) 250 MG 24 hr tablet Take 250 mg by mouth at  bedtime.   01/21/2016 at Unknown time  . docusate sodium (COLACE) 100 MG capsule Take 100 mg by mouth daily as needed for mild constipation.   unknown  . famotidine (PEPCID) 20 MG tablet Take 20 mg by mouth daily.   01/22/2016 at Unknown time  . ferrous sulfate 325 (65 FE) MG tablet Take 325 mg by mouth 2 (two) times daily with a meal.   01/21/2016 at Unknown time  . fluPHENAZine (PROLIXIN) 5 MG tablet Take 5 mg by mouth 2 (two) times daily.   01/22/2016 at Unknown time  . loratadine (CLARITIN) 10 MG tablet Take 10 mg by mouth daily.   01/22/2016 at Unknown time  . metFORMIN (GLUCOPHAGE) 500 MG tablet Take 500 mg by mouth 2 (two) times daily with a meal.    01/22/2016 at Unknown time  . NON FORMULARY Take 1 capsule by mouth 2 (two) times daily. Fiber capsules 520 mg.   01/22/2016 at Unknown time  . simvastatin (ZOCOR) 20 MG tablet Take 20 mg by mouth daily.   01/22/2016 at Unknown time   Scheduled: . benztropine  1 mg Oral BID  . diatrizoate meglumine-sodium      . divalproex  250 mg Oral QHS  . famotidine  20 mg Oral Daily  . fluPHENAZine  5 mg Oral BID  . insulin aspart  0-5 Units Subcutaneous QHS  . insulin aspart  0-9 Units Subcutaneous TID WC  . linaclotide  290  mcg Oral QAC breakfast  . loratadine  10 mg Oral Daily  . simvastatin  20 mg Oral q1800    Results for orders placed or performed during the hospital encounter of 01/22/16 (from the past 48 hour(s))  Glucose, capillary     Status: None   Collection Time: 01/23/16  8:43 PM  Result Value Ref Range   Glucose-Capillary 82 65 - 99 mg/dL   Comment 1 Notify RN    Comment 2 Document in Chart   Glucose, capillary     Status: None   Collection Time: 01/24/16  6:13 AM  Result Value Ref Range   Glucose-Capillary 95 65 - 99 mg/dL  Glucose, capillary     Status: None   Collection Time: 01/24/16  7:58 AM  Result Value Ref Range   Glucose-Capillary 85 65 - 99 mg/dL  Glucose, capillary     Status: Abnormal   Collection Time: 01/24/16 12:10  PM  Result Value Ref Range   Glucose-Capillary 100 (H) 65 - 99 mg/dL  Glucose, capillary     Status: Abnormal   Collection Time: 01/24/16  2:02 PM  Result Value Ref Range   Glucose-Capillary 117 (H) 65 - 99 mg/dL  Glucose, capillary     Status: Abnormal   Collection Time: 01/24/16  4:32 PM  Result Value Ref Range   Glucose-Capillary 135 (H) 65 - 99 mg/dL   Comment 1 Notify RN    Comment 2 Document in Chart   Glucose, capillary     Status: Abnormal   Collection Time: 01/24/16  8:48 PM  Result Value Ref Range   Glucose-Capillary 135 (H) 65 - 99 mg/dL   Comment 1 Notify RN    Comment 2 Document in Chart   Glucose, capillary     Status: Abnormal   Collection Time: 01/25/16  7:47 AM  Result Value Ref Range   Glucose-Capillary 101 (H) 65 - 99 mg/dL  Glucose, capillary     Status: None   Collection Time: 01/25/16 11:15 AM  Result Value Ref Range   Glucose-Capillary 99 65 - 99 mg/dL    Ct Abdomen Pelvis W Contrast  01/25/2016  CLINICAL DATA:  Patient with history of large colonic mass. EXAM: CT ABDOMEN AND PELVIS WITH CONTRAST TECHNIQUE: Multidetector CT imaging of the abdomen and pelvis was performed using the standard protocol following bolus administration of intravenous contrast. CONTRAST:  124mL ISOVUE-300 IOPAMIDOL (ISOVUE-300) INJECTION 61% COMPARISON:  None. FINDINGS: Lower chest: Heart is enlarged. Trace pericardial effusion. Small bilateral pleural effusions. Lower lobe ground-glass consolidative opacities suggestive of atelectasis. Hepatobiliary: There is an 11 mm low-attenuation lesion within the inferior right hepatic lobe (image 40; series 2). There is an additional 6 mm low-attenuation lesion within the right hepatic lobe (image 22; series 2). There is a 7 mm low-attenuation lesion within the hepatic dome (image 7; series 2). Small amount of stones within the gallbladder lumen. Gallbladder is decompressed. Pancreas: Unremarkable Spleen: Unremarkable Adrenals/Urinary Tract: The  adrenal glands are normal. Kidneys enhance symmetrically with contrast. Multiple bilateral sub cm low-attenuation renal lesions are demonstrated, too small to accurately characterize. 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 Urinary bladder is unremarkable. Stomach/Bowel: There is marked irregular thickening of the colon the level of the hepatic flexure (image 38; series 2). The mass measures up to 9 cm. There is a small amount of surrounding free fluid. Additionally there is free fluid throughout the upper abdomen and bilateral paracolic gutters. Vascular/Lymphatic: Normal caliber abdominal aorta. Aortic vascular calcifications. There  is an 11 mm lymph node within the central mesentery (image 30; series 2). There is a 10 mm nodule adjacent to the hepatic flexure (image 57; series 3). There is a 13 mm soft tissue nodule within the right hemipelvis (image 80; series 2). Other: Uterus is unremarkable. Nonspecific radiodensity within the endometrial canal. Adnexal structures are unremarkable. Small amount of free fluid in the pelvis. Small fat containing left inguinal hernia. Musculoskeletal: There is an 11 mm lucent lesion within the L2 vertebral body, nonspecific however favored to represent a small hemangioma. IMPRESSION: There is a large (9 cm) mass within the colon at the level of the hepatic flexure most compatible primary colonic carcinoma. There is surrounding free fluid within the upper abdomen. There are multiple small adjacent nodules as well as enlarged mesenteric lymph nodes concerning for metastatic disease. There is a nonspecific 13 mm nodule within the right hemipelvis which may represent a metastatic deposit. Alternatively, given the appearance, this may represent a small amount of loculated fluid. There are nonspecific low-attenuation lesions within the liver, the majority which are subcentimeter in size. Metastatic disease is not excluded. Small pericardial effusion and bilateral  small pleural effusions. Aortic vascular calcifications. Electronically Signed   By: Lovey Newcomer M.D.   On: 01/25/2016 13:26    ROS:  A comprehensive review of systems was negative.  Blood pressure 93/54, pulse 60, temperature 98.4 F (36.9 C), temperature source Oral, resp. rate 18, height 5\' 4"  (1.626 m), weight 69.4 kg (153 lb), SpO2 100 %. Physical Exam: Pleasant black female in no acute distress. Head is normocephalic, atraumatic. Neck is supple without JVD, carotid bruits, or lymphadenopathy. Lungs clear to auscultation with equal breath sounds bilaterally. Heart examination reveals a regular rate and rhythm without S3, S4, murmurs. The abdomen is soft with some fullness along the right side of the abdomen. She does have bowel sounds. No hepatosplenomegaly, rigidity.  Assessment/Plan: Impression: Colonic neoplasm with possible metastatic disease. Highly likely that this is carcinoma. Will need partial colectomy and Port-A-Cath insertion. She will need a transfusion preoperatively. Due to the findings of cardiomegaly, will get 2-D echo tomorrow. Anticipate surgical intervention on 01/29/2016. Will let patient eat for now. CEA level is pending.  Moria Brophy A 01/25/2016, 5:21 PM

## 2016-01-25 NOTE — Addendum Note (Signed)
Addendum  created 01/25/16 [id number] by Vista Deck, CRNA   Modules edited: Charges VN

## 2016-01-25 NOTE — Progress Notes (Addendum)
Subjective:  Sad about having a tumor. Doesn't want to talk with me until she's had a bath. Mild abdominal pain.   Objective: Vital signs in last 24 hours: Temp:  [98.2 F (36.8 C)-98.5 F (36.9 C)] 98.4 F (36.9 C) (06/22 0417) Pulse Rate:  [67-97] 84 (06/22 0417) Resp:  [14-23] 20 (06/22 0417) BP: (96-113)/(55-73) 113/70 mmHg (06/22 0417) SpO2:  [96 %-100 %] 99 % (06/22 0417) Weight:  [153 lb (69.4 kg)] 153 lb (69.4 kg) (06/21 1200) Last BM Date: 01/24/16 General:   Alert,  Well-developed, well-nourished, pleasant and cooperative in NAD Head:  Normocephalic and atraumatic. Eyes:  Sclera clear, no icterus.  Abdomen:  Soft, nontender and nondistended.  Normal bowel sounds, without guarding, and without rebound.   Extremities:  Without clubbing, deformity or edema. Neurologic:  Alert and  oriented to person/place Skin:  Intact without significant lesions or rashes. Psych:  Alert and cooperative. Normal mood and affect.  Intake/Output from previous day: 06/21 0701 - 06/22 0700 In: 1140 [P.O.:240; I.V.:900] Out: 2901 [Urine:2901] Intake/Output this shift:    Lab Results: CBC  Recent Labs  01/22/16 1410 01/23/16 0428  WBC 9.2 7.5  HGB 8.7* 8.8*  HCT 27.6* 27.7*  MCV 70.6* 70.8*  PLT 308 349   BMET  Recent Labs  01/22/16 1410 01/23/16 0428  NA 138 144  K 3.6 4.0  CL 107 111  CO2 25 27  GLUCOSE 157* 97  BUN 12 8  CREATININE 0.87 0.76  CALCIUM 8.3* 8.3*   LFTs No results for input(s): BILITOT, BILIDIR, IBILI, ALKPHOS, AST, ALT, PROT, ALBUMIN in the last 72 hours. No results for input(s): LIPASE in the last 72 hours. PT/INR No results for input(s): LABPROT, INR in the last 72 hours.    Imaging Studies: No results found.[2 weeks]   Assessment: 60 y/o female with IDA. Admitted from a group home to facilitate bowel prep due to multiple failed colonoscopy attempts in the past (due to inadequate preps).   TCS 01/24/16: large fungating, exophytic tumor appears  to be arising out of the base of the cecum/appendix.    Plan: 1. CT A/P today. 2. Recommend surgical and oncology consultations while inpatient.  3. F/u on path. 4. Continue clear liquid diet.  Laureen Ochs. Bernarda Caffey [facility] [phone] 6/22/20179:08 AM

## 2016-01-25 NOTE — Addendum Note (Signed)
Addendum  created 01/25/16 1006 by Vista Deck, CRNA   Modules edited: Charges VN

## 2016-01-25 NOTE — Addendum Note (Signed)
Addendum  created 01/25/16 0753 by Mickel Baas, CRNA   Modules edited: Charges VN

## 2016-01-26 ENCOUNTER — Inpatient Hospital Stay (HOSPITAL_COMMUNITY): Payer: Medicaid Other

## 2016-01-26 DIAGNOSIS — K6389 Other specified diseases of intestine: Secondary | ICD-10-CM | POA: Insufficient documentation

## 2016-01-26 DIAGNOSIS — I517 Cardiomegaly: Secondary | ICD-10-CM

## 2016-01-26 LAB — ECHOCARDIOGRAM COMPLETE
CHL CUP STROKE VOLUME: 35 mL
E decel time: 324 msec
EERAT: 9.84
FS: 48 % — AB (ref 28–44)
HEIGHTINCHES: 64 in
IVS/LV PW RATIO, ED: 1.1
LA ID, A-P, ES: 33 mm
LA diam end sys: 33 mm
LA vol A4C: 27.6 ml
LA vol index: 18.4 mL/m2
LA vol: 32.8 mL
LADIAMINDEX: 1.85 cm/m2
LV PW d: 8.36 mm — AB (ref 0.6–1.1)
LV SIMPSON'S DISK: 68
LV TDI E'LATERAL: 8.92
LV TDI E'MEDIAL: 6.2
LV dias vol index: 29 mL/m2
LV dias vol: 52 mL (ref 46–106)
LV e' LATERAL: 8.92 cm/s
LVEEAVG: 9.84
LVEEMED: 9.84
LVOT area: 3.8 cm2
LVOTD: 22 mm
LVSYSVOL: 16 mL (ref 14–42)
LVSYSVOLIN: 9 mL/m2
MV Dec: 324
MV Peak grad: 3 mmHg
MV pk E vel: 87.8 m/s
MVPKAVEL: 101 m/s
RV LATERAL S' VELOCITY: 12.4 cm/s
RV TAPSE: 21.2 mm
WEIGHTICAEL: 2448 [oz_av]

## 2016-01-26 LAB — COMPREHENSIVE METABOLIC PANEL
ALT: 8 U/L — AB (ref 14–54)
AST: 12 U/L — AB (ref 15–41)
Albumin: 1.7 g/dL — ABNORMAL LOW (ref 3.5–5.0)
Alkaline Phosphatase: 119 U/L (ref 38–126)
Anion gap: 5 (ref 5–15)
BUN: 7 mg/dL (ref 6–20)
CHLORIDE: 111 mmol/L (ref 101–111)
CO2: 25 mmol/L (ref 22–32)
CREATININE: 0.67 mg/dL (ref 0.44–1.00)
Calcium: 7.8 mg/dL — ABNORMAL LOW (ref 8.9–10.3)
GFR calc Af Amer: >60 mL/min (ref >60)
Glucose, Bld: 123 mg/dL — ABNORMAL HIGH (ref 65–99)
POTASSIUM: 3.5 mmol/L (ref 3.5–5.1)
SODIUM: 141 mmol/L (ref 135–145)
Total Bilirubin: 0.2 mg/dL — ABNORMAL LOW (ref 0.3–1.2)
Total Protein: 5.2 g/dL — ABNORMAL LOW (ref 6.5–8.1)

## 2016-01-26 LAB — GLUCOSE, CAPILLARY
GLUCOSE-CAPILLARY: 118 mg/dL — AB (ref 65–99)
Glucose-Capillary: 149 mg/dL — ABNORMAL HIGH (ref 65–99)
Glucose-Capillary: 118 mg/dL — ABNORMAL HIGH (ref 65–99)
Glucose-Capillary: 126 mg/dL — ABNORMAL HIGH (ref 65–99)

## 2016-01-26 LAB — CEA: CEA: 38.8 ng/mL — AB (ref 0.0–4.7)

## 2016-01-26 LAB — CBC
HEMATOCRIT: 29.9 % — AB (ref 36.0–46.0)
HEMOGLOBIN: 9.3 g/dL — AB (ref 12.0–15.0)
MCH: 22.2 pg — ABNORMAL LOW (ref 26.0–34.0)
MCHC: 31.1 g/dL (ref 30.0–36.0)
MCV: 71.4 fL — AB (ref 78.0–100.0)
Platelets: 340 10*3/uL (ref 150–400)
RBC: 4.19 MIL/uL (ref 3.87–5.11)
RDW: 18.3 % — AB (ref 11.5–15.5)
WBC: 10.2 10*3/uL (ref 4.0–10.5)

## 2016-01-26 LAB — ABO/RH: ABO/RH(D): A POS

## 2016-01-26 LAB — PREPARE RBC (CROSSMATCH)

## 2016-01-26 MED ORDER — SODIUM CHLORIDE 0.9 % IV SOLN
510.0000 mg | Freq: Once | INTRAVENOUS | Status: AC
  Administered 2016-01-26: 510 mg via INTRAVENOUS
  Filled 2016-01-26: qty 17

## 2016-01-26 MED ORDER — IOPAMIDOL (ISOVUE-300) INJECTION 61%
75.0000 mL | Freq: Once | INTRAVENOUS | Status: AC | PRN
  Administered 2016-01-26: 75 mL via INTRAVENOUS

## 2016-01-26 NOTE — Progress Notes (Signed)
Dr. Avis Epley just called me to inform me biopsies revealed highly atypical cells suspicious but not diagnostic for carcinoma. Clinically, this is colon cancer. Agree with plans for surgical resection.

## 2016-01-26 NOTE — Progress Notes (Signed)
Awaiting echo report. Patient may continue heart healthy carb modified diet for now. CEA level noted to be elevated at 36.8.

## 2016-01-26 NOTE — Progress Notes (Signed)
I have reviewed the patient's labs.  I have read Gen Surg note.  Pathology is suspicious for adenocarcinoma.  Ferritin is less than 100 and iron studies are suspicious for anemia of chronic disease-like picture.  However, it would be in the patient's best interest to have her ferritin > 100, particularly in light of upcoming surgery and expected blood loss associated with that.  Order is placed for 1 dose of IV feraheme 510 mg.   Robynn Pane, PA-C 01/26/2016 4:47 PM

## 2016-01-26 NOTE — Progress Notes (Signed)
Subjective: Patient is resting. Patient had CT of he abdomen which confirmed that there is a  Large 9 cm colon mass and multiple lymphadenopathy .  Objective: Vital signs in last 24 hours: Temp:  [98.4 F (36.9 C)-98.7 F (37.1 C)] 98.5 F (36.9 C) (06/23 0537) Pulse Rate:  [60-78] 72 (06/23 0537) Resp:  [18-20] 20 (06/23 0537) BP: (93-101)/(52-65) 101/52 mmHg (06/23 0537) SpO2:  [98 %-100 %] 98 % (06/23 0537) Weight change:  Last BM Date: 01/25/16  Intake/Output from previous day: 06/22 0701 - 06/23 0700 In: 360 [P.O.:360] Out: 1900 [Urine:1900]  PHYSICAL EXAM General appearance: alert and no distress Resp: clear to auscultation bilaterally Cardio: S1, S2 normal GI: soft, non-tender; bowel sounds normal; no masses,  no organomegaly Extremities: extremities normal, atraumatic, no cyanosis or edema  Lab Results:  Results for orders placed or performed during the hospital encounter of 01/22/16 (from the past 48 hour(s))  Glucose, capillary     Status: Abnormal   Collection Time: 01/24/16 12:10 PM  Result Value Ref Range   Glucose-Capillary 100 (H) 65 - 99 mg/dL  Glucose, capillary     Status: Abnormal   Collection Time: 01/24/16  2:02 PM  Result Value Ref Range   Glucose-Capillary 117 (H) 65 - 99 mg/dL  Glucose, capillary     Status: Abnormal   Collection Time: 01/24/16  4:32 PM  Result Value Ref Range   Glucose-Capillary 135 (H) 65 - 99 mg/dL   Comment 1 Notify RN    Comment 2 Document in Chart   Glucose, capillary     Status: Abnormal   Collection Time: 01/24/16  8:48 PM  Result Value Ref Range   Glucose-Capillary 135 (H) 65 - 99 mg/dL   Comment 1 Notify RN    Comment 2 Document in Chart   Glucose, capillary     Status: Abnormal   Collection Time: 01/25/16  7:47 AM  Result Value Ref Range   Glucose-Capillary 101 (H) 65 - 99 mg/dL  Glucose, capillary     Status: None   Collection Time: 01/25/16 11:15 AM  Result Value Ref Range   Glucose-Capillary 99 65 - 99  mg/dL  Ferritin     Status: None   Collection Time: 01/25/16  4:31 PM  Result Value Ref Range   Ferritin 61 11 - 307 ng/mL    Comment: Performed at [facility]  Iron and TIBC     Status: Abnormal   Collection Time: 01/25/16  4:31 PM  Result Value Ref Range   Iron 14 (L) 28 - 170 ug/dL   TIBC 165 (L) 250 - 450 ug/dL   Saturation Ratios 8 (L) 10.4 - 31.8 %   UIBC 151 ug/dL    Comment: Performed at [facility]  ABO/Rh     Status: None   Collection Time: 01/26/16  5:35 AM  Result Value Ref Range   ABO/RH(D) A POS   Comprehensive metabolic panel     Status: Abnormal   Collection Time: 01/26/16  5:42 AM  Result Value Ref Range   Sodium 141 135 - 145 mmol/L   Potassium 3.5 3.5 - 5.1 mmol/L   Chloride 111 101 - 111 mmol/L   CO2 25 22 - 32 mmol/L   Glucose, Bld 123 (H) 65 - 99 mg/dL   BUN 7 6 - 20 mg/dL   Creatinine, Ser 0.67 0.44 - 1.00 mg/dL   Calcium 7.8 (L) 8.9 - 10.3 mg/dL   Total Protein 5.2 (L) 6.5 -  8.1 g/dL   Albumin 1.7 (L) 3.5 - 5.0 g/dL   AST 12 (L) 15 - 41 U/L   ALT 8 (L) 14 - 54 U/L   Alkaline Phosphatase 119 38 - 126 U/L   Total Bilirubin 0.2 (L) 0.3 - 1.2 mg/dL   GFR calc non Af Amer >60 >60 mL/min   GFR calc Af Amer >60 >60 mL/min    Comment: (NOTE) The eGFR has been calculated using the CKD EPI equation. This calculation has not been validated in all clinical situations. eGFR's persistently <60 mL/min signify possible Chronic Kidney Disease.    Anion gap 5 5 - 15  Prepare RBC     Status: None   Collection Time: 01/26/16  5:42 AM  Result Value Ref Range   Order Confirmation ORDER PROCESSED BY BLOOD BANK   CBC     Status: Abnormal   Collection Time: 01/26/16  5:42 AM  Result Value Ref Range   WBC 10.2 4.0 - 10.5 K/uL   RBC 4.19 3.87 - 5.11 MIL/uL   Hemoglobin 9.3 (L) 12.0 - 15.0 g/dL   HCT 29.9 (L) 36.0 - 46.0 %   MCV 71.4 (L) 78.0 - 100.0 fL   MCH 22.2 (L) 26.0 - 34.0 pg   MCHC 31.1 30.0 - 36.0 g/dL   RDW 18.3 (H) 11.5 - 15.5 %    Platelets 340 150 - 400 K/uL  Type and screen [facility]     Status: None (Preliminary result)   Collection Time: 01/26/16  5:42 AM  Result Value Ref Range   ABO/RH(D) A POS    Antibody Screen NEG    Sample Expiration 01/29/2016    Unit Number [id number]    Blood Component Type RBC LR PHER2    Unit division 00    Status of Unit ALLOCATED    Transfusion Status OK TO TRANSFUSE    Crossmatch Result Compatible    Unit Number [id number]    Blood Component Type RBC LR PHER2    Unit division 00    Status of Unit ALLOCATED    Transfusion Status OK TO TRANSFUSE    Crossmatch Result Compatible    Unit Number [id number]    Blood Component Type RED CELLS,LR    Unit division 00    Status of Unit ALLOCATED    Transfusion Status OK TO TRANSFUSE    Crossmatch Result Compatible    Unit Number [id number]    Blood Component Type RED CELLS,LR    Unit division 00    Status of Unit ALLOCATED    Transfusion Status OK TO TRANSFUSE    Crossmatch Result Compatible   Glucose, capillary     Status: Abnormal   Collection Time: 01/26/16  8:06 AM  Result Value Ref Range   Glucose-Capillary 118 (H) 65 - 99 mg/dL    ABGS No results for input(s): PHART, PO2ART, TCO2, HCO3 in the last 72 hours.  Invalid input(s): PCO2 CULTURES Recent Results (from the past 240 hour(s))  MRSA PCR Screening     Status: None   Collection Time: 01/22/16 12:42 PM  Result Value Ref Range Status   MRSA by PCR NEGATIVE NEGATIVE Final    Comment:        The GeneXpert MRSA Assay (FDA approved for NASAL specimens only), is one component of a comprehensive MRSA colonization surveillance program. It is not intended to diagnose MRSA infection nor to guide or monitor treatment for MRSA infections.    Studies/Results: Ct Abdomen Pelvis W  Contrast  01/25/2016  CLINICAL DATA:  Patient with history of large colonic mass. EXAM: CT ABDOMEN AND PELVIS WITH CONTRAST TECHNIQUE: Multidetector CT imaging of the  abdomen and pelvis was performed using the standard protocol following bolus administration of intravenous contrast. CONTRAST:  114m ISOVUE-300 IOPAMIDOL (ISOVUE-300) INJECTION 61% COMPARISON:  None. FINDINGS: Lower chest: Heart is enlarged. Trace pericardial effusion. Small bilateral pleural effusions. Lower lobe ground-glass consolidative opacities suggestive of atelectasis. Hepatobiliary: There is an 11 mm low-attenuation lesion within the inferior right hepatic lobe (image 40; series 2). There is an additional 6 mm low-attenuation lesion within the right hepatic lobe (image 22; series 2). There is a 7 mm low-attenuation lesion within the hepatic dome (image 7; series 2). Small amount of stones within the gallbladder lumen. Gallbladder is decompressed. Pancreas: Unremarkable Spleen: Unremarkable Adrenals/Urinary Tract: The adrenal glands are normal. Kidneys enhance symmetrically with contrast. Multiple bilateral sub cm low-attenuation renal lesions are demonstrated, too small to accurately characterize. 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 Urinary bladder is unremarkable. Stomach/Bowel: There is marked irregular thickening of the colon the level of the hepatic flexure (image 38; series 2). The mass measures up to 9 cm. There is a small amount of surrounding free fluid. Additionally there is free fluid throughout the upper abdomen and bilateral paracolic gutters. Vascular/Lymphatic: Normal caliber abdominal aorta. Aortic vascular calcifications. There is an 11 mm lymph node within the central mesentery (image 30; series 2). There is a 10 mm nodule adjacent to the hepatic flexure (image 57; series 3). There is a 13 mm soft tissue nodule within the right hemipelvis (image 80; series 2). Other: Uterus is unremarkable. Nonspecific radiodensity within the endometrial canal. Adnexal structures are unremarkable. Small amount of free fluid in the pelvis. Small fat containing left inguinal hernia.  Musculoskeletal: There is an 11 mm lucent lesion within the L2 vertebral body, nonspecific however favored to represent a small hemangioma. IMPRESSION: There is a large (9 cm) mass within the colon at the level of the hepatic flexure most compatible primary colonic carcinoma. There is surrounding free fluid within the upper abdomen. There are multiple small adjacent nodules as well as enlarged mesenteric lymph nodes concerning for metastatic disease. There is a nonspecific 13 mm nodule within the right hemipelvis which may represent a metastatic deposit. Alternatively, given the appearance, this may represent a small amount of loculated fluid. There are nonspecific low-attenuation lesions within the liver, the majority which are subcentimeter in size. Metastatic disease is not excluded. Small pericardial effusion and bilateral small pleural effusions. Aortic vascular calcifications. Electronically Signed   By: DLovey NewcomerM.D.   On: 01/25/2016 13:26    Medications: I have reviewed the patient's current medications.  Assesment:   Active Problems:   Diabetes mellitus (HCC)   Schizophrenia (HCC)   Iron deficiency anemia   HLD (hyperlipidemia)   GERD (gastroesophageal reflux disease)   Colon tumor      Plan:  Medications reviewed Will follow biopsy result Surgery consult appreciated Oncology consult appreciated   LOS: 1 day   Mary Wells 01/26/2016, 8:09 AM

## 2016-01-26 NOTE — Clinical Social Work Note (Signed)
CSW met with pt at bedside. Discussed her understanding of findings and pt shared that she is still trying to process it all. Support provided. Pt requested that CSW notify her facility of everything and plan for surgery on Monday. Facility has been visiting and this has helped pt. CSW updated Mernella, administrator who plans to see pt soon.    , LCSW [phone] 

## 2016-01-26 NOTE — Progress Notes (Signed)
REVIEWED-NO ADDITIONAL RECOMMENDATIONS. 1014   Subjective: Pleasant today. Denies abdominal pain, N/V. Is happy they're letting have regular food. Denies any upper or lower abdominal symptoms. Is a little anxious about surgery.  Objective: Vital signs in last 24 hours: Temp:  [98.4 F (36.9 C)-98.7 F (37.1 C)] 98.5 F (36.9 C) (06/23 0537) Pulse Rate:  [60-78] 72 (06/23 0537) Resp:  [18-20] 20 (06/23 0537) BP: (93-101)/(52-65) 101/52 mmHg (06/23 0537) SpO2:  [98 %-100 %] 98 % (06/23 0537) Last BM Date: 01/25/16 General:   Alert and oriented, pleasant Head:  Normocephalic and atraumatic. Eyes:  No icterus, sclera clear. Conjuctiva pink.  Heart:  S1, S2 present, no murmurs noted.  Lungs: Clear to auscultation bilaterally, without wheezing, rales, or rhonchi.  Abdomen:  Bowel sounds present, soft, non-tender, non-distended. No HSM or hernias noted. No rebound or guarding. No masses appreciated  Pulses:  Normal bilateral DP pulses noted. Extremities:  Without clubbing or edema. Neurologic:  Alert and  oriented x4;  grossly normal neurologically. Psych:  Alert and cooperative. Normal mood and affect.  Intake/Output from previous day: 06/22 0701 - 06/23 0700 In: 360 [P.O.:360] Out: 1900 [Urine:1900] Intake/Output this shift:    Lab Results:  Recent Labs  01/26/16 0542  WBC 10.2  HGB 9.3*  HCT 29.9*  PLT 340   BMET  Recent Labs  01/26/16 0542  NA 141  K 3.5  CL 111  CO2 25  GLUCOSE 123*  BUN 7  CREATININE 0.67  CALCIUM 7.8*   LFT  Recent Labs  01/26/16 0542  PROT 5.2*  ALBUMIN 1.7*  AST 12*  ALT 8*  ALKPHOS 119  BILITOT 0.2*   PT/INR No results for input(s): LABPROT, INR in the last 72 hours. Hepatitis Panel No results for input(s): HEPBSAG, HCVAB, HEPAIGM, HEPBIGM in the last 72 hours.   Studies/Results: Dg Chest 2 View  01/26/2016  CLINICAL DATA:  Colon cancer EXAM: CHEST  2 VIEW COMPARISON:  None. FINDINGS: 11 mm right upper lobe pulmonary  nodule is compatible with the lesions seen on CT scan earlier today. No focal airspace consolidation or pulmonary edema. No pleural effusion. Cardiopericardial silhouette is at upper limits of normal for size. Atelectasis or scarring noted at the left lung base. The visualized bony structures of the thorax are intact. IMPRESSION: Right upper lobe pulmonary nodule. Electronically Signed   By: Misty Stanley M.D.   On: 01/26/2016 08:21   Ct Chest W Contrast  01/26/2016  CLINICAL DATA:  Newly diagnosed colon carcinoma.  Staging. EXAM: CT CHEST WITH CONTRAST TECHNIQUE: Multidetector CT imaging of the chest was performed during intravenous contrast administration. CONTRAST:  69mL ISOVUE-300 IOPAMIDOL (ISOVUE-300) INJECTION 61% COMPARISON:  None. FINDINGS: Mediastinum/Lymph Nodes: No masses, pathologically enlarged lymph nodes, or other acute abnormality. Aortic atherosclerosis noted. Coronary artery calcification also noted. Mild cardiomegaly demonstrated. Lungs/Pleura: tiny bilateral pleural effusions are noted with mild bibasilar atelectasis or scarring. A 9 mm mean diameter pulmonary nodule seen in the posterior right upper lobe on image 42/series 3 which is noncalcified and smoothly marginated. No other pulmonary nodules or masses are identified. Upper abdomen: See separate abdomen pelvis CT report from 01/25/2016. Musculoskeletal: No chest wall mass or suspicious bone lesions identified. IMPRESSION: 9 mm pulmonary nodule in posterior right upper lobe. Solitary pulmonary metastasis or bronchogenic carcinoma cannot be excluded. Consider PET-CT scan for further evaluation. No lymphadenopathy or other sites of metastatic disease identified within the thorax. Tiny bilateral pleural effusions and mild bibasilar atelectasis or scarring. Aortic atherosclerosis  noted.  Coronary artery calcification noted. Electronically Signed   By: Earle Gell M.D.   On: 01/26/2016 08:21   Ct Abdomen Pelvis W Contrast  01/25/2016   CLINICAL DATA:  Patient with history of large colonic mass. EXAM: CT ABDOMEN AND PELVIS WITH CONTRAST TECHNIQUE: Multidetector CT imaging of the abdomen and pelvis was performed using the standard protocol following bolus administration of intravenous contrast. CONTRAST:  133mL ISOVUE-300 IOPAMIDOL (ISOVUE-300) INJECTION 61% COMPARISON:  None. FINDINGS: Lower chest: Heart is enlarged. Trace pericardial effusion. Small bilateral pleural effusions. Lower lobe ground-glass consolidative opacities suggestive of atelectasis. Hepatobiliary: There is an 11 mm low-attenuation lesion within the inferior right hepatic lobe (image 40; series 2). There is an additional 6 mm low-attenuation lesion within the right hepatic lobe (image 22; series 2). There is a 7 mm low-attenuation lesion within the hepatic dome (image 7; series 2). Small amount of stones within the gallbladder lumen. Gallbladder is decompressed. Pancreas: Unremarkable Spleen: Unremarkable Adrenals/Urinary Tract: The adrenal glands are normal. Kidneys enhance symmetrically with contrast. Multiple bilateral sub cm low-attenuation renal lesions are demonstrated, too small to accurately characterize. 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 Urinary bladder is unremarkable. Stomach/Bowel: There is marked irregular thickening of the colon the level of the hepatic flexure (image 38; series 2). The mass measures up to 9 cm. There is a small amount of surrounding free fluid. Additionally there is free fluid throughout the upper abdomen and bilateral paracolic gutters. Vascular/Lymphatic: Normal caliber abdominal aorta. Aortic vascular calcifications. There is an 11 mm lymph node within the central mesentery (image 30; series 2). There is a 10 mm nodule adjacent to the hepatic flexure (image 57; series 3). There is a 13 mm soft tissue nodule within the right hemipelvis (image 80; series 2). Other: Uterus is unremarkable. Nonspecific radiodensity within the  endometrial canal. Adnexal structures are unremarkable. Small amount of free fluid in the pelvis. Small fat containing left inguinal hernia. Musculoskeletal: There is an 11 mm lucent lesion within the L2 vertebral body, nonspecific however favored to represent a small hemangioma. IMPRESSION: There is a large (9 cm) mass within the colon at the level of the hepatic flexure most compatible primary colonic carcinoma. There is surrounding free fluid within the upper abdomen. There are multiple small adjacent nodules as well as enlarged mesenteric lymph nodes concerning for metastatic disease. There is a nonspecific 13 mm nodule within the right hemipelvis which may represent a metastatic deposit. Alternatively, given the appearance, this may represent a small amount of loculated fluid. There are nonspecific low-attenuation lesions within the liver, the majority which are subcentimeter in size. Metastatic disease is not excluded. Small pericardial effusion and bilateral small pleural effusions. Aortic vascular calcifications. Electronically Signed   By: Lovey Newcomer M.D.   On: 01/25/2016 13:26    Assessment: 60 y/o female with IDA. Admitted from a group home to facilitate bowel prep due to multiple failed colonoscopy attempts in the past (due to inadequate preps).   TCS 01/24/16: large fungating, exophytic tumor appears to be arising out of the base of the cecum/appendix. Pathology pending.  CT abdomen/pelvis: large hepatic flexure mass compatible with primary carcinoma with multiple adjacent small nodules and enlarged mesenteric lymph nodes concerning for metastatic disease; nonspecific 13 mm nodule in the right hemipelvis possibly metastatic deposit vs small amount of loculated fluid; non-specific low-attenuation lesions within the liver mostly sub-centemeter in size and unable to exclude metastatic disease.   Has been seen by oncology, CT chest ordered for  staging which found 9 mm pulmonary nodule right upper  lobe cannot exclude solitary pulmonary mets or bronchogenic carcinoma, consider PET. CEA pending  Has been seen by surgery and will need partial colectomy and port-a-cath placement, pre-op transfusion. Anticipate surgery 01/29/16.  Is doing well today, tolerating diet, no abdominal pain or other overt upper or lower GI symptoms. Anxious about surgery, has asked me to pray for her. Seems to be in good spirits, smiles a lot.   Plan: 1. Continue supportive measures 2. Follow for colon mass pathology 3. Will follow peripherally at this point.    Walden Field, AGNP-C Adult & Gerontological Nurse Practitioner [facility]    LOS: 1 day    01/26/2016, 8:43 AM

## 2016-01-27 LAB — URINALYSIS, ROUTINE W REFLEX MICROSCOPIC
BILIRUBIN URINE: NEGATIVE
Glucose, UA: NEGATIVE mg/dL
KETONES UR: NEGATIVE mg/dL
NITRITE: NEGATIVE
Protein, ur: NEGATIVE mg/dL
Specific Gravity, Urine: <1.005 — ABNORMAL LOW (ref 1.005–1.030)
pH: 7 (ref 5.0–8.0)

## 2016-01-27 LAB — URINE MICROSCOPIC-ADD ON

## 2016-01-27 LAB — GLUCOSE, CAPILLARY
GLUCOSE-CAPILLARY: 144 mg/dL — AB (ref 65–99)
Glucose-Capillary: 83 mg/dL (ref 65–99)
Glucose-Capillary: 106 mg/dL — ABNORMAL HIGH (ref 65–99)
Glucose-Capillary: 105 mg/dL — ABNORMAL HIGH (ref 65–99)

## 2016-01-27 LAB — CEA: CEA: 33.3 ng/mL — ABNORMAL HIGH (ref 0.0–4.7)

## 2016-01-27 LAB — SURGICAL PCR SCREEN
MRSA, PCR: NEGATIVE
Staphylococcus aureus: POSITIVE — AB

## 2016-01-27 MED ORDER — CHLORHEXIDINE GLUCONATE CLOTH 2 % EX PADS
6.0000 | MEDICATED_PAD | Freq: Every day | CUTANEOUS | Status: DC
  Administered 2016-01-28 – 2016-01-29 (×2): 6 via TOPICAL

## 2016-01-27 MED ORDER — MUPIROCIN 2 % EX OINT
1.0000 "application " | TOPICAL_OINTMENT | Freq: Two times a day (BID) | CUTANEOUS | Status: DC
  Administered 2016-01-27 – 2016-01-29 (×4): 1 via NASAL
  Filled 2016-01-27: qty 22

## 2016-01-27 NOTE — Progress Notes (Signed)
1848 Surgical PCR results received MRSA - negative, Staphylococcus aureus - positive.

## 2016-01-27 NOTE — Progress Notes (Signed)
Subjective: Patient is scheduled for surgery on Monday and shee is resting. Biopsy result suspicious for carcinoma. Objective Vital signs in last 24 hours: Temp:  [97.5 F (36.4 C)-98.9 F (37.2 C)] 97.5 F (36.4 C) (06/24 0555) Pulse Rate:  [52-70] 52 (06/24 0555) Resp:  [20] 20 (06/24 0555) BP: (112-118)/(55-63) 112/55 mmHg (06/24 0555) SpO2:  [96 %-100 %] 99 % (06/24 0555) Weight change:  Last BM Date: 01/25/16  Intake/Output from previous day: 06/23 0701 - 06/24 0700 In: 5621.2 [P.O.:720; I.V.:4784.2; IV Piggyback:117] Out: 3800 [Urine:3800]  PHYSICAL EXAM General appearance: alert and no distress Resp: clear to auscultation bilaterally Cardio: S1, S2 normal GI: soft, non-tender; bowel sounds normal; no masses,  no organomegaly Extremities: extremities normal, atraumatic, no cyanosis or edema  Lab Results:  Results for orders placed or performed during the hospital encounter of 01/22/16 (from the past 48 hour(s))  Glucose, capillary     Status: None   Collection Time: 01/25/16 11:15 AM  Result Value Ref Range   Glucose-Capillary 99 65 - 99 mg/dL  CEA     Status: Abnormal   Collection Time: 01/25/16  4:31 PM  Result Value Ref Range   CEA 38.8 (H) 0.0 - 4.7 ng/mL    Comment: (NOTE)       Roche ECLIA methodology       Nonsmokers  <3.9                                     Smokers     <5.6 Performed At: [facility] [place], [place] [id number] Lindon Romp MD ZS:[id number]   Ferritin     Status: None   Collection Time: 01/25/16  4:31 PM  Result Value Ref Range   Ferritin 61 11 - 307 ng/mL    Comment: Performed at [facility]  Iron and TIBC     Status: Abnormal   Collection Time: 01/25/16  4:31 PM  Result Value Ref Range   Iron 14 (L) 28 - 170 ug/dL   TIBC 165 (L) 250 - 450 ug/dL   Saturation Ratios 8 (L) 10.4 - 31.8 %   UIBC 151 ug/dL    Comment: Performed at [facility]  ABO/Rh     Status: None   Collection Time:  01/26/16  5:35 AM  Result Value Ref Range   ABO/RH(D) A POS   CEA     Status: Abnormal   Collection Time: 01/26/16  5:42 AM  Result Value Ref Range   CEA 33.3 (H) 0.0 - 4.7 ng/mL    Comment: (NOTE)       Roche ECLIA methodology       Nonsmokers  <3.9                                     Smokers     <5.6 Performed At: [facility] [place], [place] [id number] Lindon Romp MD [id number]   Comprehensive metabolic panel     Status: Abnormal   Collection Time: 01/26/16  5:42 AM  Result Value Ref Range   Sodium 141 135 - 145 mmol/L   Potassium 3.5 3.5 - 5.1 mmol/L   Chloride 111 101 - 111 mmol/L   CO2 25 22 - 32 mmol/L   Glucose, Bld 123 (H) 65 - 99 mg/dL  BUN 7 6 - 20 mg/dL   Creatinine, Ser 0.67 0.44 - 1.00 mg/dL   Calcium 7.8 (L) 8.9 - 10.3 mg/dL   Total Protein 5.2 (L) 6.5 - 8.1 g/dL   Albumin 1.7 (L) 3.5 - 5.0 g/dL   AST 12 (L) 15 - 41 U/L   ALT 8 (L) 14 - 54 U/L   Alkaline Phosphatase 119 38 - 126 U/L   Total Bilirubin 0.2 (L) 0.3 - 1.2 mg/dL   GFR calc non Af Amer >60 >60 mL/min   GFR calc Af Amer >60 >60 mL/min    Comment: (NOTE) The eGFR has been calculated using the CKD EPI equation. This calculation has not been validated in all clinical situations. eGFR's persistently <60 mL/min signify possible Chronic Kidney Disease.    Anion gap 5 5 - 15  Prepare RBC     Status: None   Collection Time: 01/26/16  5:42 AM  Result Value Ref Range   Order Confirmation ORDER PROCESSED BY BLOOD BANK   CBC     Status: Abnormal   Collection Time: 01/26/16  5:42 AM  Result Value Ref Range   WBC 10.2 4.0 - 10.5 K/uL   RBC 4.19 3.87 - 5.11 MIL/uL   Hemoglobin 9.3 (L) 12.0 - 15.0 g/dL   HCT 29.9 (L) 36.0 - 46.0 %   MCV 71.4 (L) 78.0 - 100.0 fL   MCH 22.2 (L) 26.0 - 34.0 pg   MCHC 31.1 30.0 - 36.0 g/dL   RDW 18.3 (H) 11.5 - 15.5 %   Platelets 340 150 - 400 K/uL  Type and screen [facility]     Status: None (Preliminary result)   Collection  Time: 01/26/16  5:42 AM  Result Value Ref Range   ABO/RH(D) A POS    Antibody Screen NEG    Sample Expiration 01/29/2016    Unit Number [id number]    Blood Component Type RBC LR PHER2    Unit division 00    Status of Unit ALLOCATED    Transfusion Status OK TO TRANSFUSE    Crossmatch Result Compatible    Unit Number [id number]    Blood Component Type RBC LR PHER2    Unit division 00    Status of Unit ALLOCATED    Transfusion Status OK TO TRANSFUSE    Crossmatch Result Compatible    Unit Number [id number]    Blood Component Type RED CELLS,LR    Unit division 00    Status of Unit ALLOCATED    Transfusion Status OK TO TRANSFUSE    Crossmatch Result Compatible    Unit Number [id number]    Blood Component Type RED CELLS,LR    Unit division 00    Status of Unit ALLOCATED    Transfusion Status OK TO TRANSFUSE    Crossmatch Result Compatible   Glucose, capillary     Status: Abnormal   Collection Time: 01/26/16  8:06 AM  Result Value Ref Range   Glucose-Capillary 118 (H) 65 - 99 mg/dL  Glucose, capillary     Status: Abnormal   Collection Time: 01/26/16 11:27 AM  Result Value Ref Range   Glucose-Capillary 149 (H) 65 - 99 mg/dL  Glucose, capillary     Status: Abnormal   Collection Time: 01/26/16  4:26 PM  Result Value Ref Range   Glucose-Capillary 118 (H) 65 - 99 mg/dL  Glucose, capillary     Status: Abnormal   Collection Time: 01/26/16  9:19 PM  Result Value Ref Range  Glucose-Capillary 126 (H) 65 - 99 mg/dL   Comment 1 Notify RN    Comment 2 Document in Chart   Urinalysis, Routine w reflex microscopic (not at [facility])     Status: Abnormal   Collection Time: 01/27/16  5:56 AM  Result Value Ref Range   Color, Urine YELLOW YELLOW   APPearance HAZY (A) CLEAR   Specific Gravity, Urine <1.005 (L) 1.005 - 1.030   pH 7.0 5.0 - 8.0   Glucose, UA NEGATIVE NEGATIVE mg/dL   Hgb urine dipstick TRACE (A) NEGATIVE   Bilirubin Urine NEGATIVE NEGATIVE   Ketones, ur NEGATIVE  NEGATIVE mg/dL   Protein, ur NEGATIVE NEGATIVE mg/dL   Nitrite NEGATIVE NEGATIVE   Leukocytes, UA LARGE (A) NEGATIVE  Urine microscopic-add on     Status: Abnormal   Collection Time: 01/27/16  5:56 AM  Result Value Ref Range   Squamous Epithelial / LPF 0-5 (A) NONE SEEN   WBC, UA 0-5 0 - 5 WBC/hpf   RBC / HPF 0-5 0 - 5 RBC/hpf   Bacteria, UA MANY (A) NONE SEEN  Glucose, capillary     Status: None   Collection Time: 01/27/16  7:26 AM  Result Value Ref Range   Glucose-Capillary 83 65 - 99 mg/dL    ABGS No results for input(s): PHART, PO2ART, TCO2, HCO3 in the last 72 hours.  Invalid input(s): PCO2 CULTURES Recent Results (from the past 240 hour(s))  MRSA PCR Screening     Status: None   Collection Time: 01/22/16 12:42 PM  Result Value Ref Range Status   MRSA by PCR NEGATIVE NEGATIVE Final    Comment:        The GeneXpert MRSA Assay (FDA approved for NASAL specimens only), is one component of a comprehensive MRSA colonization surveillance program. It is not intended to diagnose MRSA infection nor to guide or monitor treatment for MRSA infections.    Studies/Results: Dg Chest 2 View  01/26/2016  CLINICAL DATA:  Colon cancer EXAM: CHEST  2 VIEW COMPARISON:  None. FINDINGS: 11 mm right upper lobe pulmonary nodule is compatible with the lesions seen on CT scan earlier today. No focal airspace consolidation or pulmonary edema. No pleural effusion. Cardiopericardial silhouette is at upper limits of normal for size. Atelectasis or scarring noted at the left lung base. The visualized bony structures of the thorax are intact. IMPRESSION: Right upper lobe pulmonary nodule. Electronically Signed   By: Misty Stanley M.D.   On: 01/26/2016 08:21   Ct Chest W Contrast  01/26/2016  CLINICAL DATA:  Newly diagnosed colon carcinoma.  Staging. EXAM: CT CHEST WITH CONTRAST TECHNIQUE: Multidetector CT imaging of the chest was performed during intravenous contrast administration. CONTRAST:  59m  ISOVUE-300 IOPAMIDOL (ISOVUE-300) INJECTION 61% COMPARISON:  None. FINDINGS: Mediastinum/Lymph Nodes: No masses, pathologically enlarged lymph nodes, or other acute abnormality. Aortic atherosclerosis noted. Coronary artery calcification also noted. Mild cardiomegaly demonstrated. Lungs/Pleura: tiny bilateral pleural effusions are noted with mild bibasilar atelectasis or scarring. A 9 mm mean diameter pulmonary nodule seen in the posterior right upper lobe on image 42/series 3 which is noncalcified and smoothly marginated. No other pulmonary nodules or masses are identified. Upper abdomen: See separate abdomen pelvis CT report from 01/25/2016. Musculoskeletal: No chest wall mass or suspicious bone lesions identified. IMPRESSION: 9 mm pulmonary nodule in posterior right upper lobe. Solitary pulmonary metastasis or bronchogenic carcinoma cannot be excluded. Consider PET-CT scan for further evaluation. No lymphadenopathy or other sites of metastatic disease identified within the thorax.  Tiny bilateral pleural effusions and mild bibasilar atelectasis or scarring. Aortic atherosclerosis noted.  Coronary artery calcification noted. Electronically Signed   By: Earle Gell M.D.   On: 01/26/2016 08:21   Ct Abdomen Pelvis W Contrast  01/25/2016  CLINICAL DATA:  Patient with history of large colonic mass. EXAM: CT ABDOMEN AND PELVIS WITH CONTRAST TECHNIQUE: Multidetector CT imaging of the abdomen and pelvis was performed using the standard protocol following bolus administration of intravenous contrast. CONTRAST:  114m ISOVUE-300 IOPAMIDOL (ISOVUE-300) INJECTION 61% COMPARISON:  None. FINDINGS: Lower chest: Heart is enlarged. Trace pericardial effusion. Small bilateral pleural effusions. Lower lobe ground-glass consolidative opacities suggestive of atelectasis. Hepatobiliary: There is an 11 mm low-attenuation lesion within the inferior right hepatic lobe (image 40; series 2). There is an additional 6 mm low-attenuation  lesion within the right hepatic lobe (image 22; series 2). There is a 7 mm low-attenuation lesion within the hepatic dome (image 7; series 2). Small amount of stones within the gallbladder lumen. Gallbladder is decompressed. Pancreas: Unremarkable Spleen: Unremarkable Adrenals/Urinary Tract: The adrenal glands are normal. Kidneys enhance symmetrically with contrast. Multiple bilateral sub cm low-attenuation renal lesions are demonstrated, too small to accurately characterize. 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 Urinary bladder is unremarkable. Stomach/Bowel: There is marked irregular thickening of the colon the level of the hepatic flexure (image 38; series 2). The mass measures up to 9 cm. There is a small amount of surrounding free fluid. Additionally there is free fluid throughout the upper abdomen and bilateral paracolic gutters. Vascular/Lymphatic: Normal caliber abdominal aorta. Aortic vascular calcifications. There is an 11 mm lymph node within the central mesentery (image 30; series 2). There is a 10 mm nodule adjacent to the hepatic flexure (image 57; series 3). There is a 13 mm soft tissue nodule within the right hemipelvis (image 80; series 2). Other: Uterus is unremarkable. Nonspecific radiodensity within the endometrial canal. Adnexal structures are unremarkable. Small amount of free fluid in the pelvis. Small fat containing left inguinal hernia. Musculoskeletal: There is an 11 mm lucent lesion within the L2 vertebral body, nonspecific however favored to represent a small hemangioma. IMPRESSION: There is a large (9 cm) mass within the colon at the level of the hepatic flexure most compatible primary colonic carcinoma. There is surrounding free fluid within the upper abdomen. There are multiple small adjacent nodules as well as enlarged mesenteric lymph nodes concerning for metastatic disease. There is a nonspecific 13 mm nodule within the right hemipelvis which may represent a  metastatic deposit. Alternatively, given the appearance, this may represent a small amount of loculated fluid. There are nonspecific low-attenuation lesions within the liver, the majority which are subcentimeter in size. Metastatic disease is not excluded. Small pericardial effusion and bilateral small pleural effusions. Aortic vascular calcifications. Electronically Signed   By: DLovey NewcomerM.D.   On: 01/25/2016 13:26    Medications: I have reviewed the patient's current medications.  Assesment:   Active Problems:   Diabetes mellitus (HCC)   Schizophrenia (HCC)   Iron deficiency anemia   HLD (hyperlipidemia)   GERD (gastroesophageal reflux disease)   Colon tumor   Colonic mass      Plan:  Medications reviewed As perr surgery plan  LOS: 2 days   Mary Wells 01/27/2016, 8:33 AM

## 2016-01-27 NOTE — Progress Notes (Signed)
Echo results show normal ejection fraction with trace mitral regurgitation. Will start clear liquid diet tomorrow. Will give bowel prep. Will check labs in a.m. Path report suspicious for adenocarcinoma.

## 2016-01-28 LAB — BASIC METABOLIC PANEL
Anion gap: 2 — ABNORMAL LOW (ref 5–15)
BUN: 9 mg/dL (ref 6–20)
CHLORIDE: 113 mmol/L — AB (ref 101–111)
CO2: 26 mmol/L (ref 22–32)
CREATININE: 0.62 mg/dL (ref 0.44–1.00)
Calcium: 8.2 mg/dL — ABNORMAL LOW (ref 8.9–10.3)
GFR calc non Af Amer: >60 mL/min (ref >60)
Glucose, Bld: 102 mg/dL — ABNORMAL HIGH (ref 65–99)
Potassium: 3.7 mmol/L (ref 3.5–5.1)
SODIUM: 141 mmol/L (ref 135–145)

## 2016-01-28 LAB — CBC
HCT: 28 % — ABNORMAL LOW (ref 36.0–46.0)
HEMOGLOBIN: 8.7 g/dL — AB (ref 12.0–15.0)
MCH: 22 pg — ABNORMAL LOW (ref 26.0–34.0)
MCHC: 31.1 g/dL (ref 30.0–36.0)
MCV: 70.9 fL — ABNORMAL LOW (ref 78.0–100.0)
Platelets: 322 10*3/uL (ref 150–400)
RBC: 3.95 MIL/uL (ref 3.87–5.11)
RDW: 18.3 % — ABNORMAL HIGH (ref 11.5–15.5)
WBC: 8.2 10*3/uL (ref 4.0–10.5)

## 2016-01-28 LAB — GLUCOSE, CAPILLARY
GLUCOSE-CAPILLARY: 113 mg/dL — AB (ref 65–99)
GLUCOSE-CAPILLARY: 97 mg/dL (ref 65–99)
GLUCOSE-CAPILLARY: 81 mg/dL (ref 65–99)

## 2016-01-28 MED ORDER — SODIUM CHLORIDE 0.9 % IV SOLN
Freq: Once | INTRAVENOUS | Status: AC
Start: 2016-01-28 — End: 2016-01-28
  Administered 2016-01-28: 15:00:00 via INTRAVENOUS

## 2016-01-28 MED ORDER — PEG 3350-KCL-NA BICARB-NACL 420 G PO SOLR
4000.0000 mL | Freq: Once | ORAL | Status: AC
  Administered 2016-01-28: 4000 mL via ORAL

## 2016-01-28 MED ORDER — PEG 3350-KCL-NA BICARB-NACL 420 G PO SOLR
ORAL | Status: AC
  Filled 2016-01-28: qty 4000

## 2016-01-28 NOTE — Progress Notes (Signed)
Subjective: Patient is resting. She is planned for surgery tomorrow. No new complaint.. Objective Vital signs in last 24 hours: Temp:  [98 F (36.7 C)-98.5 F (36.9 C)] 98.5 F (36.9 C) (06/25 0529) Pulse Rate:  [57-78] 57 (06/25 0529) Resp:  [18-20] 20 (06/25 0529) BP: (93-124)/(45-58) 93/45 mmHg (06/25 0529) SpO2:  [95 %-99 %] 98 % (06/25 0529) Weight change:  Last BM Date: 01/25/16  Intake/Output from previous day: 06/24 0701 - 06/25 0700 In: 720 [P.O.:720] Out: 4200 [Urine:4200]  PHYSICAL EXAM General appearance: alert and no distress Resp: clear to auscultation bilaterally Cardio: S1, S2 normal GI: soft, non-tender; bowel sounds normal; no masses,  no organomegaly Extremities: extremities normal, atraumatic, no cyanosis or edema  Lab Results:  Results for orders placed or performed during the hospital encounter of 01/22/16 (from the past 48 hour(s))  Glucose, capillary     Status: Abnormal   Collection Time: 01/26/16  8:06 AM  Result Value Ref Range   Glucose-Capillary 118 (H) 65 - 99 mg/dL  Glucose, capillary     Status: Abnormal   Collection Time: 01/26/16 11:27 AM  Result Value Ref Range   Glucose-Capillary 149 (H) 65 - 99 mg/dL  Glucose, capillary     Status: Abnormal   Collection Time: 01/26/16  4:26 PM  Result Value Ref Range   Glucose-Capillary 118 (H) 65 - 99 mg/dL  Glucose, capillary     Status: Abnormal   Collection Time: 01/26/16  9:19 PM  Result Value Ref Range   Glucose-Capillary 126 (H) 65 - 99 mg/dL   Comment 1 Notify RN    Comment 2 Document in Chart   Urinalysis, Routine w reflex microscopic (not at [facility])     Status: Abnormal   Collection Time: 01/27/16  5:56 AM  Result Value Ref Range   Color, Urine YELLOW YELLOW   APPearance HAZY (A) CLEAR   Specific Gravity, Urine <1.005 (L) 1.005 - 1.030   pH 7.0 5.0 - 8.0   Glucose, UA NEGATIVE NEGATIVE mg/dL   Hgb urine dipstick TRACE (A) NEGATIVE   Bilirubin Urine NEGATIVE NEGATIVE   Ketones, ur  NEGATIVE NEGATIVE mg/dL   Protein, ur NEGATIVE NEGATIVE mg/dL   Nitrite NEGATIVE NEGATIVE   Leukocytes, UA LARGE (A) NEGATIVE  Urine microscopic-add on     Status: Abnormal   Collection Time: 01/27/16  5:56 AM  Result Value Ref Range   Squamous Epithelial / LPF 0-5 (A) NONE SEEN   WBC, UA 0-5 0 - 5 WBC/hpf   RBC / HPF 0-5 0 - 5 RBC/hpf   Bacteria, UA MANY (A) NONE SEEN  Glucose, capillary     Status: None   Collection Time: 01/27/16  7:26 AM  Result Value Ref Range   Glucose-Capillary 83 65 - 99 mg/dL  Glucose, capillary     Status: Abnormal   Collection Time: 01/27/16 11:28 AM  Result Value Ref Range   Glucose-Capillary 106 (H) 65 - 99 mg/dL  Surgical PCR screen     Status: Abnormal   Collection Time: 01/27/16 11:50 AM  Result Value Ref Range   MRSA, PCR NEGATIVE NEGATIVE   Staphylococcus aureus POSITIVE (A) NEGATIVE    Comment:        The Xpert SA Assay (FDA approved for NASAL specimens in patients over 76 years of age), is one component of a comprehensive surveillance program.  Test performance has been validated by [facility] for patients greater than or equal to 71 year old. It is not intended  to diagnose infection nor to guide or monitor treatment. RESULT CALLED TO, READ BACK BY AND VERIFIED WITH:  MCGIBBONY,C @ 1847 ON 01/27/16 BY WOODIE,J   Glucose, capillary     Status: Abnormal   Collection Time: 01/27/16  4:20 PM  Result Value Ref Range   Glucose-Capillary 105 (H) 65 - 99 mg/dL  Glucose, capillary     Status: Abnormal   Collection Time: 01/27/16  8:42 PM  Result Value Ref Range   Glucose-Capillary 144 (H) 65 - 99 mg/dL   Comment 1 Notify RN    Comment 2 Document in Chart   CBC     Status: Abnormal   Collection Time: 01/28/16  5:59 AM  Result Value Ref Range   WBC 8.2 4.0 - 10.5 K/uL   RBC 3.95 3.87 - 5.11 MIL/uL   Hemoglobin 8.7 (L) 12.0 - 15.0 g/dL   HCT 28.0 (L) 36.0 - 46.0 %   MCV 70.9 (L) 78.0 - 100.0 fL   MCH 22.0 (L) 26.0 - 34.0 pg   MCHC  31.1 30.0 - 36.0 g/dL   RDW 18.3 (H) 11.5 - 15.5 %   Platelets 322 150 - 400 K/uL  Basic metabolic panel     Status: Abnormal   Collection Time: 01/28/16  5:59 AM  Result Value Ref Range   Sodium 141 135 - 145 mmol/L   Potassium 3.7 3.5 - 5.1 mmol/L   Chloride 113 (H) 101 - 111 mmol/L   CO2 26 22 - 32 mmol/L   Glucose, Bld 102 (H) 65 - 99 mg/dL   BUN 9 6 - 20 mg/dL   Creatinine, Ser 0.62 0.44 - 1.00 mg/dL   Calcium 8.2 (L) 8.9 - 10.3 mg/dL   GFR calc non Af Amer >60 >60 mL/min   GFR calc Af Amer >60 >60 mL/min    Comment: (NOTE) The eGFR has been calculated using the CKD EPI equation. This calculation has not been validated in all clinical situations. eGFR's persistently <60 mL/min signify possible Chronic Kidney Disease.    Anion gap 2 (L) 5 - 15    Comment: REPEATED TO VERIFY    ABGS No results for input(s): PHART, PO2ART, TCO2, HCO3 in the last 72 hours.  Invalid input(s): PCO2 CULTURES Recent Results (from the past 240 hour(s))  MRSA PCR Screening     Status: None   Collection Time: 01/22/16 12:42 PM  Result Value Ref Range Status   MRSA by PCR NEGATIVE NEGATIVE Final    Comment:        The GeneXpert MRSA Assay (FDA approved for NASAL specimens only), is one component of a comprehensive MRSA colonization surveillance program. It is not intended to diagnose MRSA infection nor to guide or monitor treatment for MRSA infections.   Surgical PCR screen     Status: Abnormal   Collection Time: 01/27/16 11:50 AM  Result Value Ref Range Status   MRSA, PCR NEGATIVE NEGATIVE Final   Staphylococcus aureus POSITIVE (A) NEGATIVE Final    Comment:        The Xpert SA Assay (FDA approved for NASAL specimens in patients over 27 years of age), is one component of a comprehensive surveillance program.  Test performance has been validated by [facility] for patients greater than or equal to 7 year old. It is not intended to diagnose infection nor to guide or monitor  treatment. RESULT CALLED TO, READ BACK BY AND VERIFIED WITH:  MCGIBBONY,C @ 1847 ON 01/27/16 BY [place] Planas  Studies/Results: No results found.  Medications: I have reviewed the patient's current medications.  Assesment:   Active Problems:   Diabetes mellitus (HCC)   Schizophrenia (HCC)   Iron deficiency anemia   HLD (hyperlipidemia)   GERD (gastroesophageal reflux disease)   Colon tumor   Colonic mass      Plan:  Medications reviewed As perr surgery plan  LOS: 3 days   Catalena Stanhope 01/28/2016, 8:00 AM

## 2016-01-28 NOTE — Progress Notes (Signed)
4 Days Post-Op  Subjective: No complaints.  Objective: Vital signs in last 24 hours: Temp:  [98 F (36.7 C)-98.5 F (36.9 C)] 98.5 F (36.9 C) (06/25 0529) Pulse Rate:  [57-78] 57 (06/25 0529) Resp:  [18-20] 20 (06/25 0529) BP: (93-124)/(45-58) 93/45 mmHg (06/25 0529) SpO2:  [95 %-99 %] 98 % (06/25 0529) Last BM Date: 01/25/16  Intake/Output from previous day: 06/24 0701 - 06/25 0700 In: 720 [P.O.:720] Out: 4200 [Urine:4200] Intake/Output this shift:    General appearance: alert, cooperative and no distress Resp: clear to auscultation bilaterally Cardio: regular rate and rhythm, S1, S2 normal, no murmur, click, rub or gallop GI: soft, non-tender; bowel sounds normal; no masses,  no organomegaly  Lab Results:   Recent Labs  01/26/16 0542 01/28/16 0559  WBC 10.2 8.2  HGB 9.3* 8.7*  HCT 29.9* 28.0*  PLT 340 322   BMET  Recent Labs  01/26/16 0542 01/28/16 0559  NA 141 141  K 3.5 3.7  CL 111 113*  CO2 25 26  GLUCOSE 123* 102*  BUN 7 9  CREATININE 0.67 0.62  CALCIUM 7.8* 8.2*   PT/INR No results for input(s): LABPROT, INR in the last 72 hours.  Studies/Results: No results found.  Anti-infectives: Anti-infectives    None      Assessment/Plan: Impression: Colon neoplasm, suspicious for adenocarcinoma. Plan: We'll proceed with partial colectomy, Port-A-Cath insertion tomorrow. Preoperative orders have been written. Will receive one unit packed red blood cells today. The risks and benefits of the procedure were fully explained to the patient, who gave informed consent.  LOS: 3 days    Jock Mahon A 01/28/2016

## 2016-01-29 ENCOUNTER — Inpatient Hospital Stay (HOSPITAL_COMMUNITY): Payer: Medicaid Other | Admitting: Anesthesiology

## 2016-01-29 ENCOUNTER — Inpatient Hospital Stay (HOSPITAL_COMMUNITY): Payer: Medicaid Other

## 2016-01-29 ENCOUNTER — Encounter (HOSPITAL_COMMUNITY): Payer: Self-pay | Admitting: Anesthesiology

## 2016-01-29 ENCOUNTER — Encounter (HOSPITAL_COMMUNITY)
Admission: RE | Disposition: A | Discharge status: Nursing Home (Includes ICF) | Payer: Self-pay | Source: Ambulatory Visit | Attending: Internal Medicine

## 2016-01-29 HISTORY — PX: PARTIAL COLECTOMY: SHX5273

## 2016-01-29 HISTORY — PX: PORTACATH PLACEMENT: SHX2246

## 2016-01-29 HISTORY — PX: LIVER BIOPSY: SHX301

## 2016-01-29 LAB — GLUCOSE, CAPILLARY
GLUCOSE-CAPILLARY: 89 mg/dL (ref 65–99)
GLUCOSE-CAPILLARY: 186 mg/dL — AB (ref 65–99)
Glucose-Capillary: 95 mg/dL (ref 65–99)
Glucose-Capillary: 153 mg/dL — ABNORMAL HIGH (ref 65–99)
Glucose-Capillary: 164 mg/dL — ABNORMAL HIGH (ref 65–99)

## 2016-01-29 LAB — CBC
HCT: 29.2 % — ABNORMAL LOW (ref 36.0–46.0)
Hemoglobin: 9.2 g/dL — ABNORMAL LOW (ref 12.0–15.0)
MCH: 22.7 pg — AB (ref 26.0–34.0)
MCHC: 31.5 g/dL (ref 30.0–36.0)
MCV: 72.1 fL — ABNORMAL LOW (ref 78.0–100.0)
PLATELETS: 305 10*3/uL (ref 150–400)
RBC: 4.05 MIL/uL (ref 3.87–5.11)
RDW: 19.1 % — ABNORMAL HIGH (ref 11.5–15.5)
WBC: 7.8 10*3/uL (ref 4.0–10.5)

## 2016-01-29 LAB — BASIC METABOLIC PANEL
Anion gap: 3 — ABNORMAL LOW (ref 5–15)
BUN: 6 mg/dL (ref 6–20)
CALCIUM: 8.2 mg/dL — AB (ref 8.9–10.3)
CO2: 26 mmol/L (ref 22–32)
CREATININE: 0.59 mg/dL (ref 0.44–1.00)
Chloride: 116 mmol/L — ABNORMAL HIGH (ref 101–111)
Glucose, Bld: 92 mg/dL (ref 65–99)
Potassium: 3.8 mmol/L (ref 3.5–5.1)
SODIUM: 145 mmol/L (ref 135–145)

## 2016-01-29 SURGERY — COLECTOMY, PARTIAL
Anesthesia: General | Site: Abdomen

## 2016-01-29 MED ORDER — FENTANYL CITRATE (PF) 250 MCG/5ML IJ SOLN
INTRAMUSCULAR | Status: DC | PRN
  Administered 2016-01-29 (×7): 50 ug via INTRAVENOUS

## 2016-01-29 MED ORDER — ONDANSETRON 4 MG PO TBDP: 4.0000 mg | ORAL_TABLET | Freq: Four times a day (QID) | ORAL | Status: DC | PRN

## 2016-01-29 MED ORDER — SODIUM CHLORIDE 0.9 % IV SOLN
INTRAVENOUS | Status: DC | PRN
  Administered 2016-01-29: 5 mL

## 2016-01-29 MED ORDER — HEMOSTATIC AGENTS (NO CHARGE) OPTIME
TOPICAL | Status: DC | PRN
  Administered 2016-01-29: 1

## 2016-01-29 MED ORDER — POVIDONE-IODINE 10 % EX OINT
TOPICAL_OINTMENT | CUTANEOUS | Status: AC
  Filled 2016-01-29: qty 1

## 2016-01-29 MED ORDER — ONDANSETRON HCL 4 MG/2ML IJ SOLN: 4.0000 mg | Freq: Four times a day (QID) | INTRAMUSCULAR | Status: DC | PRN

## 2016-01-29 MED ORDER — CHLORHEXIDINE GLUCONATE CLOTH 2 % EX PADS: 6.0000 | MEDICATED_PAD | Freq: Once | CUTANEOUS | Status: AC

## 2016-01-29 MED ORDER — ENOXAPARIN SODIUM 40 MG/0.4ML ~~LOC~~ SOLN
40.0000 mg | SUBCUTANEOUS | Status: DC
  Administered 2016-01-30 – 2016-02-02 (×4): 40 mg via SUBCUTANEOUS
  Filled 2016-01-29 (×4): qty 0.4

## 2016-01-29 MED ORDER — BUPIVACAINE LIPOSOME 1.3 % IJ SUSP
INTRAMUSCULAR | Status: DC | PRN
  Administered 2016-01-29: 20 mL

## 2016-01-29 MED ORDER — POVIDONE-IODINE 10 % OINT PACKET
TOPICAL_OINTMENT | CUTANEOUS | Status: DC | PRN
  Administered 2016-01-29: 1 via TOPICAL

## 2016-01-29 MED ORDER — ROCURONIUM BROMIDE 50 MG/5ML IV SOLN
INTRAVENOUS | Status: AC
  Filled 2016-01-29: qty 1

## 2016-01-29 MED ORDER — PHENYLEPHRINE HCL 10 MG/ML IJ SOLN
INTRAMUSCULAR | Status: DC | PRN
  Administered 2016-01-29: 80 ug via INTRAVENOUS

## 2016-01-29 MED ORDER — ACETAMINOPHEN 650 MG RE SUPP
650.0000 mg | Freq: Four times a day (QID) | RECTAL | Status: DC | PRN
Start: 2016-01-29 — End: 2016-02-02

## 2016-01-29 MED ORDER — MEPERIDINE HCL 50 MG/ML IJ SOLN: 6.2500 mg | INTRAMUSCULAR | Status: DC | PRN

## 2016-01-29 MED ORDER — LORAZEPAM 2 MG/ML IJ SOLN: 1.0000 mg | INTRAMUSCULAR | Status: DC | PRN

## 2016-01-29 MED ORDER — ACETAMINOPHEN 325 MG PO TABS: 650.0000 mg | ORAL_TABLET | Freq: Four times a day (QID) | ORAL | Status: DC | PRN

## 2016-01-29 MED ORDER — EPHEDRINE SULFATE 50 MG/ML IJ SOLN
INTRAMUSCULAR | Status: DC | PRN
  Administered 2016-01-29: 10 mg via INTRAVENOUS
  Administered 2016-01-29: 5 mg via INTRAVENOUS
  Administered 2016-01-29: 10 mg via INTRAVENOUS

## 2016-01-29 MED ORDER — LACTATED RINGERS IV SOLN: INTRAVENOUS | Status: DC

## 2016-01-29 MED ORDER — LIDOCAINE HCL 1 % IJ SOLN
INTRAMUSCULAR | Status: DC | PRN
  Administered 2016-01-29: 40 mg via INTRADERMAL

## 2016-01-29 MED ORDER — KETOROLAC TROMETHAMINE 30 MG/ML IJ SOLN
30.0000 mg | Freq: Once | INTRAMUSCULAR | Status: AC
  Administered 2016-01-29: 30 mg via INTRAVENOUS
  Filled 2016-01-29: qty 1

## 2016-01-29 MED ORDER — INSULIN ASPART 100 UNIT/ML ~~LOC~~ SOLN
0.0000 [IU] | Freq: Three times a day (TID) | SUBCUTANEOUS | Status: DC
  Administered 2016-01-29 – 2016-01-30 (×3): 3 [IU] via SUBCUTANEOUS
  Administered 2016-01-31 – 2016-02-02 (×4): 2 [IU] via SUBCUTANEOUS

## 2016-01-29 MED ORDER — PROPOFOL 10 MG/ML IV BOLUS
INTRAVENOUS | Status: AC
  Filled 2016-01-29: qty 20

## 2016-01-29 MED ORDER — PROMETHAZINE HCL 25 MG/ML IJ SOLN: 6.2500 mg | INTRAMUSCULAR | Status: DC | PRN

## 2016-01-29 MED ORDER — HYDROMORPHONE HCL 1 MG/ML IJ SOLN
1.0000 mg | INTRAMUSCULAR | Status: DC | PRN
Start: 2016-01-29 — End: 2016-02-02

## 2016-01-29 MED ORDER — ROCURONIUM BROMIDE 100 MG/10ML IV SOLN
INTRAVENOUS | Status: DC | PRN
  Administered 2016-01-29: 10 mg via INTRAVENOUS
  Administered 2016-01-29: 30 mg via INTRAVENOUS
  Administered 2016-01-29 (×3): 10 mg via INTRAVENOUS

## 2016-01-29 MED ORDER — FLUPHENAZINE HCL 5 MG PO TABS
5.0000 mg | ORAL_TABLET | Freq: Two times a day (BID) | ORAL | Status: DC
  Administered 2016-01-30 – 2016-02-02 (×7): 5 mg via ORAL
  Filled 2016-01-29 (×13): qty 1

## 2016-01-29 MED ORDER — LACTATED RINGERS IV SOLN
INTRAVENOUS | Status: DC
  Administered 2016-01-29 (×2): via INTRAVENOUS

## 2016-01-29 MED ORDER — NEOSTIGMINE METHYLSULFATE 10 MG/10ML IV SOLN
INTRAVENOUS | Status: DC | PRN
  Administered 2016-01-29: 3 mg via INTRAVENOUS

## 2016-01-29 MED ORDER — FENTANYL CITRATE (PF) 100 MCG/2ML IJ SOLN
25.0000 ug | INTRAMUSCULAR | Status: DC | PRN
  Administered 2016-01-29: 50 ug via INTRAVENOUS

## 2016-01-29 MED ORDER — PROPOFOL 10 MG/ML IV BOLUS
INTRAVENOUS | Status: DC | PRN
  Administered 2016-01-29: 170 mg via INTRAVENOUS

## 2016-01-29 MED ORDER — FENTANYL CITRATE (PF) 250 MCG/5ML IJ SOLN
INTRAMUSCULAR | Status: AC
  Filled 2016-01-29: qty 5

## 2016-01-29 MED ORDER — CEFOTETAN DISODIUM-DEXTROSE 2-2.08 GM-% IV SOLR
2.0000 g | INTRAVENOUS | Status: AC
  Administered 2016-01-29: 2 g via INTRAVENOUS
  Filled 2016-01-29: qty 50

## 2016-01-29 MED ORDER — GLYCOPYRROLATE 0.2 MG/ML IJ SOLN
INTRAMUSCULAR | Status: DC | PRN
  Administered 2016-01-29: .5 mg via INTRAVENOUS

## 2016-01-29 MED ORDER — PHENYLEPHRINE 40 MCG/ML (10ML) SYRINGE FOR IV PUSH (FOR BLOOD PRESSURE SUPPORT)
PREFILLED_SYRINGE | INTRAVENOUS | Status: AC
  Filled 2016-01-29: qty 10

## 2016-01-29 MED ORDER — FLUPHENAZINE HCL 1 MG PO TABS
5.0000 mg | ORAL_TABLET | Freq: Once | ORAL | Status: AC
  Administered 2016-01-29: 5 mg via ORAL
  Filled 2016-01-29: qty 5

## 2016-01-29 MED ORDER — LIDOCAINE HCL (PF) 1 % IJ SOLN
INTRAMUSCULAR | Status: AC
  Filled 2016-01-29: qty 5

## 2016-01-29 MED ORDER — FENTANYL CITRATE (PF) 100 MCG/2ML IJ SOLN
INTRAMUSCULAR | Status: AC
  Filled 2016-01-29: qty 2

## 2016-01-29 MED ORDER — DIVALPROEX SODIUM ER 250 MG PO TB24
250.0000 mg | ORAL_TABLET | Freq: Every day | ORAL | Status: DC
  Administered 2016-01-29 – 2016-02-01 (×4): 250 mg via ORAL
  Filled 2016-01-29 (×7): qty 1

## 2016-01-29 MED ORDER — HYDROCODONE-ACETAMINOPHEN 5-325 MG PO TABS
1.0000 | ORAL_TABLET | ORAL | Status: DC | PRN
  Administered 2016-01-29 – 2016-01-30 (×3): 1 via ORAL
  Filled 2016-01-29 (×3): qty 1

## 2016-01-29 MED ORDER — HEPARIN SOD (PORK) LOCK FLUSH 100 UNIT/ML IV SOLN
INTRAVENOUS | Status: DC | PRN
  Administered 2016-01-29: 500 [IU]

## 2016-01-29 MED ORDER — SIMETHICONE 80 MG PO CHEW: 40.0000 mg | CHEWABLE_TABLET | Freq: Four times a day (QID) | ORAL | Status: DC | PRN

## 2016-01-29 MED ORDER — ALVIMOPAN 12 MG PO CAPS
12.0000 mg | ORAL_CAPSULE | Freq: Two times a day (BID) | ORAL | Status: DC
  Administered 2016-01-30 – 2016-02-01 (×6): 12 mg via ORAL
  Filled 2016-01-29 (×6): qty 1

## 2016-01-29 MED ORDER — LACTATED RINGERS IV SOLN
INTRAVENOUS | Status: DC
  Administered 2016-01-29 – 2016-01-31 (×4): via INTRAVENOUS

## 2016-01-29 MED ORDER — 0.9 % SODIUM CHLORIDE (POUR BTL) OPTIME
TOPICAL | Status: DC | PRN
  Administered 2016-01-29: 2000 mL
  Administered 2016-01-29: 1000 mL

## 2016-01-29 MED ORDER — FLUPHENAZINE HCL 5 MG PO TABS
5.0000 mg | ORAL_TABLET | Freq: Two times a day (BID) | ORAL | Status: DC
  Filled 2016-01-29 (×4): qty 1

## 2016-01-29 MED ORDER — ALVIMOPAN 12 MG PO CAPS: 12.0000 mg | ORAL_CAPSULE | Freq: Once | ORAL | Status: DC

## 2016-01-29 MED ORDER — HEPARIN SOD (PORK) LOCK FLUSH 100 UNIT/ML IV SOLN
INTRAVENOUS | Status: AC
  Filled 2016-01-29: qty 5

## 2016-01-29 MED ORDER — BUPIVACAINE LIPOSOME 1.3 % IJ SUSP
INTRAMUSCULAR | Status: AC
  Filled 2016-01-29: qty 20

## 2016-01-29 MED ORDER — FENTANYL CITRATE (PF) 100 MCG/2ML IJ SOLN
INTRAMUSCULAR | Status: AC
Start: 2016-01-29 — End: 2016-01-29
  Filled 2016-01-29: qty 2

## 2016-01-29 SURGICAL SUPPLY — 67 items
BAG DECANTER FOR FLEXI CONT (MISCELLANEOUS) ×4 IMPLANT
BAG HAMPER (MISCELLANEOUS) ×4 IMPLANT
CHLORAPREP W/TINT 10.5 ML (MISCELLANEOUS) ×4 IMPLANT
CHLORAPREP W/TINT 26ML (MISCELLANEOUS) ×4 IMPLANT
CLOTH BEACON ORANGE TIMEOUT ST (SAFETY) ×4 IMPLANT
COVER LIGHT HANDLE STERIS (MISCELLANEOUS) ×8 IMPLANT
COVER MAYO STAND XLG (DRAPE) ×8 IMPLANT
DECANTER SPIKE VIAL GLASS SM (MISCELLANEOUS) ×4 IMPLANT
DERMABOND ADVANCED (GAUZE/BANDAGES/DRESSINGS) ×2
DERMABOND ADVANCED .7 DNX12 (GAUZE/BANDAGES/DRESSINGS) ×2 IMPLANT
DRAPE C-ARM FOLDED MOBILE STRL (DRAPES) ×4 IMPLANT
DRAPE UTILITY W/TAPE 26X15 (DRAPES) ×8 IMPLANT
DRAPE WARM FLUID 44X44 (DRAPE) ×4 IMPLANT
DRSG OPSITE POSTOP 4X10 (GAUZE/BANDAGES/DRESSINGS) ×4 IMPLANT
ELECT BLADE 6 FLAT ULTRCLN (ELECTRODE) ×4 IMPLANT
ELECT REM PT RETURN 9FT ADLT (ELECTROSURGICAL) ×4
ELECTRODE REM PT RTRN 9FT ADLT (ELECTROSURGICAL) ×2 IMPLANT
GLOVE BIO SURGEON STRL SZ7 (GLOVE) ×8 IMPLANT
GLOVE BIOGEL PI IND STRL 7.0 (GLOVE) ×6 IMPLANT
GLOVE BIOGEL PI INDICATOR 7.0 (GLOVE) ×6
GLOVE ECLIPSE 7.0 STRL STRAW (GLOVE) ×8 IMPLANT
GLOVE SURG SS PI 7.5 STRL IVOR (GLOVE) ×16 IMPLANT
GOWN STRL REUS W/ TWL XL LVL3 (GOWN DISPOSABLE) ×4 IMPLANT
GOWN STRL REUS W/TWL LRG LVL3 (GOWN DISPOSABLE) ×20 IMPLANT
GOWN STRL REUS W/TWL XL LVL3 (GOWN DISPOSABLE) ×4
HANDLE SUCTION POOLE (INSTRUMENTS) ×2 IMPLANT
INST SET MAJOR GENERAL (KITS) ×4 IMPLANT
IV NS 500ML (IV SOLUTION) ×2
IV NS 500ML BAXH (IV SOLUTION) ×2 IMPLANT
KIT BLADEGUARD II DBL (SET/KITS/TRAYS/PACK) ×4 IMPLANT
KIT PORT POWER 8FR ISP MRI (Port) ×4 IMPLANT
KIT ROOM TURNOVER APOR (KITS) ×4 IMPLANT
LIGASURE IMPACT 36 18CM CVD LR (INSTRUMENTS) ×4 IMPLANT
MANIFOLD NEPTUNE II (INSTRUMENTS) ×4 IMPLANT
NEEDLE BIOPSY 14GX4.5 SOFT TIS (NEEDLE) ×4 IMPLANT
NEEDLE HYPO 18GX1.5 BLUNT FILL (NEEDLE) ×4 IMPLANT
NEEDLE HYPO 21X1.5 SAFETY (NEEDLE) ×4 IMPLANT
NEEDLE HYPO 25X1 1.5 SAFETY (NEEDLE) ×4 IMPLANT
NS IRRIG 1000ML POUR BTL (IV SOLUTION) ×8 IMPLANT
PACK ABDOMINAL MAJOR (CUSTOM PROCEDURE TRAY) ×4 IMPLANT
PACK MINOR (CUSTOM PROCEDURE TRAY) ×4 IMPLANT
PAD ARMBOARD 7.5X6 YLW CONV (MISCELLANEOUS) ×4 IMPLANT
PAD TELFA 3X4 1S STER (GAUZE/BANDAGES/DRESSINGS) ×4 IMPLANT
PENCIL HANDSWITCHING (ELECTRODE) ×12 IMPLANT
RELOAD PROXIMATE 75MM BLUE (ENDOMECHANICALS) ×12 IMPLANT
RETRACTOR WND ALEXIS 25 LRG (MISCELLANEOUS) ×2 IMPLANT
RTRCTR WOUND ALEXIS 25CM LRG (MISCELLANEOUS) ×4
SET BASIN LINEN APH (SET/KITS/TRAYS/PACK) ×4 IMPLANT
SPONGE LAP 18X18 X RAY DECT (DISPOSABLE) ×4 IMPLANT
STAPLER GUN LINEAR PROX 60 (STAPLE) ×4 IMPLANT
STAPLER PROXIMATE 75MM BLUE (STAPLE) ×4 IMPLANT
STAPLER VISISTAT (STAPLE) ×4 IMPLANT
SUCTION POOLE HANDLE (INSTRUMENTS) ×4
SUCTION YANKAUER HANDLE (MISCELLANEOUS) ×4 IMPLANT
SUT CHROMIC 2 0 SH (SUTURE) ×4 IMPLANT
SUT PDS AB 0 CTX 60 (SUTURE) ×8 IMPLANT
SUT SILK 2 0 REEL (SUTURE) ×4 IMPLANT
SUT SILK 3 0 SH CR/8 (SUTURE) ×4 IMPLANT
SUT VIC AB 3-0 SH 27 (SUTURE) ×2
SUT VIC AB 3-0 SH 27X BRD (SUTURE) ×2 IMPLANT
SUT VIC AB 4-0 PS2 27 (SUTURE) ×4 IMPLANT
SYR 20CC LL (SYRINGE) ×4 IMPLANT
SYR CONTROL 10ML LL (SYRINGE) ×4 IMPLANT
TOWEL BLUE STERILE X RAY DET (MISCELLANEOUS) ×4 IMPLANT
TOWEL OR 17X26 4PK STRL BLUE (TOWEL DISPOSABLE) ×4 IMPLANT
TRAY FOLEY CATH SILVER 16FR (SET/KITS/TRAYS/PACK) ×4 IMPLANT
YANKAUER SUCT BULB TIP 10FT TU (MISCELLANEOUS) ×4 IMPLANT

## 2016-01-29 NOTE — Transfer of Care (Signed)
Immediate Anesthesia Transfer of Care Note  Patient: Mary Wells  Procedure(s) Performed: Procedure(s): PARTIAL COLECTOMY  (N/A) INSERTION PORT-A-CATH (N/A) LIVER BIOPSY (N/A)  Patient Location: PACU  Anesthesia Type:General  Level of Consciousness: awake and alert   Airway & Oxygen Therapy: Patient Spontanous Breathing and Patient connected to face mask oxygen  Post-op Assessment: Report given to RN  Post vital signs: Reviewed and stable  Last Vitals:  Filed Vitals:   01/29/16 1230 01/29/16 1235  BP: 105/51 103/55  Pulse:    Temp:    Resp: 10 11    Last Pain:  Filed Vitals:   01/29/16 1236  PainSc: 0-No pain         Complications: No apparent anesthesia complications

## 2016-01-29 NOTE — Anesthesia Procedure Notes (Signed)
Procedure Name: Intubation Date/Time: 01/29/2016 12:52 PM Performed by: Tressie Stalker E Pre-anesthesia Checklist: Patient identified, Patient being monitored, Timeout performed, Emergency Drugs available and Suction available Patient Re-evaluated:Patient Re-evaluated prior to inductionOxygen Delivery Method: Circle system utilized Preoxygenation: Pre-oxygenation with 100% oxygen Intubation Type: IV induction Ventilation: Mask ventilation without difficulty Laryngoscope Size: Mac and 3 Grade View: Grade II Tube type: Oral Tube size: 7.0 mm Number of attempts: 1 Airway Equipment and Method: Stylet Placement Confirmation: ETT inserted through vocal cords under direct vision,  positive ETCO2 and breath sounds checked- equal and bilateral Secured at: 21 cm Tube secured with: Tape Dental Injury: Teeth and Oropharynx as per pre-operative assessment

## 2016-01-29 NOTE — Anesthesia Postprocedure Evaluation (Signed)
Anesthesia Post Note  Patient: Cornelious Bryant  Procedure(s) Performed: Procedure(s) (LRB): PARTIAL COLECTOMY  (N/A) INSERTION PORT-A-CATH (N/A) LIVER BIOPSY (N/A)  Patient location during evaluation: PACU Anesthesia Type: General Level of consciousness: awake and alert Pain management: pain level controlled Vital Signs Assessment: post-procedure vital signs reviewed and stable Respiratory status: spontaneous breathing Cardiovascular status: blood pressure returned to baseline Postop Assessment: no signs of nausea or vomiting Anesthetic complications: no    Last Vitals:  Filed Vitals:   01/29/16 1230 01/29/16 1235  BP: 105/51 103/55  Pulse:    Temp:    Resp: 10 11    Last Pain:  Filed Vitals:   01/29/16 1236  PainSc: 0-No pain                 Daysy Santini

## 2016-01-29 NOTE — Progress Notes (Signed)
Subjective: Patient is scheduled for surgery today. No new complaint. Objective Vital signs in last 24 hours: Temp:  [98.1 F (36.7 C)-98.6 F (37 C)] 98.6 F (37 C) (06/26 0531) Pulse Rate:  [60-79] 79 (06/26 0531) Resp:  [16-20] 20 (06/26 0531) BP: (99-106)/(51-79) 105/63 mmHg (06/26 0531) SpO2:  [97 %-100 %] 100 % (06/26 0531) Weight change:  Last BM Date: 01/25/16  Intake/Output from previous day: 06/25 0701 - 06/26 0700 In: 1220 [P.O.:720; I.V.:500] Out: 1901 [Urine:1900; Stool:1]  PHYSICAL EXAM General appearance: alert and no distress Resp: clear to auscultation bilaterally Cardio: S1, S2 normal GI: soft, non-tender; bowel sounds normal; no masses,  no organomegaly Extremities: extremities normal, atraumatic, no cyanosis or edema  Lab Results:  Results for orders placed or performed during the hospital encounter of 01/22/16 (from the past 48 hour(s))  Glucose, capillary     Status: Abnormal   Collection Time: 01/27/16 11:28 AM  Result Value Ref Range   Glucose-Capillary 106 (H) 65 - 99 mg/dL  Surgical PCR screen     Status: Abnormal   Collection Time: 01/27/16 11:50 AM  Result Value Ref Range   MRSA, PCR NEGATIVE NEGATIVE   Staphylococcus aureus POSITIVE (A) NEGATIVE    Comment:        The Xpert SA Assay (FDA approved for NASAL specimens in patients over 20 years of age), is one component of a comprehensive surveillance program.  Test performance has been validated by [facility] for patients greater than or equal to 11 year old. It is not intended to diagnose infection nor to guide or monitor treatment. RESULT CALLED TO, READ BACK BY AND VERIFIED WITH:  MCGIBBONY,C @ 1847 ON 01/27/16 BY WOODIE,J   Glucose, capillary     Status: Abnormal   Collection Time: 01/27/16  4:20 PM  Result Value Ref Range   Glucose-Capillary 105 (H) 65 - 99 mg/dL  Glucose, capillary     Status: Abnormal   Collection Time: 01/27/16  8:42 PM  Result Value Ref Range   Glucose-Capillary 144 (H) 65 - 99 mg/dL   Comment 1 Notify RN    Comment 2 Document in Chart   CBC     Status: Abnormal   Collection Time: 01/28/16  5:59 AM  Result Value Ref Range   WBC 8.2 4.0 - 10.5 K/uL   RBC 3.95 3.87 - 5.11 MIL/uL   Hemoglobin 8.7 (L) 12.0 - 15.0 g/dL   HCT 28.0 (L) 36.0 - 46.0 %   MCV 70.9 (L) 78.0 - 100.0 fL   MCH 22.0 (L) 26.0 - 34.0 pg   MCHC 31.1 30.0 - 36.0 g/dL   RDW 18.3 (H) 11.5 - 15.5 %   Platelets 322 150 - 400 K/uL  Basic metabolic panel     Status: Abnormal   Collection Time: 01/28/16  5:59 AM  Result Value Ref Range   Sodium 141 135 - 145 mmol/L   Potassium 3.7 3.5 - 5.1 mmol/L   Chloride 113 (H) 101 - 111 mmol/L   CO2 26 22 - 32 mmol/L   Glucose, Bld 102 (H) 65 - 99 mg/dL   BUN 9 6 - 20 mg/dL   Creatinine, Ser 0.62 0.44 - 1.00 mg/dL   Calcium 8.2 (L) 8.9 - 10.3 mg/dL   GFR calc non Af Amer >60 >60 mL/min   GFR calc Af Amer >60 >60 mL/min    Comment: (NOTE) The eGFR has been calculated using the CKD EPI equation. This calculation has not been validated  in all clinical situations. eGFR's persistently <60 mL/min signify possible Chronic Kidney Disease.    Anion gap 2 (L) 5 - 15    Comment: REPEATED TO VERIFY  Glucose, capillary     Status: Abnormal   Collection Time: 01/28/16  8:01 AM  Result Value Ref Range   Glucose-Capillary 113 (H) 65 - 99 mg/dL  Glucose, capillary     Status: None   Collection Time: 01/28/16 11:55 AM  Result Value Ref Range   Glucose-Capillary 97 65 - 99 mg/dL  Glucose, capillary     Status: None   Collection Time: 01/28/16  4:33 PM  Result Value Ref Range   Glucose-Capillary 81 65 - 99 mg/dL  Basic metabolic panel     Status: Abnormal   Collection Time: 01/29/16  4:48 AM  Result Value Ref Range   Sodium 145 135 - 145 mmol/L   Potassium 3.8 3.5 - 5.1 mmol/L   Chloride 116 (H) 101 - 111 mmol/L   CO2 26 22 - 32 mmol/L   Glucose, Bld 92 65 - 99 mg/dL   BUN 6 6 - 20 mg/dL   Creatinine, Ser 0.59 0.44 - 1.00  mg/dL   Calcium 8.2 (L) 8.9 - 10.3 mg/dL   GFR calc non Af Amer >60 >60 mL/min   GFR calc Af Amer >60 >60 mL/min    Comment: (NOTE) The eGFR has been calculated using the CKD EPI equation. This calculation has not been validated in all clinical situations. eGFR's persistently <60 mL/min signify possible Chronic Kidney Disease.    Anion gap 3 (L) 5 - 15  CBC     Status: Abnormal   Collection Time: 01/29/16  4:48 AM  Result Value Ref Range   WBC 7.8 4.0 - 10.5 K/uL   RBC 4.05 3.87 - 5.11 MIL/uL   Hemoglobin 9.2 (L) 12.0 - 15.0 g/dL   HCT 29.2 (L) 36.0 - 46.0 %   MCV 72.1 (L) 78.0 - 100.0 fL   MCH 22.7 (L) 26.0 - 34.0 pg   MCHC 31.5 30.0 - 36.0 g/dL   RDW 19.1 (H) 11.5 - 15.5 %   Platelets 305 150 - 400 K/uL  Glucose, capillary     Status: None   Collection Time: 01/29/16  7:21 AM  Result Value Ref Range   Glucose-Capillary 89 65 - 99 mg/dL    ABGS No results for input(s): PHART, PO2ART, TCO2, HCO3 in the last 72 hours.  Invalid input(s): PCO2 CULTURES Recent Results (from the past 240 hour(s))  MRSA PCR Screening     Status: None   Collection Time: 01/22/16 12:42 PM  Result Value Ref Range Status   MRSA by PCR NEGATIVE NEGATIVE Final    Comment:        The GeneXpert MRSA Assay (FDA approved for NASAL specimens only), is one component of a comprehensive MRSA colonization surveillance program. It is not intended to diagnose MRSA infection nor to guide or monitor treatment for MRSA infections.   Surgical PCR screen     Status: Abnormal   Collection Time: 01/27/16 11:50 AM  Result Value Ref Range Status   MRSA, PCR NEGATIVE NEGATIVE Final   Staphylococcus aureus POSITIVE (A) NEGATIVE Final    Comment:        The Xpert SA Assay (FDA approved for NASAL specimens in patients over 49 years of age), is one component of a comprehensive surveillance program.  Test performance has been validated by [facility] for patients greater than or equal  to 32 year old. It is  not intended to diagnose infection nor to guide or monitor treatment. RESULT CALLED TO, READ BACK BY AND VERIFIED WITH:  MCGIBBONY,C @ 1847 ON 01/27/16 BY WOODIE,J    Studies/Results: No results found.  Medications: I have reviewed the patient's current medications.  Assesment:   Active Problems:   Diabetes mellitus (HCC)   Schizophrenia (HCC)   Iron deficiency anemia   HLD (hyperlipidemia)   GERD (gastroesophageal reflux disease)   Colon tumor   Colonic mass      Plan:  Medications reviewed As perr surgery plan  LOS: 4 days   Mary Wells 01/29/2016, 7:48 AM

## 2016-01-29 NOTE — Op Note (Signed)
Patient:  Mary Wells  DOB:  1955-12-11  MRN:  [id number]   Preop Diagnosis:  Colon carcinoma  Postop Diagnosis:  Same, carcinomatosis, metastatic disease to liver  Procedure:  Partial colectomy with resection of terminal ileum, liver biopsy, Port-A-Cath insertion  Surgeon:  Aviva Signs, M.D.   Assistant: Tama High, M.D.  Anes:   Gen. endotracheal  Indications:   Patient is a 60 year old schizophrenic black female long-standing history of anemia who was recently found to have a large mass at the hepatic flexure. She also has evidence of metastatic disease to the liver. She does have a significantly elevated CEA level. The patient comes to the operating room for a partial colectomy with Port-A-Cath insertion. The risks and benefits of the procedure including bleeding, infection, cardiopulmonary difficulties, and the possibility of blood transfusion were fully explained to the patient, who gave informed consent.  Procedure note:   The patient was placed the supine position. After induction of general endotracheal anesthesia, the abdomen was prepped and draped using usual sterile technique with ChloraPrep. Surgical site confirmation was performed.  An upper midline incision was made from the umbilicus to the epigastric region. The peritoneal cavity was entered into without difficulty. The liver was inspected and multiple intrahepatic masses were noted. Along the right lateral aspect of the liver, a Tru-Cut biopsy of one of these masses was performed. This was sent to pathology further examination. The patient was noted to have a large bulky mass at the hepatic flexure. The right colon was mobilized along its peritoneal reflection. The dissection was taken around the hepatic flexure was some adhesions taken down from the liver bed. The gallbladder appeared to be within normal limits. The terminal ileum was mobilized. Multiple peritoneal implants were noted along the serosal surface and mesenteric  surfaces of the terminal ileum. A GIA-75 stapler was placed across the terminal ileum and fired. This was likewise done across the transverse colon, proximal to the middle colic artery. This was fired twice as a small section of the original remaining colon was noted to be ecchymotic. The mesentery was then divided down to its base using the LigaSure. The right artery and vein were ligated and divided using LigaSure and 2-0 silk ties. The specimen was removed from the operative field sent to pathology further examination. A side to side ileocolic anastomosis was performed using a GIA-75 stapler. The enterotomy was closed using a TA 60 stapler. The staple line was bolstered and covered with omentum using 3-0 silk sutures. The mesenteric defect was reapproximated using a 2-0 chromic running suture. The bowel was then returned into the abdominal cavity an orderly fashion. The abdominal cavity was copiously irrigated with normal saline. No abnormal bleeding was noted the end of the procedure. A small piece of Surgicel was placed along the liver edge due to overall area secondary to adhesions. All operating personnel then changed their gowns and close. I new setup was then used. The fascia was reapproximated using a looped 0 PDS running suture. The subcutaneous layer was irrigated normal saline. Exparel was instilled into the surrounding wound. The incision was closed using staples. Betadine ointment and dry sterile dressing were applied.  Next, a Port-A-Cath was inserted into the left subclavian vein. An incision was made inferior to the left clavicle. A subcutaneous pocket was then formed. A needle is advanced into the left subclavian vein using the Seldinger technique without difficulty. Guidewire was then advanced into the right atrium under fluoroscopic guidance. An introducer and peel-away sheath  were placed over the guidewire. The catheter was then inserted through the peel-away sheath the peel-away sheath was  removed. The cath was then attached to the port and the port placed in subcutaneous pocket. Adequate positioning was confirmed by fluoroscopy. Good backflow blood was noted in the port. The port was flushed with heparin flush. The subcutaneous layer was reapproximated using a 3-0 Vicryl interrupted suture. The skin was closed using a 4 Vicryl subcuticular suture. Dermabond was then applied.  All tape and needle counts were correct at the end of the procedure. Patient was extubated in the operating room and transferred to PACU in stable condition.  Complications:   None  EBL:   150 mL  Specimen:   Right colon adn terminal ileum, liver biopsy

## 2016-01-29 NOTE — Anesthesia Preprocedure Evaluation (Addendum)
Anesthesia Evaluation  Patient identified by MRN, date of birth, ID band Patient awake    Reviewed: Allergy & Precautions, NPO status , Patient's Chart, lab work & pertinent test results  Airway Mallampati: I  TM Distance: >3 FB Neck ROM: Full    Dental  (+) Edentulous Upper, Dental Advisory Given   Pulmonary former smoker,    + rhonchi        Cardiovascular negative cardio ROS   Rhythm:Regular Rate:Normal     Neuro/Psych PSYCHIATRIC DISORDERS Schizophrenia negative neurological ROS     GI/Hepatic Neg liver ROS, GERD  Medicated,  Endo/Other  diabetes, Type 2, Oral Hypoglycemic Agents  Renal/GU negative Renal ROS  negative genitourinary   Musculoskeletal negative musculoskeletal ROS (+)   Abdominal   Peds negative pediatric ROS (+)  Hematology   Anesthesia Other Findings   Reproductive/Obstetrics negative OB ROS                            Lab Results  Component Value Date   WBC 7.8 01/29/2016   HGB 9.2* 01/29/2016   HCT 29.2* 01/29/2016   MCV 72.1* 01/29/2016   PLT 305 01/29/2016   Lab Results  Component Value Date   CREATININE 0.59 01/29/2016   BUN 6 01/29/2016   NA 145 01/29/2016   K 3.8 01/29/2016   CL 116* 01/29/2016   CO2 26 01/29/2016   No results found for: INR, PROTIME  01/2016 EKG: normal sinus rhythm.  01/2016 Echo - Left ventricle: The cavity size was normal. Wall thickness was normal. Systolic function was normal. The estimated ejection fraction was in the range of 60% to 65%. Wall motion was normal; there were no regional wall motion abnormalities. Doppler parameters are consistent with abnormal left ventricular relaxation (grade 1 diastolic dysfunction). - Pericardium, extracardiac: A trivial pericardial effusion was identified.    Anesthesia Physical Anesthesia Plan  ASA: II  Anesthesia Plan: General   Post-op Pain Management:    Induction:  Intravenous  Airway Management Planned: Oral ETT  Additional Equipment:   Intra-op Plan:   Post-operative Plan: Extubation in OR  Informed Consent: I have reviewed the patients History and Physical, chart, labs and discussed the procedure including the risks, benefits and alternatives for the proposed anesthesia with the patient or authorized representative who has indicated his/her understanding and acceptance.   Dental advisory given  Plan Discussed with: CRNA  Anesthesia Plan Comments:         Anesthesia Quick Evaluation

## 2016-01-30 ENCOUNTER — Encounter (HOSPITAL_COMMUNITY): Payer: Self-pay | Admitting: General Surgery

## 2016-01-30 LAB — URINE CULTURE

## 2016-01-30 LAB — BASIC METABOLIC PANEL
ANION GAP: 6 (ref 5–15)
BUN: 5 mg/dL — ABNORMAL LOW (ref 6–20)
CALCIUM: 8.3 mg/dL — AB (ref 8.9–10.3)
CHLORIDE: 106 mmol/L (ref 101–111)
CO2: 25 mmol/L (ref 22–32)
Creatinine, Ser: 0.64 mg/dL (ref 0.44–1.00)
GFR calc Af Amer: >60 mL/min (ref >60)
GFR calc non Af Amer: >60 mL/min (ref >60)
GLUCOSE: 158 mg/dL — AB (ref 65–99)
POTASSIUM: 3.7 mmol/L (ref 3.5–5.1)
Sodium: 137 mmol/L (ref 135–145)

## 2016-01-30 LAB — TYPE AND SCREEN
ABO/RH(D): A POS
Antibody Screen: NEGATIVE
UNIT DIVISION: 0
UNIT DIVISION: 0
UNIT DIVISION: 0
UNIT DIVISION: 0

## 2016-01-30 LAB — HEMOGLOBIN A1C
HEMOGLOBIN A1C: 6.4 % — AB (ref 4.8–5.6)
Mean Plasma Glucose: 137 mg/dL

## 2016-01-30 LAB — MAGNESIUM: Magnesium: 1.5 mg/dL — ABNORMAL LOW (ref 1.7–2.4)

## 2016-01-30 LAB — GLUCOSE, CAPILLARY
GLUCOSE-CAPILLARY: 174 mg/dL — AB (ref 65–99)
Glucose-Capillary: 166 mg/dL — ABNORMAL HIGH (ref 65–99)
Glucose-Capillary: 117 mg/dL — ABNORMAL HIGH (ref 65–99)

## 2016-01-30 LAB — CBC
HEMATOCRIT: 33.8 % — AB (ref 36.0–46.0)
HEMOGLOBIN: 10.9 g/dL — AB (ref 12.0–15.0)
MCH: 23.2 pg — ABNORMAL LOW (ref 26.0–34.0)
MCHC: 32.2 g/dL (ref 30.0–36.0)
MCV: 71.9 fL — AB (ref 78.0–100.0)
Platelets: 331 10*3/uL (ref 150–400)
RBC: 4.7 MIL/uL (ref 3.87–5.11)
RDW: 19.5 % — ABNORMAL HIGH (ref 11.5–15.5)
WBC: 10.1 10*3/uL (ref 4.0–10.5)

## 2016-01-30 LAB — PHOSPHORUS: Phosphorus: 3.7 mg/dL (ref 2.5–4.6)

## 2016-01-30 MED ORDER — MAGNESIUM SULFATE 2 GM/50ML IV SOLN
2.0000 g | Freq: Once | INTRAVENOUS | Status: AC
  Administered 2016-01-30: 2 g via INTRAVENOUS
  Filled 2016-01-30: qty 50

## 2016-01-30 NOTE — Addendum Note (Signed)
Addendum  created 01/30/16 0755 by Mickel Baas, CRNA   Modules edited: Clinical Notes   Clinical Notes:  File: [id number]

## 2016-01-30 NOTE — Care Management Note (Signed)
Case Management Note  Patient Details  Name: Mary Wells MRN: [id number] Date of Birth: 04-30-1956  Expected Discharge Date:    02/02/2016              Expected Discharge Plan:  [facility]  In-House Referral:    Clinical Social Work  Discharge planning Services  CM Consult  Post Acute Care Choice:  NA Choice offered to:  NA  DME Arranged:    DME Agency:     HH Arranged:    [place] Agency:     Status of Service:  In process, will continue to follow  If discussed at Long Length of Stay Meetings, dates discussed:  01/30/2016  Additional Comments:  Sherald Barge, RN 01/30/2016, 2:23 PM

## 2016-01-30 NOTE — Progress Notes (Signed)
1 Day Post-Op  Subjective: Patient having mild incisional pain. Wants to sit up in chair.  Objective: Vital signs in last 24 hours: Temp:  [97.2 F (36.2 C)-98.1 F (36.7 C)] 98.1 F (36.7 C) (06/27 0541) Pulse Rate:  [44-121] 62 (06/27 0541) Resp:  [0-20] 20 (06/27 0541) BP: (92-144)/(45-84) 122/64 mmHg (06/27 0541) SpO2:  [94 %-100 %] 100 % (06/27 0541) Weight:  [67.586 kg (149 lb)] 67.586 kg (149 lb) (06/26 1123) Last BM Date: 01/29/16  Intake/Output from previous day: 06/26 0701 - 06/27 0700 In: 4245 [P.O.:600; I.V.:3645] Out: 2800 [Urine:2650; Blood:150] Intake/Output this shift:    General appearance: alert, cooperative and no distress Resp: clear to auscultation bilaterally Cardio: regular rate and rhythm, S1, S2 normal, no murmur, click, rub or gallop GI: Soft, dressing intact. Minimal bowel sounds appreciated.  Lab Results:   Recent Labs  01/29/16 0448 01/30/16 0452  WBC 7.8 10.1  HGB 9.2* 10.9*  HCT 29.2* 33.8*  PLT 305 331   BMET  Recent Labs  01/29/16 0448 01/30/16 0452  NA 145 137  K 3.8 3.7  CL 116* 106  CO2 26 25  GLUCOSE 92 158*  BUN 6 5*  CREATININE 0.59 0.64  CALCIUM 8.2* 8.3*   PT/INR No results for input(s): LABPROT, INR in the last 72 hours.  Studies/Results: Dg Chest Port 1 View  01/29/2016  CLINICAL DATA:  Status post partial colectomy and porta catheter insertion. EXAM: PORTABLE CHEST 1 VIEW COMPARISON:  01/26/2016 FINDINGS: Large pneumoperitoneum, Porta catheter from the left with tip at the upper cavoatrial junction. No evidence of pneumothorax. Stable heart size allowing for accentuation by low volumes. Mild postoperative atelectasis mainly noted about the left hilum. IMPRESSION: 1. Unremarkable porta catheter with tip at the upper cavoatrial junction. No evidence of pneumothorax. 2. Low volume chest with atelectasis. 3. Abdominal surgery today with large pneumoperitoneum. Electronically Signed   By: Monte Fantasia M.D.   On:  01/29/2016 15:25   Dg C-arm 1-60 Min-no Report  01/29/2016  CLINICAL DATA: portacath insertion C-ARM 1-60 MINUTES Fluoroscopy was utilized by the requesting physician.  No radiographic interpretation.    Anti-infectives: Anti-infectives    Start     Dose/Rate Route Frequency Ordered Stop   01/29/16 1143  cefoTEtan in Dextrose 5% (CEFOTAN) IVPB 2 g     2 g Intravenous On call to O.R. 01/29/16 1143 01/29/16 1255      Assessment/Plan: s/p Procedure(s): PARTIAL COLECTOMY  INSERTION PORT-A-CATH LIVER BIOPSY Impression: Stable on postoperative day 1. Mild hypomagnesemia noted and will be treated. Will adjust IV fluids. Will remove Foley. We'll get patient up to chair. Awaiting return of bowel function.  LOS: 5 days    Madilynne Mullan A 01/30/2016

## 2016-01-30 NOTE — Anesthesia Postprocedure Evaluation (Signed)
Anesthesia Post Note  Patient: Mary Wells  Procedure(s) Performed: Procedure(s) (LRB): PARTIAL COLECTOMY  (N/A) INSERTION PORT-A-CATH (Left) LIVER BIOPSY (N/A)  Patient location during evaluation: Nursing Unit Anesthesia Type: General Level of consciousness: awake and alert Pain management: pain level controlled Vital Signs Assessment: post-procedure vital signs reviewed and stable Respiratory status: spontaneous breathing Cardiovascular status: stable Postop Assessment: no signs of nausea or vomiting Anesthetic complications: no    Last Vitals:  Filed Vitals:   01/30/16 0203 01/30/16 0541  BP: 128/84 122/64  Pulse: 58 62  Temp: 36.4 C 36.7 C  Resp: 20 20    Last Pain:  Filed Vitals:   01/30/16 0542  PainSc: Asleep                 ADAMS, AMY A

## 2016-01-30 NOTE — Progress Notes (Signed)
Subjective: Patient sitting up. She tolerated her surgery well. She is started on thin liquid. No new complaint. Objective Vital signs in last 24 hours: Temp:  [97.2 F (36.2 C)-98.1 F (36.7 C)] 98.1 F (36.7 C) (06/27 0541) Pulse Rate:  [44-121] 62 (06/27 0541) Resp:  [0-20] 20 (06/27 0541) BP: (92-144)/(45-84) 122/64 mmHg (06/27 0541) SpO2:  [94 %-100 %] 100 % (06/27 0541) Weight:  [67.586 kg (149 lb)] 67.586 kg (149 lb) (06/26 1123) Weight change:  Last BM Date: 01/29/16  Intake/Output from previous day: 06/26 0701 - 06/27 0700 In: 4245 [P.O.:600; I.V.:3645] Out: 2800 [Urine:2650; Blood:150]  PHYSICAL EXAM General appearance: alert and no distress Resp: clear to auscultation bilaterally Cardio: S1, S2 normal GI: incision site clean, no tendernes, bowel sound ++ Extremities: extremities normal, atraumatic, no cyanosis or edema  Lab Results:  Results for orders placed or performed during the hospital encounter of 01/22/16 (from the past 48 hour(s))  Glucose, capillary     Status: None   Collection Time: 01/28/16 11:55 AM  Result Value Ref Range   Glucose-Capillary 97 65 - 99 mg/dL  Glucose, capillary     Status: None   Collection Time: 01/28/16  4:33 PM  Result Value Ref Range   Glucose-Capillary 81 65 - 99 mg/dL  Basic metabolic panel     Status: Abnormal   Collection Time: 01/29/16  4:48 AM  Result Value Ref Range   Sodium 145 135 - 145 mmol/L   Potassium 3.8 3.5 - 5.1 mmol/L   Chloride 116 (H) 101 - 111 mmol/L   CO2 26 22 - 32 mmol/L   Glucose, Bld 92 65 - 99 mg/dL   BUN 6 6 - 20 mg/dL   Creatinine, Ser 0.59 0.44 - 1.00 mg/dL   Calcium 8.2 (L) 8.9 - 10.3 mg/dL   GFR calc non Af Amer >60 >60 mL/min   GFR calc Af Amer >60 >60 mL/min    Comment: (NOTE) The eGFR has been calculated using the CKD EPI equation. This calculation has not been validated in all clinical situations. eGFR's persistently <60 mL/min signify possible Chronic Kidney Disease.    Anion  gap 3 (L) 5 - 15  CBC     Status: Abnormal   Collection Time: 01/29/16  4:48 AM  Result Value Ref Range   WBC 7.8 4.0 - 10.5 K/uL   RBC 4.05 3.87 - 5.11 MIL/uL   Hemoglobin 9.2 (L) 12.0 - 15.0 g/dL   HCT 29.2 (L) 36.0 - 46.0 %   MCV 72.1 (L) 78.0 - 100.0 fL   MCH 22.7 (L) 26.0 - 34.0 pg   MCHC 31.5 30.0 - 36.0 g/dL   RDW 19.1 (H) 11.5 - 15.5 %   Platelets 305 150 - 400 K/uL  Hemoglobin A1c     Status: Abnormal   Collection Time: 01/29/16  4:48 AM  Result Value Ref Range   Hgb A1c MFr Bld 6.4 (H) 4.8 - 5.6 %    Comment: (NOTE)         Pre-diabetes: 5.7 - 6.4         Diabetes: >6.4         Glycemic control for adults with diabetes: <7.0    Mean Plasma Glucose 137 mg/dL    Comment: (NOTE) Performed At: [facility] [address] [place], [place] [id number] Lindon Romp MD YT:[id number]   Glucose, capillary     Status: None   Collection Time: 01/29/16  7:21 AM  Result Value Ref  Range   Glucose-Capillary 89 65 - 99 mg/dL  Glucose, capillary     Status: None   Collection Time: 01/29/16 11:31 AM  Result Value Ref Range   Glucose-Capillary 95 65 - 99 mg/dL  Glucose, capillary     Status: Abnormal   Collection Time: 01/29/16  3:23 PM  Result Value Ref Range   Glucose-Capillary 153 (H) 65 - 99 mg/dL  Glucose, capillary     Status: Abnormal   Collection Time: 01/29/16  4:16 PM  Result Value Ref Range   Glucose-Capillary 186 (H) 65 - 99 mg/dL   Comment 1 Notify RN    Comment 2 Document in Chart   Glucose, capillary     Status: Abnormal   Collection Time: 01/29/16  8:21 PM  Result Value Ref Range   Glucose-Capillary 164 (H) 65 - 99 mg/dL   Comment 1 Notify RN    Comment 2 Document in Chart   Basic metabolic panel     Status: Abnormal   Collection Time: 01/30/16  4:52 AM  Result Value Ref Range   Sodium 137 135 - 145 mmol/L    Comment: DELTA CHECK NOTED   Potassium 3.7 3.5 - 5.1 mmol/L   Chloride 106 101 - 111 mmol/L   CO2 25 22 - 32 mmol/L   Glucose, Bld  158 (H) 65 - 99 mg/dL   BUN 5 (L) 6 - 20 mg/dL   Creatinine, Ser 0.64 0.44 - 1.00 mg/dL   Calcium 8.3 (L) 8.9 - 10.3 mg/dL   GFR calc non Af Amer >60 >60 mL/min   GFR calc Af Amer >60 >60 mL/min    Comment: (NOTE) The eGFR has been calculated using the CKD EPI equation. This calculation has not been validated in all clinical situations. eGFR's persistently <60 mL/min signify possible Chronic Kidney Disease.    Anion gap 6 5 - 15  Magnesium     Status: Abnormal   Collection Time: 01/30/16  4:52 AM  Result Value Ref Range   Magnesium 1.5 (L) 1.7 - 2.4 mg/dL  Phosphorus     Status: None   Collection Time: 01/30/16  4:52 AM  Result Value Ref Range   Phosphorus 3.7 2.5 - 4.6 mg/dL  CBC     Status: Abnormal   Collection Time: 01/30/16  4:52 AM  Result Value Ref Range   WBC 10.1 4.0 - 10.5 K/uL   RBC 4.70 3.87 - 5.11 MIL/uL   Hemoglobin 10.9 (L) 12.0 - 15.0 g/dL   HCT 33.8 (L) 36.0 - 46.0 %   MCV 71.9 (L) 78.0 - 100.0 fL   MCH 23.2 (L) 26.0 - 34.0 pg   MCHC 32.2 30.0 - 36.0 g/dL   RDW 19.5 (H) 11.5 - 15.5 %   Platelets 331 150 - 400 K/uL  Glucose, capillary     Status: Abnormal   Collection Time: 01/30/16  7:39 AM  Result Value Ref Range   Glucose-Capillary 166 (H) 65 - 99 mg/dL    ABGS No results for input(s): PHART, PO2ART, TCO2, HCO3 in the last 72 hours.  Invalid input(s): PCO2 CULTURES Recent Results (from the past 240 hour(s))  MRSA PCR Screening     Status: None   Collection Time: 01/22/16 12:42 PM  Result Value Ref Range Status   MRSA by PCR NEGATIVE NEGATIVE Final    Comment:        The GeneXpert MRSA Assay (FDA approved for NASAL specimens only), is one component of a comprehensive MRSA colonization  surveillance program. It is not intended to diagnose MRSA infection nor to guide or monitor treatment for MRSA infections.   Culture, Urine     Status: Abnormal (Preliminary result)   Collection Time: 01/27/16  5:56 AM  Result Value Ref Range Status    Specimen Description URINE, CLEAN CATCH  Final   Special Requests NONE  Final   Culture >=100,000 COLONIES/mL ESCHERICHIA COLI (A)  Final   Report Status PENDING  Incomplete  Surgical PCR screen     Status: Abnormal   Collection Time: 01/27/16 11:50 AM  Result Value Ref Range Status   MRSA, PCR NEGATIVE NEGATIVE Final   Staphylococcus aureus POSITIVE (A) NEGATIVE Final    Comment:        The Xpert SA Assay (FDA approved for NASAL specimens in patients over 13 years of age), is one component of a comprehensive surveillance program.  Test performance has been validated by [facility] for patients greater than or equal to 20 year old. It is not intended to diagnose infection nor to guide or monitor treatment. RESULT CALLED TO, READ BACK BY AND VERIFIED WITH:  MCGIBBONY,C @ 1847 ON 01/27/16 BY [place] Planas    Studies/Results: Dg Chest Port 1 View  01/29/2016  CLINICAL DATA:  Status post partial colectomy and porta catheter insertion. EXAM: PORTABLE CHEST 1 VIEW COMPARISON:  01/26/2016 FINDINGS: Large pneumoperitoneum, Porta catheter from the left with tip at the upper cavoatrial junction. No evidence of pneumothorax. Stable heart size allowing for accentuation by low volumes. Mild postoperative atelectasis mainly noted about the left hilum. IMPRESSION: 1. Unremarkable porta catheter with tip at the upper cavoatrial junction. No evidence of pneumothorax. 2. Low volume chest with atelectasis. 3. Abdominal surgery today with large pneumoperitoneum. Electronically Signed   By: Monte Fantasia M.D.   On: 01/29/2016 15:25   Dg C-arm 1-60 Min-no Report  01/29/2016  CLINICAL DATA: portacath insertion C-ARM 1-60 MINUTES Fluoroscopy was utilized by the requesting physician.  No radiographic interpretation.    Medications: I have reviewed the patient's current medications.  Assesment:   Active Problems:   Diabetes mellitus (HCC)   Schizophrenia (HCC)   Iron deficiency anemia   HLD  (hyperlipidemia)   GERD (gastroesophageal reflux disease)   Colon tumor   Colonic mass   S/P partial colectomy    Plan:  Medications reviewed As perr surgery plan  LOS: 5 days   Mary Wells 01/30/2016, 8:01 AM

## 2016-01-30 NOTE — Clinical Social Work Note (Signed)
Discussed pt's feelings regarding surgery and provided support. Facility anticipates return when d/c from hospital. CSW requested PT evaluation to ensure appropriate level of care.  Benay Pike, [place]

## 2016-01-30 NOTE — Evaluation (Signed)
Physical Therapy Evaluation Patient Details Name: Mary Wells MRN: [id number] DOB: 08-30-1955 Today's Date: 01/30/2016   History of Present Illness  60 yo F admitted 01/22/2016 for a monitored colonoscopy prep due to iron deficiency anemia.  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  She is now s/p Partial colectomy with resection of terminal ileum, liver biopsy on 01/29/2016. PMH: schizophrenia, GERD, DM, iron deficiency anemia.  Clinical Impression  Pt received in bed, and was agreeable to PT evaluation.  Pt lives at an ALF at baseline.  She is independent with ADL's, and ambulation.  Pt was able to ambulate 151ft with no AD and supervision with no LOB.  Pt is at baseline level of mobility, therefore, no further skilled PT warrented, will sign off.     Follow Up Recommendations No PT follow up    Equipment Recommendations  None recommended by PT    Recommendations for Other Services       Precautions / Restrictions Precautions Precaution Comments: Abdominal precautions.       Mobility  Bed Mobility Overal bed mobility: Modified Independent             General bed mobility comments: vc's for technique.   Transfers Overall transfer level: Modified independent Equipment used: None                Ambulation/Gait Ambulation/Gait assistance: Supervision Ambulation Distance (Feet): 100 Feet Assistive device: None Gait Pattern/deviations: WFL(Within Functional Limits)        Stairs            Wheelchair Mobility    Modified Rankin (Stroke Patients Only)       Balance Overall balance assessment: No apparent balance deficits (not formally assessed)                                           Pertinent Vitals/Pain Pain Assessment: 0-10 Pain Score: 5  Pain Location: Abdomen Pain Descriptors / Indicators: Aching Pain Intervention(s): Limited activity within patient's  tolerance    Home Living Family/patient expects to be discharged to:: Assisted living   Available Help at Discharge: Available 24 hours/day         Home Layout: One level        Prior Function     Gait / Transfers Assistance Needed: Pt states she ambulates independenty  ADL's / Homemaking Assistance Needed: Pt is independent with dressing and bathing.         Hand Dominance   Dominant Hand: Right    Extremity/Trunk Assessment   Upper Extremity Assessment: Overall WFL for tasks assessed           Lower Extremity Assessment: Overall WFL for tasks assessed         Communication   Communication:  (delayed response)  Cognition Arousal/Alertness: Awake/alert Behavior During Therapy: WFL for tasks assessed/performed Overall Cognitive Status: Impaired/Different from baseline Area of Impairment: Orientation Orientation Level: Disoriented to;Situation                  General Comments      Exercises        Assessment/Plan    PT Assessment Patent does not need any further PT services  PT Diagnosis Abnormality of gait;Difficulty walking;Generalized weakness   PT Problem List    PT Treatment Interventions  PT Goals (Current goals can be found in the Care Plan section) Acute Rehab PT Goals PT Goal Formulation: All assessment and education complete, DC therapy    Frequency     Barriers to discharge        Co-evaluation               End of Session Equipment Utilized During Treatment: Gait belt Activity Tolerance: Patient tolerated treatment well Patient left: in chair;with call bell/phone within reach      Functional Assessment Tool Used: [place] "6-clicks"  Functional Limitation: Mobility: Walking and moving around Mobility: Walking and Moving Around Current Status [phone]): At least 1 percent but less than 20 percent impaired, limited or restricted Mobility: Walking and Moving Around Goal Status [phone]): At least  1 percent but less than 20 percent impaired, limited or restricted Mobility: Walking and Moving Around Discharge Status [phone]): At least 1 percent but less than 20 percent impaired, limited or restricted    Time: 1434-1457 PT Time Calculation (min) (ACUTE ONLY): 23 min   Charges:   PT Evaluation $PT Eval Low Complexity: 1 Procedure PT Treatments $Gait Training: 8-22 mins   PT G Codes:   PT G-Codes **NOT FOR INPATIENT CLASS** Functional Assessment Tool Used: [employer] "6-clicks"  Functional Limitation: Mobility: Walking and moving around Mobility: Walking and Moving Around Current Status [phone]): At least 1 percent but less than 20 percent impaired, limited or restricted Mobility: Walking and Moving Around Goal Status [phone]): At least 1 percent but less than 20 percent impaired, limited or restricted Mobility: Walking and Moving Around Discharge Status [phone]): At least 1 percent but less than 20 percent impaired, limited or restricted    Beth Macari Zalesky, PT, DPT X: [phone]

## 2016-01-31 LAB — BASIC METABOLIC PANEL
Anion gap: 4 — ABNORMAL LOW (ref 5–15)
BUN: 5 mg/dL — AB (ref 6–20)
CALCIUM: 8.3 mg/dL — AB (ref 8.9–10.3)
CO2: 27 mmol/L (ref 22–32)
Chloride: 109 mmol/L (ref 101–111)
Creatinine, Ser: 0.59 mg/dL (ref 0.44–1.00)
GFR calc Af Amer: >60 mL/min (ref >60)
GLUCOSE: 161 mg/dL — AB (ref 65–99)
Potassium: 3.6 mmol/L (ref 3.5–5.1)
Sodium: 140 mmol/L (ref 135–145)

## 2016-01-31 LAB — CBC
HCT: 29.8 % — ABNORMAL LOW (ref 36.0–46.0)
Hemoglobin: 9.6 g/dL — ABNORMAL LOW (ref 12.0–15.0)
MCH: 23.2 pg — ABNORMAL LOW (ref 26.0–34.0)
MCHC: 32.2 g/dL (ref 30.0–36.0)
MCV: 72 fL — ABNORMAL LOW (ref 78.0–100.0)
Platelets: 301 10*3/uL (ref 150–400)
RBC: 4.14 MIL/uL (ref 3.87–5.11)
RDW: 20 % — ABNORMAL HIGH (ref 11.5–15.5)
WBC: 12.2 10*3/uL — ABNORMAL HIGH (ref 4.0–10.5)

## 2016-01-31 LAB — PHOSPHORUS: PHOSPHORUS: 3 mg/dL (ref 2.5–4.6)

## 2016-01-31 LAB — GLUCOSE, CAPILLARY
GLUCOSE-CAPILLARY: 123 mg/dL — AB (ref 65–99)
GLUCOSE-CAPILLARY: 101 mg/dL — AB (ref 65–99)
Glucose-Capillary: 88 mg/dL (ref 65–99)
Glucose-Capillary: 150 mg/dL — ABNORMAL HIGH (ref 65–99)

## 2016-01-31 LAB — MAGNESIUM: MAGNESIUM: 1.9 mg/dL (ref 1.7–2.4)

## 2016-01-31 NOTE — Progress Notes (Signed)
2 Days Post-Op  Subjective: Patient denies any incisional pain. Tolerating clear liquid diet well.  Objective: Vital signs in last 24 hours: Temp:  [97.3 F (36.3 C)-98.2 F (36.8 C)] 98.1 F (36.7 C) (06/28 0440) Pulse Rate:  [78-91] 78 (06/28 0440) Resp:  [20] 20 (06/28 0440) BP: (122-132)/(63-71) 132/71 mmHg (06/28 0440) SpO2:  [100 %] 100 % (06/28 0440) Last BM Date: 01/29/16  Intake/Output from previous day: 06/27 0701 - 06/28 0700 In: 2367.9 [P.O.:420; I.V.:1947.9] Out: 2200 [Urine:2200] Intake/Output this shift: Total I/O In: 240 [P.O.:240] Out: 600 [Urine:600]  General appearance: alert, cooperative and no distress Resp: clear to auscultation bilaterally Cardio: regular rate and rhythm, S1, S2 normal, no murmur, click, rub or gallop GI: Soft, incision healing well. Minimal bowel sounds appreciated.  Lab Results:   Recent Labs  01/30/16 0452 01/31/16 0418  WBC 10.1 12.2*  HGB 10.9* 9.6*  HCT 33.8* 29.8*  PLT 331 301   BMET  Recent Labs  01/30/16 0452 01/31/16 0418  NA 137 140  K 3.7 3.6  CL 106 109  CO2 25 27  GLUCOSE 158* 161*  BUN 5* 5*  CREATININE 0.64 0.59  CALCIUM 8.3* 8.3*   PT/INR No results for input(s): LABPROT, INR in the last 72 hours.  Studies/Results: Dg Chest Port 1 View  01/29/2016  CLINICAL DATA:  Status post partial colectomy and porta catheter insertion. EXAM: PORTABLE CHEST 1 VIEW COMPARISON:  01/26/2016 FINDINGS: Large pneumoperitoneum, Porta catheter from the left with tip at the upper cavoatrial junction. No evidence of pneumothorax. Stable heart size allowing for accentuation by low volumes. Mild postoperative atelectasis mainly noted about the left hilum. IMPRESSION: 1. Unremarkable porta catheter with tip at the upper cavoatrial junction. No evidence of pneumothorax. 2. Low volume chest with atelectasis. 3. Abdominal surgery today with large pneumoperitoneum. Electronically Signed   By: Monte Fantasia M.D.   On: 01/29/2016  15:25   Dg C-arm 1-60 Min-no Report  01/29/2016  CLINICAL DATA: portacath insertion C-ARM 1-60 MINUTES Fluoroscopy was utilized by the requesting physician.  No radiographic interpretation.    Anti-infectives: Anti-infectives    Start     Dose/Rate Route Frequency Ordered Stop   01/29/16 1143  cefoTEtan in Dextrose 5% (CEFOTAN) IVPB 2 g     2 g Intravenous On call to O.R. 01/29/16 1143 01/29/16 1255      Assessment/Plan: s/p Procedure(s): PARTIAL COLECTOMY  INSERTION PORT-A-CATH LIVER BIOPSY Impression: Stable on postoperative day 2. We'll advance to full liquid diet. Continue ambulating.  LOS: 6 days    Hiawatha Dressel A 01/31/2016

## 2016-01-31 NOTE — Clinical Documentation Improvement (Signed)
Hospitalist  Abnormal Lab/Test Results:  Urine - CULTURE:>=100,000 COLONIES/mL ESCHERICHIA COLI  REPORT STATUS:FINAL 01/30/2016    Possible Clinical Conditions associated with below indicators  UTI from ecoli  Other Condition  Cannot Clinically Determine     Please exercise your independent, professional judgment when responding. A specific answer is not anticipated or expected.   Thank You,  [place] [phone]

## 2016-01-31 NOTE — Progress Notes (Signed)
Subjective: Patient is resting. She feels better. She tolerating liquid diet. Patient has not moved her bowel but no nausea or vomiting. She was able to ambulate. No new complaint. Objective Vital signs in last 24 hours: Temp:  [97.3 F (36.3 C)-98.2 F (36.8 C)] 98.1 F (36.7 C) (06/28 0440) Pulse Rate:  [78-91] 78 (06/28 0440) Resp:  [20] 20 (06/28 0440) BP: (122-132)/(63-71) 132/71 mmHg (06/28 0440) SpO2:  [100 %] 100 % (06/28 0440) Weight change:  Last BM Date: 01/29/16  Intake/Output from previous day: 06/27 0701 - 06/28 0700 In: 2367.9 [P.O.:420; I.V.:1947.9] Out: 2200 [Urine:2200]  PHYSICAL EXAM General appearance: alert and no distress Resp: clear to auscultation bilaterally Cardio: S1, S2 normal GI: incision site clean, no tendernes, bowel sound ++ Extremities: extremities normal, atraumatic, no cyanosis or edema  Lab Results:  Results for orders placed or performed during the hospital encounter of 01/22/16 (from the past 48 hour(s))  Glucose, capillary     Status: None   Collection Time: 01/29/16 11:31 AM  Result Value Ref Range   Glucose-Capillary 95 65 - 99 mg/dL  Glucose, capillary     Status: Abnormal   Collection Time: 01/29/16  3:23 PM  Result Value Ref Range   Glucose-Capillary 153 (H) 65 - 99 mg/dL  Glucose, capillary     Status: Abnormal   Collection Time: 01/29/16  4:16 PM  Result Value Ref Range   Glucose-Capillary 186 (H) 65 - 99 mg/dL   Comment 1 Notify RN    Comment 2 Document in Chart   Glucose, capillary     Status: Abnormal   Collection Time: 01/29/16  8:21 PM  Result Value Ref Range   Glucose-Capillary 164 (H) 65 - 99 mg/dL   Comment 1 Notify RN    Comment 2 Document in Chart   Basic metabolic panel     Status: Abnormal   Collection Time: 01/30/16  4:52 AM  Result Value Ref Range   Sodium 137 135 - 145 mmol/L    Comment: DELTA CHECK NOTED   Potassium 3.7 3.5 - 5.1 mmol/L   Chloride 106 101 - 111 mmol/L   CO2 25 22 - 32 mmol/L   Glucose, Bld 158 (H) 65 - 99 mg/dL   BUN 5 (L) 6 - 20 mg/dL   Creatinine, Ser 0.64 0.44 - 1.00 mg/dL   Calcium 8.3 (L) 8.9 - 10.3 mg/dL   GFR calc non Af Amer >60 >60 mL/min   GFR calc Af Amer >60 >60 mL/min    Comment: (NOTE) The eGFR has been calculated using the CKD EPI equation. This calculation has not been validated in all clinical situations. eGFR's persistently <60 mL/min signify possible Chronic Kidney Disease.    Anion gap 6 5 - 15  Magnesium     Status: Abnormal   Collection Time: 01/30/16  4:52 AM  Result Value Ref Range   Magnesium 1.5 (L) 1.7 - 2.4 mg/dL  Phosphorus     Status: None   Collection Time: 01/30/16  4:52 AM  Result Value Ref Range   Phosphorus 3.7 2.5 - 4.6 mg/dL  CBC     Status: Abnormal   Collection Time: 01/30/16  4:52 AM  Result Value Ref Range   WBC 10.1 4.0 - 10.5 K/uL   RBC 4.70 3.87 - 5.11 MIL/uL   Hemoglobin 10.9 (L) 12.0 - 15.0 g/dL   HCT 33.8 (L) 36.0 - 46.0 %   MCV 71.9 (L) 78.0 - 100.0 fL   MCH 23.2 (L)  26.0 - 34.0 pg   MCHC 32.2 30.0 - 36.0 g/dL   RDW 19.5 (H) 11.5 - 15.5 %   Platelets 331 150 - 400 K/uL  Glucose, capillary     Status: Abnormal   Collection Time: 01/30/16  7:39 AM  Result Value Ref Range   Glucose-Capillary 166 (H) 65 - 99 mg/dL  Glucose, capillary     Status: Abnormal   Collection Time: 01/30/16 11:20 AM  Result Value Ref Range   Glucose-Capillary 117 (H) 65 - 99 mg/dL  Glucose, capillary     Status: Abnormal   Collection Time: 01/30/16  4:22 PM  Result Value Ref Range   Glucose-Capillary 174 (H) 65 - 99 mg/dL   Comment 1 Notify RN    Comment 2 Document in Chart   Basic metabolic panel     Status: Abnormal   Collection Time: 01/31/16  4:18 AM  Result Value Ref Range   Sodium 140 135 - 145 mmol/L   Potassium 3.6 3.5 - 5.1 mmol/L   Chloride 109 101 - 111 mmol/L   CO2 27 22 - 32 mmol/L   Glucose, Bld 161 (H) 65 - 99 mg/dL   BUN 5 (L) 6 - 20 mg/dL   Creatinine, Ser 0.59 0.44 - 1.00 mg/dL   Calcium 8.3 (L)  8.9 - 10.3 mg/dL   GFR calc non Af Amer >60 >60 mL/min   GFR calc Af Amer >60 >60 mL/min    Comment: (NOTE) The eGFR has been calculated using the CKD EPI equation. This calculation has not been validated in all clinical situations. eGFR's persistently <60 mL/min signify possible Chronic Kidney Disease.    Anion gap 4 (L) 5 - 15  CBC     Status: Abnormal   Collection Time: 01/31/16  4:18 AM  Result Value Ref Range   WBC 12.2 (H) 4.0 - 10.5 K/uL   RBC 4.14 3.87 - 5.11 MIL/uL   Hemoglobin 9.6 (L) 12.0 - 15.0 g/dL   HCT 29.8 (L) 36.0 - 46.0 %   MCV 72.0 (L) 78.0 - 100.0 fL   MCH 23.2 (L) 26.0 - 34.0 pg   MCHC 32.2 30.0 - 36.0 g/dL   RDW 20.0 (H) 11.5 - 15.5 %   Platelets 301 150 - 400 K/uL  Phosphorus     Status: None   Collection Time: 01/31/16  4:18 AM  Result Value Ref Range   Phosphorus 3.0 2.5 - 4.6 mg/dL  Glucose, capillary     Status: Abnormal   Collection Time: 01/31/16  7:42 AM  Result Value Ref Range   Glucose-Capillary 123 (H) 65 - 99 mg/dL    ABGS No results for input(s): PHART, PO2ART, TCO2, HCO3 in the last 72 hours.  Invalid input(s): PCO2 CULTURES Recent Results (from the past 240 hour(s))  MRSA PCR Screening     Status: None   Collection Time: 01/22/16 12:42 PM  Result Value Ref Range Status   MRSA by PCR NEGATIVE NEGATIVE Final    Comment:        The GeneXpert MRSA Assay (FDA approved for NASAL specimens only), is one component of a comprehensive MRSA colonization surveillance program. It is not intended to diagnose MRSA infection nor to guide or monitor treatment for MRSA infections.   Culture, Urine     Status: Abnormal   Collection Time: 01/27/16  5:56 AM  Result Value Ref Range Status   Specimen Description URINE, CLEAN CATCH  Final   Special Requests NONE  Final  Culture >=100,000 COLONIES/mL ESCHERICHIA COLI (A)  Final   Report Status 01/30/2016 FINAL  Final   Organism ID, Bacteria ESCHERICHIA COLI (A)  Final      Susceptibility    Escherichia coli - MIC*    AMPICILLIN <=2 SENSITIVE Sensitive     CEFAZOLIN <=4 SENSITIVE Sensitive     CEFTRIAXONE <=1 SENSITIVE Sensitive     CIPROFLOXACIN <=0.25 SENSITIVE Sensitive     GENTAMICIN <=1 SENSITIVE Sensitive     IMIPENEM <=0.25 SENSITIVE Sensitive     NITROFURANTOIN <=16 SENSITIVE Sensitive     TRIMETH/SULFA <=20 SENSITIVE Sensitive     AMPICILLIN/SULBACTAM <=2 SENSITIVE Sensitive     PIP/TAZO <=4 SENSITIVE Sensitive     * >=100,000 COLONIES/mL ESCHERICHIA COLI  Surgical PCR screen     Status: Abnormal   Collection Time: 01/27/16 11:50 AM  Result Value Ref Range Status   MRSA, PCR NEGATIVE NEGATIVE Final   Staphylococcus aureus POSITIVE (A) NEGATIVE Final    Comment:        The Xpert SA Assay (FDA approved for NASAL specimens in patients over 82 years of age), is one component of a comprehensive surveillance program.  Test performance has been validated by [facility] for patients greater than or equal to 30 year old. It is not intended to diagnose infection nor to guide or monitor treatment. RESULT CALLED TO, READ BACK BY AND VERIFIED WITH:  MCGIBBONY,C @ 1847 ON 01/27/16 BY [place] Planas    Studies/Results: Dg Chest Port 1 View  01/29/2016  CLINICAL DATA:  Status post partial colectomy and porta catheter insertion. EXAM: PORTABLE CHEST 1 VIEW COMPARISON:  01/26/2016 FINDINGS: Large pneumoperitoneum, Porta catheter from the left with tip at the upper cavoatrial junction. No evidence of pneumothorax. Stable heart size allowing for accentuation by low volumes. Mild postoperative atelectasis mainly noted about the left hilum. IMPRESSION: 1. Unremarkable porta catheter with tip at the upper cavoatrial junction. No evidence of pneumothorax. 2. Low volume chest with atelectasis. 3. Abdominal surgery today with large pneumoperitoneum. Electronically Signed   By: Monte Fantasia M.D.   On: 01/29/2016 15:25   Dg C-arm 1-60 Min-no Report  01/29/2016  CLINICAL DATA: portacath  insertion C-ARM 1-60 MINUTES Fluoroscopy was utilized by the requesting physician.  No radiographic interpretation.    Medications: I have reviewed the patient's current medications.  Assesment:   Active Problems:   Diabetes mellitus (HCC)   Schizophrenia (HCC)   Iron deficiency anemia   HLD (hyperlipidemia)   GERD (gastroesophageal reflux disease)   Colon tumor   Colonic mass   S/P partial colectomy    Plan:  Medications reviewed As perr surgery plan Continue supportive care  LOS: 6 days   Mary Wells 01/31/2016, 8:05 AM

## 2016-02-01 LAB — GLUCOSE, CAPILLARY
GLUCOSE-CAPILLARY: 116 mg/dL — AB (ref 65–99)
Glucose-Capillary: 131 mg/dL — ABNORMAL HIGH (ref 65–99)
Glucose-Capillary: 128 mg/dL — ABNORMAL HIGH (ref 65–99)
Glucose-Capillary: 133 mg/dL — ABNORMAL HIGH (ref 65–99)

## 2016-02-01 LAB — CBC
HCT: 28 % — ABNORMAL LOW (ref 36.0–46.0)
Hemoglobin: 8.9 g/dL — ABNORMAL LOW (ref 12.0–15.0)
MCH: 23.1 pg — AB (ref 26.0–34.0)
MCHC: 31.8 g/dL (ref 30.0–36.0)
MCV: 72.5 fL — AB (ref 78.0–100.0)
PLATELETS: 265 10*3/uL (ref 150–400)
RBC: 3.86 MIL/uL — ABNORMAL LOW (ref 3.87–5.11)
RDW: 20.6 % — ABNORMAL HIGH (ref 11.5–15.5)
WBC: 9.6 10*3/uL (ref 4.0–10.5)

## 2016-02-01 NOTE — Progress Notes (Signed)
Subjective: Patient is resting. She is tolerating her diet. She is ambulating well. However, she has not moved her bowel. Discussed with Dr. Arnoldo Morale about the patient and he recommended to discharge patient after she start moving her bowel.  Objective Vital signs in last 24 hours: Temp:  [97.4 F (36.3 C)-99.2 F (37.3 C)] 98.4 F (36.9 C) (06/29 0511) Pulse Rate:  [71-95] 71 (06/29 0511) Resp:  [18-20] 18 (06/29 0511) BP: (119-137)/(64-71) 137/71 mmHg (06/29 0511) SpO2:  [97 %-100 %] 100 % (06/29 0511) Weight change:  Last BM Date: 01/29/16  Intake/Output from previous day: 06/28 0701 - 06/29 0700 In: 1820 [P.O.:960; I.V.:860] Out: 4700 [Urine:4700]  PHYSICAL EXAM General appearance: alert and no distress Resp: clear to auscultation bilaterally Cardio: S1, S2 normal GI: incision site clean, no tendernes, bowel sound ++ Extremities: extremities normal, atraumatic, no cyanosis or edema  Lab Results:  Results for orders placed or performed during the hospital encounter of 01/22/16 (from the past 48 hour(s))  Glucose, capillary     Status: Abnormal   Collection Time: 01/30/16 11:20 AM  Result Value Ref Range   Glucose-Capillary 117 (H) 65 - 99 mg/dL  Glucose, capillary     Status: Abnormal   Collection Time: 01/30/16  4:22 PM  Result Value Ref Range   Glucose-Capillary 174 (H) 65 - 99 mg/dL   Comment 1 Notify RN    Comment 2 Document in Chart   Basic metabolic panel     Status: Abnormal   Collection Time: 01/31/16  4:18 AM  Result Value Ref Range   Sodium 140 135 - 145 mmol/L   Potassium 3.6 3.5 - 5.1 mmol/L   Chloride 109 101 - 111 mmol/L   CO2 27 22 - 32 mmol/L   Glucose, Bld 161 (H) 65 - 99 mg/dL   BUN 5 (L) 6 - 20 mg/dL   Creatinine, Ser 0.59 0.44 - 1.00 mg/dL   Calcium 8.3 (L) 8.9 - 10.3 mg/dL   GFR calc non Af Amer >60 >60 mL/min   GFR calc Af Amer >60 >60 mL/min    Comment: (NOTE) The eGFR has been calculated using the CKD EPI equation. This calculation has  not been validated in all clinical situations. eGFR's persistently <60 mL/min signify possible Chronic Kidney Disease.    Anion gap 4 (L) 5 - 15  CBC     Status: Abnormal   Collection Time: 01/31/16  4:18 AM  Result Value Ref Range   WBC 12.2 (H) 4.0 - 10.5 K/uL   RBC 4.14 3.87 - 5.11 MIL/uL   Hemoglobin 9.6 (L) 12.0 - 15.0 g/dL   HCT 29.8 (L) 36.0 - 46.0 %   MCV 72.0 (L) 78.0 - 100.0 fL   MCH 23.2 (L) 26.0 - 34.0 pg   MCHC 32.2 30.0 - 36.0 g/dL   RDW 20.0 (H) 11.5 - 15.5 %   Platelets 301 150 - 400 K/uL  Phosphorus     Status: None   Collection Time: 01/31/16  4:18 AM  Result Value Ref Range   Phosphorus 3.0 2.5 - 4.6 mg/dL  Magnesium     Status: None   Collection Time: 01/31/16  4:18 AM  Result Value Ref Range   Magnesium 1.9 1.7 - 2.4 mg/dL  Glucose, capillary     Status: Abnormal   Collection Time: 01/31/16  7:42 AM  Result Value Ref Range   Glucose-Capillary 123 (H) 65 - 99 mg/dL  Glucose, capillary     Status: None  Collection Time: 01/31/16 11:36 AM  Result Value Ref Range   Glucose-Capillary 88 65 - 99 mg/dL   Comment 1 Notify RN    Comment 2 Document in Chart   Glucose, capillary     Status: Abnormal   Collection Time: 01/31/16  4:40 PM  Result Value Ref Range   Glucose-Capillary 150 (H) 65 - 99 mg/dL   Comment 1 Notify RN    Comment 2 Document in Chart   Glucose, capillary     Status: Abnormal   Collection Time: 01/31/16  8:47 PM  Result Value Ref Range   Glucose-Capillary 101 (H) 65 - 99 mg/dL   Comment 1 Notify RN    Comment 2 Document in Chart   CBC     Status: Abnormal   Collection Time: 02/01/16  4:31 AM  Result Value Ref Range   WBC 9.6 4.0 - 10.5 K/uL   RBC 3.86 (L) 3.87 - 5.11 MIL/uL   Hemoglobin 8.9 (L) 12.0 - 15.0 g/dL   HCT 28.0 (L) 36.0 - 46.0 %   MCV 72.5 (L) 78.0 - 100.0 fL   MCH 23.1 (L) 26.0 - 34.0 pg   MCHC 31.8 30.0 - 36.0 g/dL   RDW 20.6 (H) 11.5 - 15.5 %   Platelets 265 150 - 400 K/uL  Glucose, capillary     Status: Abnormal    Collection Time: 02/01/16  7:20 AM  Result Value Ref Range   Glucose-Capillary 116 (H) 65 - 99 mg/dL    ABGS No results for input(s): PHART, PO2ART, TCO2, HCO3 in the last 72 hours.  Invalid input(s): PCO2 CULTURES Recent Results (from the past 240 hour(s))  MRSA PCR Screening     Status: None   Collection Time: 01/22/16 12:42 PM  Result Value Ref Range Status   MRSA by PCR NEGATIVE NEGATIVE Final    Comment:        The GeneXpert MRSA Assay (FDA approved for NASAL specimens only), is one component of a comprehensive MRSA colonization surveillance program. It is not intended to diagnose MRSA infection nor to guide or monitor treatment for MRSA infections.   Culture, Urine     Status: Abnormal   Collection Time: 01/27/16  5:56 AM  Result Value Ref Range Status   Specimen Description URINE, CLEAN CATCH  Final   Special Requests NONE  Final   Culture >=100,000 COLONIES/mL ESCHERICHIA COLI (A)  Final   Report Status 01/30/2016 FINAL  Final   Organism ID, Bacteria ESCHERICHIA COLI (A)  Final      Susceptibility   Escherichia coli - MIC*    AMPICILLIN <=2 SENSITIVE Sensitive     CEFAZOLIN <=4 SENSITIVE Sensitive     CEFTRIAXONE <=1 SENSITIVE Sensitive     CIPROFLOXACIN <=0.25 SENSITIVE Sensitive     GENTAMICIN <=1 SENSITIVE Sensitive     IMIPENEM <=0.25 SENSITIVE Sensitive     NITROFURANTOIN <=16 SENSITIVE Sensitive     TRIMETH/SULFA <=20 SENSITIVE Sensitive     AMPICILLIN/SULBACTAM <=2 SENSITIVE Sensitive     PIP/TAZO <=4 SENSITIVE Sensitive     * >=100,000 COLONIES/mL ESCHERICHIA COLI  Surgical PCR screen     Status: Abnormal   Collection Time: 01/27/16 11:50 AM  Result Value Ref Range Status   MRSA, PCR NEGATIVE NEGATIVE Final   Staphylococcus aureus POSITIVE (A) NEGATIVE Final    Comment:        The Xpert SA Assay (FDA approved for NASAL specimens in patients over 53 years of age), is one component  of a comprehensive surveillance program.  Test performance  has been validated by [facility] for patients greater than or equal to 86 year old. It is not intended to diagnose infection nor to guide or monitor treatment. RESULT CALLED TO, READ BACK BY AND VERIFIED WITH:  MCGIBBONY,C @ 1847 ON 01/27/16 BY WOODIE,J    Studies/Results: No results found.  Medications: I have reviewed the patient's current medications.  Assesment:   Active Problems:   Diabetes mellitus (HCC)   Schizophrenia (HCC)   Iron deficiency anemia   HLD (hyperlipidemia)   GERD (gastroesophageal reflux disease)   Colon tumor   Colonic mass   S/P partial colectomy    Plan:  Medications reviewed As perr surgery plan Continue supportive care  LOS: 7 days   Chauntae Hults 02/01/2016, 8:14 AM

## 2016-02-01 NOTE — Progress Notes (Signed)
3 Days Post-Op  Subjective: Patient has no complaints. No nausea or vomiting are noted. Has not had a bowel movement yet.  Objective: Vital signs in last 24 hours: Temp:  [97.4 F (36.3 C)-99.2 F (37.3 C)] 98.4 F (36.9 C) (06/29 0511) Pulse Rate:  [71-95] 71 (06/29 0511) Resp:  [18-20] 18 (06/29 0511) BP: (119-137)/(64-71) 137/71 mmHg (06/29 0511) SpO2:  [97 %-100 %] 100 % (06/29 0511) Last BM Date: 01/29/16  Intake/Output from previous day: 06/28 0701 - 06/29 0700 In: 1820 [P.O.:960; I.V.:860] Out: 4700 [Urine:4700] Intake/Output this shift: Total I/O In: -  Out: 400 [Urine:400]  General appearance: alert, cooperative and no distress Resp: clear to auscultation bilaterally Cardio: regular rate and rhythm, S1, S2 normal, no murmur, click, rub or gallop GI: Soft, incision healing well. Minimal bowel sounds appreciated. Nondistended.  Lab Results:   Recent Labs  01/31/16 0418 02/01/16 0431  WBC 12.2* 9.6  HGB 9.6* 8.9*  HCT 29.8* 28.0*  PLT 301 265   BMET  Recent Labs  01/30/16 0452 01/31/16 0418  NA 137 140  K 3.7 3.6  CL 106 109  CO2 25 27  GLUCOSE 158* 161*  BUN 5* 5*  CREATININE 0.64 0.59  CALCIUM 8.3* 8.3*   PT/INR No results for input(s): LABPROT, INR in the last 72 hours.  Studies/Results: No results found.  Anti-infectives: Anti-infectives    Start     Dose/Rate Route Frequency Ordered Stop   01/29/16 1143  cefoTEtan in Dextrose 5% (CEFOTAN) IVPB 2 g     2 g Intravenous On call to O.R. 01/29/16 1143 01/29/16 1255      Assessment/Plan: s/p Procedure(s): PARTIAL COLECTOMY  INSERTION PORT-A-CATH LIVER BIOPSY Impression: Stable on postoperative day 3. Awaiting full return of bowel function. Final pathology pending. Will ambulate patient.  Patient had urine culture which was positive for Escherichia coli, question contaminated specimen as patient denies any dysuria.  LOS: 7 days    [name] 02/01/2016

## 2016-02-02 ENCOUNTER — Other Ambulatory Visit (HOSPITAL_COMMUNITY): Payer: Self-pay | Admitting: Oncology

## 2016-02-02 ENCOUNTER — Encounter (HOSPITAL_COMMUNITY): Payer: Self-pay | Admitting: Oncology

## 2016-02-02 LAB — GLUCOSE, CAPILLARY: GLUCOSE-CAPILLARY: 99 mg/dL (ref 65–99)

## 2016-02-02 MED ORDER — HYDROCODONE-ACETAMINOPHEN 5-325 MG PO TABS: 1.0000 | ORAL_TABLET | ORAL | Status: DC | PRN

## 2016-02-02 NOTE — Clinical Social Work Note (Signed)
Pt d/c today back to [facility]. Pt, pt's aunt Oleta Mouse, and facility aware and agreeable. Will transfer via facility vehicle.   Benay Pike, [place]

## 2016-02-02 NOTE — OR Nursing (Signed)
Patient free of signs of injury from positioning from procedure 01/29/16.

## 2016-02-02 NOTE — Care Management Note (Signed)
Case Management Note  Patient Details  Name: Mary Wells MRN: [id number] Date of Birth: May 12, 1956    Expected Discharge Date:        02/02/2016          Expected Discharge Plan:  [facility]  In-House Referral:     Discharge planning Services  CM Consult  Post Acute Care Choice:  NA Choice offered to:  NA  DME Arranged:    DME Agency:     HH Arranged:    [place] Agency:     Status of Service:  Completed, signed off  If discussed at [employer] of Stay Meetings, dates discussed:    Additional Comments: Patient d/c today back to [facility]. CSW aware and making arrangements.  Mande Auvil, Chauncey Reading, RN 02/02/2016, 11:48 AM

## 2016-02-02 NOTE — Discharge Summary (Signed)
  Addendum.  Patient has been prescribed Norco 1 tablet po q4hrs prn pain.

## 2016-02-02 NOTE — Discharge Summary (Signed)
Physician Discharge Summary  Patient ID: Mary Wells MRN: [id number] DOB/AGE: 02/16/1956 60 y.o. Primary Care Physician:Kassity Woodson, MD Admit date: 01/22/2016 Discharge date: 02/02/2016    Discharge Diagnoses:   Active Problems:   Diabetes mellitus (HCC)   Schizophrenia ([place])   Iron deficiency anemia   HLD (hyperlipidemia)   GERD (gastroesophageal reflux disease)   Adenocarcinoma of colon (HCC)   Colonic mass S/P partial colectomy    Medication List    TAKE these medications        benztropine 2 MG tablet  Commonly known as:  COGENTIN  Take 1 mg by mouth 2 (two) times daily.     clonazePAM 0.5 MG tablet  Commonly known as:  KLONOPIN  Take 0.5 mg by mouth 2 (two) times daily as needed for anxiety (prn agitation).     DEPAKOTE ER 250 MG 24 hr tablet  Generic drug:  divalproex  Take 250 mg by mouth at bedtime.     docusate sodium 100 MG capsule  Commonly known as:  COLACE  Take 100 mg by mouth daily as needed for mild constipation.     famotidine 20 MG tablet  Commonly known as:  PEPCID  Take 20 mg by mouth daily.     ferrous sulfate 325 (65 FE) MG tablet  Take 325 mg by mouth 2 (two) times daily with a meal.     fluPHENAZine 5 MG tablet  Commonly known as:  PROLIXIN  Take 5 mg by mouth 2 (two) times daily.     loratadine 10 MG tablet  Commonly known as:  CLARITIN  Take 10 mg by mouth daily.     metFORMIN 500 MG tablet  Commonly known as:  GLUCOPHAGE  Take 500 mg by mouth 2 (two) times daily with a meal.     NON FORMULARY  Take 1 capsule by mouth 2 (two) times daily. Fiber capsules 520 mg.     simvastatin 20 MG tablet  Commonly known as:  ZOCOR  Take 20 mg by mouth daily.        Discharged Condition: improved     Consults: GI/ general surgery/ oncology  Significant Diagnostic Studies: Dg Chest 2 View  01/26/2016  CLINICAL DATA:  Colon cancer EXAM: CHEST  2 VIEW COMPARISON:  None. FINDINGS: 11 mm right upper lobe pulmonary nodule is compatible  with the lesions seen on CT scan earlier today. No focal airspace consolidation or pulmonary edema. No pleural effusion. Cardiopericardial silhouette is at upper limits of normal for size. Atelectasis or scarring noted at the left lung base. The visualized bony structures of the thorax are intact. IMPRESSION: Right upper lobe pulmonary nodule. Electronically Signed   By: Misty Stanley M.D.   On: 01/26/2016 08:21   Ct Chest W Contrast  01/26/2016  CLINICAL DATA:  Newly diagnosed colon carcinoma.  Staging. EXAM: CT CHEST WITH CONTRAST TECHNIQUE: Multidetector CT imaging of the chest was performed during intravenous contrast administration. CONTRAST:  48mL ISOVUE-300 IOPAMIDOL (ISOVUE-300) INJECTION 61% COMPARISON:  None. FINDINGS: Mediastinum/Lymph Nodes: No masses, pathologically enlarged lymph nodes, or other acute abnormality. Aortic atherosclerosis noted. Coronary artery calcification also noted. Mild cardiomegaly demonstrated. Lungs/Pleura: tiny bilateral pleural effusions are noted with mild bibasilar atelectasis or scarring. A 9 mm mean diameter pulmonary nodule seen in the posterior right upper lobe on image 42/series 3 which is noncalcified and smoothly marginated. No other pulmonary nodules or masses are identified. Upper abdomen: See separate abdomen pelvis CT report from 01/25/2016. Musculoskeletal: No chest wall mass  or suspicious bone lesions identified. IMPRESSION: 9 mm pulmonary nodule in posterior right upper lobe. Solitary pulmonary metastasis or bronchogenic carcinoma cannot be excluded. Consider PET-CT scan for further evaluation. No lymphadenopathy or other sites of metastatic disease identified within the thorax. Tiny bilateral pleural effusions and mild bibasilar atelectasis or scarring. Aortic atherosclerosis noted.  Coronary artery calcification noted. Electronically Signed   By: Earle Gell M.D.   On: 01/26/2016 08:21   Ct Abdomen Pelvis W Contrast  01/25/2016  CLINICAL DATA:  Patient  with history of large colonic mass. EXAM: CT ABDOMEN AND PELVIS WITH CONTRAST TECHNIQUE: Multidetector CT imaging of the abdomen and pelvis was performed using the standard protocol following bolus administration of intravenous contrast. CONTRAST:  137mL ISOVUE-300 IOPAMIDOL (ISOVUE-300) INJECTION 61% COMPARISON:  None. FINDINGS: Lower chest: Heart is enlarged. Trace pericardial effusion. Small bilateral pleural effusions. Lower lobe ground-glass consolidative opacities suggestive of atelectasis. Hepatobiliary: There is an 11 mm low-attenuation lesion within the inferior right hepatic lobe (image 40; series 2). There is an additional 6 mm low-attenuation lesion within the right hepatic lobe (image 22; series 2). There is a 7 mm low-attenuation lesion within the hepatic dome (image 7; series 2). Small amount of stones within the gallbladder lumen. Gallbladder is decompressed. Pancreas: Unremarkable Spleen: Unremarkable Adrenals/Urinary Tract: The adrenal glands are normal. Kidneys enhance symmetrically with contrast. Multiple bilateral sub cm low-attenuation renal lesions are demonstrated, too small to accurately characterize. 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 Urinary bladder is unremarkable. Stomach/Bowel: There is marked irregular thickening of the colon the level of the hepatic flexure (image 38; series 2). The mass measures up to 9 cm. There is a small amount of surrounding free fluid. Additionally there is free fluid throughout the upper abdomen and bilateral paracolic gutters. Vascular/Lymphatic: Normal caliber abdominal aorta. Aortic vascular calcifications. There is an 11 mm lymph node within the central mesentery (image 30; series 2). There is a 10 mm nodule adjacent to the hepatic flexure (image 57; series 3). There is a 13 mm soft tissue nodule within the right hemipelvis (image 80; series 2). Other: Uterus is unremarkable. Nonspecific radiodensity within the endometrial canal.  Adnexal structures are unremarkable. Small amount of free fluid in the pelvis. Small fat containing left inguinal hernia. Musculoskeletal: There is an 11 mm lucent lesion within the L2 vertebral body, nonspecific however favored to represent a small hemangioma. IMPRESSION: There is a large (9 cm) mass within the colon at the level of the hepatic flexure most compatible primary colonic carcinoma. There is surrounding free fluid within the upper abdomen. There are multiple small adjacent nodules as well as enlarged mesenteric lymph nodes concerning for metastatic disease. There is a nonspecific 13 mm nodule within the right hemipelvis which may represent a metastatic deposit. Alternatively, given the appearance, this may represent a small amount of loculated fluid. There are nonspecific low-attenuation lesions within the liver, the majority which are subcentimeter in size. Metastatic disease is not excluded. Small pericardial effusion and bilateral small pleural effusions. Aortic vascular calcifications. Electronically Signed   By: Lovey Newcomer M.D.   On: 01/25/2016 13:26   Dg Chest Port 1 View  01/29/2016  CLINICAL DATA:  Status post partial colectomy and porta catheter insertion. EXAM: PORTABLE CHEST 1 VIEW COMPARISON:  01/26/2016 FINDINGS: Large pneumoperitoneum, Porta catheter from the left with tip at the upper cavoatrial junction. No evidence of pneumothorax. Stable heart size allowing for accentuation by low volumes. Mild postoperative atelectasis mainly noted about the left hilum. IMPRESSION:  1. Unremarkable porta catheter with tip at the upper cavoatrial junction. No evidence of pneumothorax. 2. Low volume chest with atelectasis. 3. Abdominal surgery today with large pneumoperitoneum. Electronically Signed   By: Monte Fantasia M.D.   On: 01/29/2016 15:25   Dg C-arm 1-60 Min-no Report  01/29/2016  CLINICAL DATA: portacath insertion C-ARM 1-60 MINUTES Fluoroscopy was utilized by the requesting physician.   No radiographic interpretation.    Lab Results: Basic Metabolic Panel:  Recent Labs  01/31/16 0418  NA 140  K 3.6  CL 109  CO2 27  GLUCOSE 161*  BUN 5*  CREATININE 0.59  CALCIUM 8.3*  MG 1.9  PHOS 3.0   Liver Function Tests: No results for input(s): AST, ALT, ALKPHOS, BILITOT, PROT, ALBUMIN in the last 72 hours.   CBC:  Recent Labs  01/31/16 0418 02/01/16 0431  WBC 12.2* 9.6  HGB 9.6* 8.9*  HCT 29.8* 28.0*  MCV 72.0* 72.5*  PLT 301 265    Recent Results (from the past 240 hour(s))  Culture, Urine     Status: Abnormal   Collection Time: 01/27/16  5:56 AM  Result Value Ref Range Status   Specimen Description URINE, CLEAN CATCH  Final   Special Requests NONE  Final   Culture >=100,000 COLONIES/mL ESCHERICHIA COLI (A)  Final   Report Status 01/30/2016 FINAL  Final   Organism ID, Bacteria ESCHERICHIA COLI (A)  Final      Susceptibility   Escherichia coli - MIC*    AMPICILLIN <=2 SENSITIVE Sensitive     CEFAZOLIN <=4 SENSITIVE Sensitive     CEFTRIAXONE <=1 SENSITIVE Sensitive     CIPROFLOXACIN <=0.25 SENSITIVE Sensitive     GENTAMICIN <=1 SENSITIVE Sensitive     IMIPENEM <=0.25 SENSITIVE Sensitive     NITROFURANTOIN <=16 SENSITIVE Sensitive     TRIMETH/SULFA <=20 SENSITIVE Sensitive     AMPICILLIN/SULBACTAM <=2 SENSITIVE Sensitive     PIP/TAZO <=4 SENSITIVE Sensitive     * >=100,000 COLONIES/mL ESCHERICHIA COLI  Surgical PCR screen     Status: Abnormal   Collection Time: 01/27/16 11:50 AM  Result Value Ref Range Status   MRSA, PCR NEGATIVE NEGATIVE Final   Staphylococcus aureus POSITIVE (A) NEGATIVE Final    Comment:        The Xpert SA Assay (FDA approved for NASAL specimens in patients over 47 years of age), is one component of a comprehensive surveillance program.  Test performance has been validated by [facility] for patients greater than or equal to 47 year old. It is not intended to diagnose infection nor to guide or monitor  treatment. RESULT CALLED TO, READ BACK BY AND VERIFIED WITH:  MCGIBBONY,C @ 1847 ON 01/27/16 BY [facility] Course:   This is a 60 years old female who was admitted for colonoscopy preparation. Patient was a resident of group home and has schizophrenia. Patient was unable to follow instruction and have proper preparation for colonoscopy. After admission colonoscopy was performed which showed large fungating cecal mass suspicious for colon carcinoma. Biopsy was done and it was reported  That it showed highly atypical cells which were suspicious. Surgical consult was done and partial colectomy was performed. Patient tolerated the procedure. She is able tolerate regular diet and she moved her bowel. She is being discharged in stable condition to be followed in out patient. Appointment with oncology requested.  Discharge Exam: Blood pressure 117/86, pulse 68, temperature 97.5 F (36.4 C), temperature source Oral, resp.  rate 20, height 5\' 4"  (1.626 m), weight 67.586 kg (149 lb), SpO2 97 %.   Disposition:  Assisted home        Follow-up Information    Follow up with [facility], MD In 1 week.   Specialty:  Internal Medicine   Contact information:   [place] [address] [phone]       Follow up with [facility] In 2 weeks.   Specialty:  Oncology   Contact information:   [address] [id number] [place] [phone]      Follow up with Jamesetta So, MD In 2 weeks.   Specialty:  General Surgery   Contact information:   [address] [place] [id number] [phone]       Signed: Rosezella Kronick   02/02/2016, 8:02 AM

## 2016-02-02 NOTE — Discharge Instructions (Signed)
Open Colectomy, Care After °Refer to this sheet in the next few weeks. These instructions provide you with information on caring for yourself after your procedure. Your health care provider may also give you more specific instructions. Your treatment has been planned according to current medical practices, but problems sometimes occur. Call your health care provider if you have any problems or questions after your procedure. °WHAT TO EXPECT AFTER THE PROCEDURE °After your procedure, it is typical to have the following: °· Pain in your abdomen, especially along your incision. You will be given medicines to control the pain. °· Tiredness. This is a normal part of the recovery process. Your energy level will return to normal over the next several weeks. °· Constipation. You may be given a stool softener to prevent this. °HOME CARE INSTRUCTIONS °· Only take over-the-counter or prescription medicines as directed by your health care provider. °· Ask your health care provider whether you may take a shower when you go home. °· You may resume a normal diet and activities as directed. Eat plenty of fruits and vegetables to help prevent constipation. °· Drink enough fluids to keep your urine clear or pale yellow. This also helps prevent constipation. °· Take rest breaks during the day as needed. °· Avoid lifting anything heavier than 25 pounds (11.3 kg) or driving for 4 weeks or until your health care provider says it is okay. °· Follow up with your health care provider as directed. Ask your health care provider when you need to return to have your stitches or staples removed. °SEEK MEDICAL CARE IF: °· You have redness, swelling, or increasing pain in the incision area. °· You see pus coming from the incision area. °· You have a fever. °SEEK IMMEDIATE MEDICAL CARE IF:  °· You have chest pain or shortness of breath. °· You have pain or swelling in your legs. °· You have persistent nausea and vomiting. °· Your wound breaks open  after stitches or staples have been removed. °· You have increasing abdominal pain that is not relieved with medicine. °  °This information is not intended to replace advice given to you by your health care provider. Make sure you discuss any questions you have with your health care provider. °  °Document Released: 02/12/2011 Document Revised: 05/12/2013 Document Reviewed: 03/03/2013 °Elsevier Interactive Patient Education ©2016 [employer]vier Inc. ° °

## 2016-02-02 NOTE — Progress Notes (Signed)
Discharged instruction packet given to [facility] staff, out in stable condition via w/c with staff.

## 2016-02-02 NOTE — NC FL2 (Signed)
[place] MEDICAID FL2 LEVEL OF CARE SCREENING TOOL     IDENTIFICATION  Patient Name: Mary Wells Birthdate: 19-Dec-1955 Sex: female Admission Date (Current Location): 01/22/2016  [place] and [place] Number:  [place] [id number] [place] and Address:  [place] [address] [place]      Provider Number: [id number]  Attending Physician Name and Address:  Aviva Signs, MD  Relative Name and Phone Number:       Current Level of Care: Hospital Recommended Level of Care: [place] Prior Approval Number:    Date Approved/Denied:   PASRR Number:    Discharge Plan: Other (Comment) [facility])    Current Diagnoses: Patient Active Problem List   Diagnosis Date Noted  . Colonic mass   . Adenocarcinoma of colon ([place])   . HLD (hyperlipidemia) 01/22/2016  . GERD (gastroesophageal reflux disease) 01/22/2016  . Encounter for screening for cervical cancer  12/21/2015  . Iron deficiency anemia 09/18/2013  . Diabetes mellitus ([place]) 09/15/2013  . Schizophrenia ([place]) 09/15/2013    Orientation RESPIRATION BLADDER Height & Weight     Self, Place, Time  Normal Continent Weight: 149 lb (67.586 kg) Height:  5\' 4"  (162.6 cm)  BEHAVIORAL SYMPTOMS/MOOD NEUROLOGICAL BOWEL NUTRITION STATUS  Other (Comment) (n/a)  (n/a) Continent Diet (heart healthy/carb modified)  AMBULATORY STATUS COMMUNICATION OF NEEDS Skin   Limited Assist Verbally Surgical wounds                       Personal Care Assistance Level of Assistance  Bathing, Feeding, Dressing Bathing Assistance: Limited assistance Feeding assistance: Limited assistance Dressing Assistance: Limited assistance     Functional Limitations Info  Sight, Hearing, Speech Sight Info: Adequate Hearing Info: Adequate Speech Info: Adequate    SPECIAL CARE FACTORS FREQUENCY                       Contractures Contractures Info: Not present    Additional Factors Info  Psychotropic, Insulin  Sliding Scale Code Status Info: Full code Allergies Info: No known allergies Psychotropic Info: Depakote         Current Medications (02/02/2016):  This is the current hospital active medication list Current Facility-Administered Medications  Medication Dose Route Frequency Provider Last Rate Last Dose  . acetaminophen (TYLENOL) tablet 650 mg  650 mg Oral Q6H PRN Aviva Signs, MD       Or  . acetaminophen (TYLENOL) suppository 650 mg  650 mg Rectal Q6H PRN Aviva Signs, MD      . divalproex (DEPAKOTE ER) 24 hr tablet 250 mg  250 mg Oral QHS Aviva Signs, MD   250 mg at 02/01/16 2135  . enoxaparin (LOVENOX) injection 40 mg  40 mg Subcutaneous Q24H Aviva Signs, MD   40 mg at 02/02/16 0857  . fluPHENAZine (PROLIXIN) tablet 5 mg  5 mg Oral BID Rosita Fire, MD   5 mg at 02/02/16 0858  . HYDROcodone-acetaminophen (NORCO/VICODIN) 5-325 MG per tablet 1-2 tablet  1-2 tablet Oral Q4H PRN Aviva Signs, MD   1 tablet at 01/30/16 2234  . HYDROmorphone (DILAUDID) injection 1 mg  1 mg Intravenous Q2H PRN Aviva Signs, MD      . insulin aspart (novoLOG) injection 0-15 Units  0-15 Units Subcutaneous TID WC Aviva Signs, MD   2 Units at 02/02/16 [phone]  . lactated ringers infusion   Intravenous Continuous Aviva Signs, MD 50 mL/hr at 02/01/16 0755    . LORazepam (  ATIVAN) injection 1 mg  1 mg Intravenous Q4H PRN Aviva Signs, MD      . ondansetron (ZOFRAN-ODT) disintegrating tablet 4 mg  4 mg Oral Q6H PRN Aviva Signs, MD       Or  . ondansetron [facility]) injection 4 mg  4 mg Intravenous Q6H PRN Aviva Signs, MD      . simethicone [facility]) chewable tablet 40 mg  40 mg Oral Q6H PRN Aviva Signs, MD         Discharge Medications: Medication List    TAKE these medications       benztropine 2 MG tablet  Commonly known as: COGENTIN  Take 1 mg by mouth 2 (two) times daily.     clonazePAM 0.5 MG tablet  Commonly known as: KLONOPIN  Take 0.5 mg by mouth 2 (two) times daily as needed for  anxiety (prn agitation).     DEPAKOTE ER 250 MG 24 hr tablet  Generic drug: divalproex  Take 250 mg by mouth at bedtime.     docusate sodium 100 MG capsule  Commonly known as: COLACE  Take 100 mg by mouth daily as needed for mild constipation.     famotidine 20 MG tablet  Commonly known as: PEPCID  Take 20 mg by mouth daily.     ferrous sulfate 325 (65 FE) MG tablet  Take 325 mg by mouth 2 (two) times daily with a meal.     fluPHENAZine 5 MG tablet  Commonly known as: PROLIXIN  Take 5 mg by mouth 2 (two) times daily.     loratadine 10 MG tablet  Commonly known as: CLARITIN  Take 10 mg by mouth daily.     metFORMIN 500 MG tablet  Commonly known as: GLUCOPHAGE  Take 500 mg by mouth 2 (two) times daily with a meal.     NON FORMULARY  Take 1 capsule by mouth 2 (two) times daily. Fiber capsules 520 mg.     simvastatin 20 MG tablet  Commonly known as: ZOCOR  Take 20 mg by mouth daily.            Norco 1 tablet po q4hrs prn pain         Relevant Imaging Results:  Relevant Lab Results:   Additional Information Pt has port-a-cath.  Benay Pike Pleak, [place]

## 2016-02-12 ENCOUNTER — Encounter (HOSPITAL_COMMUNITY): Payer: Self-pay

## 2016-02-19 ENCOUNTER — Ambulatory Visit (HOSPITAL_COMMUNITY): Payer: Medicaid Other | Admitting: Hematology & Oncology

## 2016-02-20 ENCOUNTER — Ambulatory Visit (HOSPITAL_COMMUNITY): Payer: Medicaid Other | Admitting: Hematology & Oncology

## 2016-02-20 ENCOUNTER — Other Ambulatory Visit (HOSPITAL_COMMUNITY): Payer: Self-pay | Admitting: Hematology & Oncology

## 2016-02-20 ENCOUNTER — Telehealth (HOSPITAL_COMMUNITY): Payer: Self-pay | Admitting: *Deleted

## 2016-02-20 DIAGNOSIS — C189 Malignant neoplasm of colon, unspecified: Secondary | ICD-10-CM

## 2016-02-20 DIAGNOSIS — R911 Solitary pulmonary nodule: Secondary | ICD-10-CM

## 2016-02-20 NOTE — Telephone Encounter (Signed)
[id number] specimen has already been tested for MSI by PCR on 02/02/16. Results printed and given to Dr. Whitney Muse.

## 2016-02-21 ENCOUNTER — Ambulatory Visit (HOSPITAL_COMMUNITY): Payer: Medicaid Other

## 2016-02-28 ENCOUNTER — Ambulatory Visit (HOSPITAL_COMMUNITY): Payer: Medicaid Other

## 2016-03-01 ENCOUNTER — Ambulatory Visit (HOSPITAL_COMMUNITY): Payer: Medicaid Other

## 2016-03-01 ENCOUNTER — Encounter (HOSPITAL_COMMUNITY): Payer: Medicaid Other

## 2016-03-01 ENCOUNTER — Encounter (HOSPITAL_COMMUNITY): Payer: Medicaid Other | Attending: Hematology | Admitting: Hematology

## 2016-03-01 VITALS — BP 126/62 | HR 71 | Temp 97.8°F | Resp 18 | Ht 63.0 in | Wt 146.4 lb

## 2016-03-01 DIAGNOSIS — Z9189 Other specified personal risk factors, not elsewhere classified: Secondary | ICD-10-CM

## 2016-03-01 DIAGNOSIS — C182 Malignant neoplasm of ascending colon: Secondary | ICD-10-CM

## 2016-03-01 DIAGNOSIS — D509 Iron deficiency anemia, unspecified: Secondary | ICD-10-CM

## 2016-03-01 LAB — COMPREHENSIVE METABOLIC PANEL
ALK PHOS: 92 U/L (ref 38–126)
ALT: 17 U/L (ref 14–54)
AST: 33 U/L (ref 15–41)
Albumin: 3.8 g/dL (ref 3.5–5.0)
Anion gap: 6 (ref 5–15)
BILIRUBIN TOTAL: 0.2 mg/dL — AB (ref 0.3–1.2)
BUN: 11 mg/dL (ref 6–20)
CALCIUM: 9.5 mg/dL (ref 8.9–10.3)
CO2: 25 mmol/L (ref 22–32)
Chloride: 107 mmol/L (ref 101–111)
Creatinine, Ser: 0.75 mg/dL (ref 0.44–1.00)
GFR calc Af Amer: >60 mL/min (ref >60)
Glucose, Bld: 88 mg/dL (ref 65–99)
POTASSIUM: 4.5 mmol/L (ref 3.5–5.1)
Sodium: 138 mmol/L (ref 135–145)
TOTAL PROTEIN: 7.9 g/dL (ref 6.5–8.1)

## 2016-03-01 LAB — CBC WITH DIFFERENTIAL/PLATELET
Basophils Absolute: 0 10*3/uL (ref 0.0–0.1)
Basophils Relative: 1 %
Eosinophils Absolute: 0.2 10*3/uL (ref 0.0–0.7)
Eosinophils Relative: 4 %
HEMATOCRIT: 37.4 % (ref 36.0–46.0)
Hemoglobin: 11.6 g/dL — ABNORMAL LOW (ref 12.0–15.0)
LYMPHS PCT: 44 %
Lymphs Abs: 2.6 10*3/uL (ref 0.7–4.0)
MCH: 23.9 pg — ABNORMAL LOW (ref 26.0–34.0)
MCHC: 31 g/dL (ref 30.0–36.0)
MCV: 77 fL — AB (ref 78.0–100.0)
MONO ABS: 0.5 10*3/uL (ref 0.1–1.0)
MONOS PCT: 8 %
NEUTROS ABS: 2.6 10*3/uL (ref 1.7–7.7)
Neutrophils Relative %: 44 %
Platelets: 145 10*3/uL — ABNORMAL LOW (ref 150–400)
RBC: 4.86 MIL/uL (ref 3.87–5.11)
RDW: 21.8 % — AB (ref 11.5–15.5)
WBC: 5.9 10*3/uL (ref 4.0–10.5)

## 2016-03-01 LAB — FERRITIN: FERRITIN: 102 ng/mL (ref 11–307)

## 2016-03-01 NOTE — Patient Instructions (Addendum)
[place] at [facility]  Discharge Instructions:  You were seen by Sullivan Lone, MD today.   Labs will be drawn today.   PET/CT Scan to be scheduled.  Return for follow up in 2 weeks.   _______________________________________________________________  Thank you for choosing [place] at [facility] to provide your oncology and hematology care.  To afford each patient quality time with our providers, please arrive at least 15 minutes before your scheduled appointment.  You need to re-schedule your appointment if you arrive 10 or more minutes late.  We strive to give you quality time with our providers, and arriving late affects you and other patients whose appointments are after yours.  Also, if you no show three or more times for appointments you may be dismissed from the clinic.  Again, thank you for choosing [place] at [place] hope is that these requests will allow you access to exceptional care and in a timely manner. _______________________________________________________________  If you have questions after your visit, please contact our office at [phone] between the hours of 8:30 a.m. and 5:00 p.m. Voicemails left after 4:30 p.m. will not be returned until the following business day. _______________________________________________________________  For prescription refill requests, have your pharmacy contact our office. _______________________________________________________________  Recommendations made by the consultant and any test results will be sent to your referring physician. _______________________________________________________________

## 2016-03-02 LAB — CEA: CEA: 4.4 ng/mL (ref 0.0–4.7)

## 2016-03-06 ENCOUNTER — Ambulatory Visit (HOSPITAL_COMMUNITY): Payer: Medicaid Other

## 2016-03-15 ENCOUNTER — Ambulatory Visit (HOSPITAL_COMMUNITY): Payer: Medicaid Other | Admitting: Oncology

## 2016-03-15 ENCOUNTER — Ambulatory Visit (HOSPITAL_COMMUNITY)
Admission: RE | Admit: 2016-03-15 | Discharge: 2016-03-15 | Disposition: A | Discharge status: Home / Self Care | Payer: Medicaid Other | Source: Ambulatory Visit | Attending: Hematology & Oncology | Admitting: Hematology & Oncology

## 2016-03-15 DIAGNOSIS — R911 Solitary pulmonary nodule: Secondary | ICD-10-CM | POA: Insufficient documentation

## 2016-03-15 DIAGNOSIS — C189 Malignant neoplasm of colon, unspecified: Secondary | ICD-10-CM | POA: Diagnosis not present

## 2016-03-15 DIAGNOSIS — Z9049 Acquired absence of other specified parts of digestive tract: Secondary | ICD-10-CM | POA: Diagnosis not present

## 2016-03-15 LAB — GLUCOSE, CAPILLARY: Glucose-Capillary: 85 mg/dL (ref 65–99)

## 2016-03-15 MED ORDER — FLUDEOXYGLUCOSE F - 18 (FDG) INJECTION
7.3100 | Freq: Once | INTRAVENOUS | Status: AC | PRN
  Administered 2016-03-15: 7.31 via INTRAVENOUS

## 2016-04-02 ENCOUNTER — Ambulatory Visit (HOSPITAL_COMMUNITY): Payer: Medicaid Other | Admitting: Hematology & Oncology

## 2016-04-02 NOTE — Progress Notes (Signed)
[facility], MD [address] [place] [address]  Adenocarcinoma of colon [facility]) - Plan: CBC with Differential, CEA, Ferritin, CBC with Differential, CEA, Ferritin    Adenocarcinoma of colon ([place])   01/25/2016 Imaging    CT C/A/P large 9 cm mass within the colon at level of hepatic flexure, enlarged mesenteric LN concerning for metastatic disease, There is a nonspecific 13 mm nodule within the right hemipelvis which may represent a metastatic deposit. Alternatively, given the appearance, this may represent a small amount of loculated fluid. There are nonspecific low-attenuation lesions within the liver, the majority which are subcentimeter in size. Metastatic disease is not excluded.       01/25/2016 Procedure    Colonoscopy on 6/22 with Large fungating, exophytic tumor appears to be arising out of the base of cecum / appendix. It's partially obscuring the ileocecal valve.      01/29/2016 Surgery    R hemicolectomy with resection of terminal ileum, liver biopsy with Dr. Arnoldo Morale      01/29/2016 Pathology Results    INVASIVE WELL DIFFERENTIATED ADENOCARCINOMA WITH ABUNDANT EXTRACELLULAR MUCIN, SPANNING 19 CM IN GREATEST DIMENSION. - TUMOR INVADES THROUGH MUSCULARIS PROPRIA THROUGH THE SEROSA OF ONE BOWEL SEGMENT INTO A SECOND ADHESED SEGMENT OF BOWEL. - MARGINS ARE NEGATIVE.No extramural satellite tumor nodules seen, 0/27 LN, Liver biopsy with benign liver tissue      03/15/2016 PET scan    No abnormal metabolic activity status post interval right hemicolectomy. No evidence of residual or metastatic disease. The right upper lobe pulmonary nodule is stable and without hypermetabolic activity. This favors a benign etiology.       CURRENT THERAPY: Observation, IV iron prn  INTERVAL HISTORY: Mary Wells 60 y.o. female returns for followup of iron deficiency anemia with no evidence of bone marrow disorder or hemoglobinopathy having received intravenous iron on  10/22/2013 with patient failure to follow-up as scheduled, last seen on 08/06/2014.  She has documented noncompliance with GI follow-up as well.  Her last colonoscopy was on 12/04/2010 by Dr. Oneida Alar that demonstrated a limited exam due to poor prep, no polyps, masses, inflammatory changes, diverticular, or AVMs were identified at that time.  Repeat colonoscopy 5 years later was recommended with a 2 day bowel prep under direct supervision. She was admited on 01/22/2016 for bowel prep with planned colonoscopy with noted history of blood in stool.   She has recovered well from surgery. She is here today to further review recent PET CT and to establish ongoing follow-up. Appetite is described as good.  GI note from 11/2015   Past Medical History:  Diagnosis Date  . Adenocarcinoma of colon ([place])   . Allergy   . Diabetes mellitus without complication ([place]) 123456  . GERD (gastroesophageal reflux disease)   . Iron deficiency anemia 09/18/2013  . Schizophrenia ([place])     has Diabetes mellitus ([place]); Schizophrenia ([employer]); Iron deficiency anemia; Encounter for screening for cervical cancer ; HLD (hyperlipidemia); GERD (gastroesophageal reflux disease); Adenocarcinoma of colon ([place]); and Colonic mass on her problem list.     has No Known Allergies.  Ms. Jasmin had no medications administered during this visit.  Past Surgical History:  Procedure Laterality Date  . BIOPSY  01/24/2016   Procedure: BIOPSY;  Surgeon: Daneil Dolin, MD;  [facility];  Service: Endoscopy;;  cecal mass  . COLONOSCOPY  04/21/2006   SLF: limited colonoscopy due to a poor bowel prep   . COLONOSCOPY  02/22/2009   SLF: poor bowel prep/Formed stools in the right colon which limited the extent of the exam/The scope was passed to approximately the proximal transverse colon.  No polyps, masses, inflammatory changes, diverticular/normal rectum  . COLONOSCOPY WITH PROPOFOL N/A 01/24/2016   Procedure: COLONOSCOPY WITH PROPOFOL;   Surgeon: Daneil Dolin, MD;  Location[facility];  Service: Endoscopy;  Laterality: N/A;  . EXCISIONAL HEMORRHOIDECTOMY    . LIVER BIOPSY N/A 01/29/2016   Procedure: LIVER BIOPSY;  Surgeon: Aviva Signs, MD;  Location: [facility] ORS;  Service: General;  Laterality: N/A;  . PARTIAL COLECTOMY N/A 01/29/2016   Procedure: PARTIAL COLECTOMY ;  Surgeon: Aviva Signs, MD;  Location: [facility] ORS;  Service: General;  Laterality: N/A;  . PORTACATH PLACEMENT Left 01/29/2016   Procedure: INSERTION PORT-A-CATH;  Surgeon: Aviva Signs, MD;  Location: [facility] ORS;  Service: General;  Laterality: Left;  left subclavian    Denies any headaches, dizziness, double vision, fevers, chills, night sweats, nausea, vomiting, diarrhea, constipation, chest pain, heart palpitations, shortness of breath, blood in stool, black tarry stool, urinary pain, urinary burning, urinary frequency, hematuria. 14 point review of systems was performed and is negative except as detailed under history of present illness and above   PHYSICAL EXAMINATION  ECOG PERFORMANCE STATUS: 1 - Symptomatic but completely ambulatory  Vitals:   04/03/16 1342  BP: (!) 126/53  Pulse: (!) 50  Resp: 16  Temp: 97.5 F (36.4 C)    GENERAL:alert, no distress, well nourished, well developed, comfortable, cooperative and smiling SKIN: skin color, texture, turgor are normal, no rashes or significant lesions HEAD: Normocephalic, No masses, lesions, tenderness or abnormalities EYES: normal, PERRLA, EOMI, Conjunctiva are pink and non-injected EARS: External ears normal OROPHARYNX:lips, buccal mucosa, and tongue normal and mucous membranes are moist  NECK: supple, thyroid normal size, non-tender, without nodularity, no stridor, non-tender, trachea midline LYMPH:  no palpable lymphadenopathy BREAST:not examined LUNGS: clear to auscultation with normal percussion. No wheezes, rales, or rhonchi. HEART: regular rate & rhythm without murmur, rub, or gallop. Normal S1 and  S2. ABDOMEN:abdomen soft and normal bowel sounds, well healed surgical incision site, complaints of mild tenderness with palpation over incision BACK: No CVA tenderness EXTREMITIES:no joint deformities, effusion, or inflammation, no skin discoloration  NEURO: alert & oriented x 3 with fluent speech, no focal motor/sensory deficits, gait normal, mentally handicapped   LABORATORY DATA: I have reviewed the data as listed. CBC    Component Value Date/Time   WBC 5.4 04/03/2016 1522   RBC 5.02 04/03/2016 1522   HGB 12.2 04/03/2016 1522   HCT 39.2 04/03/2016 1522   PLT 194 04/03/2016 1522   MCV 78.1 04/03/2016 1522   MCH 24.3 (L) 04/03/2016 1522   MCHC 31.1 04/03/2016 1522   RDW 19.7 (H) 04/03/2016 1522   LYMPHSABS 2.8 04/03/2016 1522   MONOABS 0.4 04/03/2016 1522   EOSABS 0.2 04/03/2016 1522   BASOSABS 0.0 04/03/2016 1522      Chemistry      Component Value Date/Time   NA 138 03/01/2016 1503   K 4.5 03/01/2016 1503   CL 107 03/01/2016 1503   CO2 25 03/01/2016 1503   BUN 11 03/01/2016 1503   CREATININE 0.75 03/01/2016 1503      Component Value Date/Time   CALCIUM 9.5 03/01/2016 1503   ALKPHOS 92 03/01/2016 1503   AST 33 03/01/2016 1503   ALT 17 03/01/2016 1503   BILITOT 0.2 (L) 03/01/2016 1503     Lab Results  Component  Value Date   IRON 14 (L) 01/25/2016   TIBC 165 (L) 01/25/2016   FERRITIN 77 04/03/2016      PATHOLOGY            ASSESSMENT AND PLAN:  T4b N0 Colon Cancer Iron deficiency Anemia CT suspicious for hepatic disease, negative liver biopsy Medical non-compliance Schizophrenia  Tumor was 19 cm in dimension, no LVI, no PNI, G1, low grade. CT imaging was suspicious for metastatic disease but liver biopsy was benign. Pathology report did not note any extramural satellite tumor nodules. PET imaging was reviewed with the patient and family,it showed no evidence of metastatic disease, pulmonary nodule was not hypermetabolic and felt to be benign.  I have recommended close observation.   CBC is WNL. Ferritin is at 77 ng/ml. Will continue to follow but hopefully now iron deficiency will no longer be a problem moving forward.  CEA has normalized which is also reassuring.   RTC 3 months with repeat labs.  Genetics referral will be addressed at follow-up.   All questions were answered. The patient knows to call the clinic with any problems, questions or concerns. We can certainly see the patient much sooner if necessary.  This document serves as a record of services personally performed by Ancil Linsey, MD. It was created on her behalf by Arlyce Harman, a trained medical scribe. The creation of this record is based on the scribe's personal observations and the provider's statements to them. This document has been checked and approved by the attending provider.  I have reviewed the above documentation for accuracy and completeness and I agree with the above.  Molli Hazard 04/07/2016 9:28 PM

## 2016-04-03 ENCOUNTER — Encounter (HOSPITAL_COMMUNITY): Payer: Self-pay | Admitting: Hematology & Oncology

## 2016-04-03 ENCOUNTER — Encounter (HOSPITAL_COMMUNITY): Payer: Medicaid Other

## 2016-04-03 ENCOUNTER — Encounter (HOSPITAL_COMMUNITY): Payer: Medicaid Other | Attending: Hematology | Admitting: Hematology & Oncology

## 2016-04-03 VITALS — BP 126/53 | HR 50 | Temp 97.5°F | Resp 16 | Wt 148.0 lb

## 2016-04-03 DIAGNOSIS — Z91199 Patient's noncompliance with other medical treatment and regimen due to unspecified reason: Secondary | ICD-10-CM

## 2016-04-03 DIAGNOSIS — D509 Iron deficiency anemia, unspecified: Secondary | ICD-10-CM

## 2016-04-03 DIAGNOSIS — Z9189 Other specified personal risk factors, not elsewhere classified: Secondary | ICD-10-CM | POA: Insufficient documentation

## 2016-04-03 DIAGNOSIS — C189 Malignant neoplasm of colon, unspecified: Secondary | ICD-10-CM | POA: Diagnosis present

## 2016-04-03 DIAGNOSIS — F200 Paranoid schizophrenia: Secondary | ICD-10-CM

## 2016-04-03 DIAGNOSIS — Z9119 Patient's noncompliance with other medical treatment and regimen: Secondary | ICD-10-CM

## 2016-04-03 LAB — CBC WITH DIFFERENTIAL/PLATELET
BASOS ABS: 0 10*3/uL (ref 0.0–0.1)
Basophils Relative: 0 %
EOS PCT: 3 %
Eosinophils Absolute: 0.2 10*3/uL (ref 0.0–0.7)
HCT: 39.2 % (ref 36.0–46.0)
HEMOGLOBIN: 12.2 g/dL (ref 12.0–15.0)
LYMPHS PCT: 52 %
Lymphs Abs: 2.8 10*3/uL (ref 0.7–4.0)
MCH: 24.3 pg — ABNORMAL LOW (ref 26.0–34.0)
MCHC: 31.1 g/dL (ref 30.0–36.0)
MCV: 78.1 fL (ref 78.0–100.0)
Monocytes Absolute: 0.4 10*3/uL (ref 0.1–1.0)
Monocytes Relative: 7 %
NEUTROS ABS: 2 10*3/uL (ref 1.7–7.7)
NEUTROS PCT: 38 %
PLATELETS: 194 10*3/uL (ref 150–400)
RBC: 5.02 MIL/uL (ref 3.87–5.11)
RDW: 19.7 % — ABNORMAL HIGH (ref 11.5–15.5)
WBC: 5.4 10*3/uL (ref 4.0–10.5)

## 2016-04-03 LAB — FERRITIN: Ferritin: 77 ng/mL (ref 11–307)

## 2016-04-03 MED ORDER — HYDROCODONE-ACETAMINOPHEN 5-325 MG PO TABS: 1.0000 | ORAL_TABLET | ORAL | 0 refills | Status: DC | PRN

## 2016-04-03 NOTE — Patient Instructions (Addendum)
[place] at [facility] Discharge Instructions  RECOMMENDATIONS MADE BY THE CONSULTANT AND ANY TEST RESULTS WILL BE SENT TO YOUR REFERRING PHYSICIAN.  You saw Dr. Whitney Muse today. Labs today. Follow up in 3 months with lab work.  Thank you for choosing [place] at [facility] to provide your oncology and hematology care.  To afford each patient quality time with our provider, please arrive at least 15 minutes before your scheduled appointment time.   Beginning January 23rd 2017 lab work for the [facility] will be done in the  Main lab at [facility] on 1st floor. If you have a lab appointment with the [facility] please come in thru the  Main Entrance and check in at the main information desk  You need to re-schedule your appointment should you arrive 10 or more minutes late.  We strive to give you quality time with our providers, and arriving late affects you and other patients whose appointments are after yours.  Also, if you no show three or more times for appointments you may be dismissed from the clinic at the providers discretion.     Again, thank you for choosing [facility].  Our hope is that these requests will decrease the amount of time that you wait before being seen by our physicians.       _____________________________________________________________  Should you have questions after your visit to [facility], please contact our office at [phone] between the hours of 8:30 a.m. and 4:30 p.m.  Voicemails left after 4:30 p.m. will not be returned until the following business day.  For prescription refill requests, have your pharmacy contact our office.         Resources For Cancer Patients and their Caregivers ? American Cancer Society: Can assist with transportation, wigs, general needs, runs Look Good Feel Better.        ([phone] ? Cancer Care: Provides financial assistance, online  support groups, medication/co-pay assistance.  1-800-813-HOPE 8041998448) ? [facility] Assists [place] cancer patients and their families through emotional , educational and financial support.  719-786-8756 ? [place] DSS Where to apply for food stamps, Medicaid and utility assistance. 442-238-8873 ? [employer]: Transportation to medical appointments. 5716600788 ? Social Security Administration: May apply for disability if have a Stage IV cancer. (986) 511-9449 314-288-1271 ? [employer], Disability and Transit Services: Assists with nutrition, care and transit needs. [place] Support Programs: @10RELATIVEDAYS @ > Cancer Support Group  2nd Tuesday of the month 1pm-2pm, Journey Room  > Creative Journey  3rd Tuesday of the month 1130am-1pm, Journey Room  > Look Good Feel Better  1st Wednesday of the month 10am-12 noon, Journey Room (Call [place] to register [phone])

## 2016-04-04 LAB — CEA: CEA: 4.7 ng/mL (ref 0.0–4.7)

## 2016-04-08 NOTE — Progress Notes (Signed)
[facility]    HEMATOLOGY/ONCOLOGY CONSULTATION NOTE  Date of Service: .03/01/2016  Patient Care Team: Rosita Fire, MD as PCP - General (Internal Medicine)  CHIEF COMPLAINTS/PURPOSE OF CONSULTATION:  Newly diagnosed Colon adenocarcinoma  HISTORY OF PRESENTING ILLNESS:   Mary Wells is a wonderful 60 y.o. female who has been referred to [place] by Dr .Rosita Fire, MD for evaluation and management of newly diagnosed colon adenocarcinoma.  Patient is a 60 yo AAF with a h/o debilitating paranoid schizophrenia (lives in a group home, does not have decisional capacity - decision makers - Aunt, children and group home), DM2, HLD , GERD who presented to with about 30lbs weight loss over the last year and Iron deficiency anemia. She recently was admitted for bowel prep and had a colonoscopy on 01/23/2016 by Dr Sydell Axon and was noted to have a large fungating mass in the cecum/appendix. Biopsy was suspicious for adenocarcinoma. She subsequently underwent rt Partial colectomy with resection of the terminal ileum, liver biopsy and Port-a-cath placement on 01/29/2016 by Dr Aviva Signs MD. Pathology revealed a 19cm tumor (pT4b , PN0, MX) well differentiated adenocarcinoma, 27 examined LN's were negative. Liver biopsy of suspicious area did not show tumor,.  Patient has healed well from surgery and has no significant complications. Did receive IV Feraheme in the hospital for Iron deficiency anemia. She is here about 4 weeks from surgery with her Group home staff to discuss role for any adjuvant treatment.   MEDICAL HISTORY:  Past Medical History:  Diagnosis Date  . Adenocarcinoma of colon ([place])   . Allergy   . Diabetes mellitus without complication ([place]) 5631  . GERD (gastroesophageal reflux disease)   . Iron deficiency anemia 09/18/2013  . Schizophrenia ([place])     SURGICAL HISTORY: Past Surgical History:  Procedure Laterality Date  . BIOPSY  01/24/2016   Procedure: BIOPSY;  Surgeon: Daneil Dolin, MD;   [facility];  Service: Endoscopy;;  cecal mass  . COLONOSCOPY  04/21/2006   SLF: limited colonoscopy due to a poor bowel prep   . COLONOSCOPY  02/22/2009   SLF: poor bowel prep/Formed stools in the right colon which limited the extent of the exam/The scope was passed to approximately the proximal transverse colon.  No polyps, masses, inflammatory changes, diverticular/normal rectum  . COLONOSCOPY WITH PROPOFOL N/A 01/24/2016   Procedure: COLONOSCOPY WITH PROPOFOL;  Surgeon: Daneil Dolin, MD;  Location: [facility];  Service: Endoscopy;  Laterality: N/A;  . EXCISIONAL HEMORRHOIDECTOMY    . LIVER BIOPSY N/A 01/29/2016   Procedure: LIVER BIOPSY;  Surgeon: Aviva Signs, MD;  Location: [facility] ORS;  Service: General;  Laterality: N/A;  . PARTIAL COLECTOMY N/A 01/29/2016   Procedure: PARTIAL COLECTOMY ;  Surgeon: Aviva Signs, MD;  Location: [facility] ORS;  Service: General;  Laterality: N/A;  . PORTACATH PLACEMENT Left 01/29/2016   Procedure: INSERTION PORT-A-CATH;  Surgeon: Aviva Signs, MD;  Location: [facility] ORS;  Service: General;  Laterality: Left;  left subclavian    SOCIAL HISTORY: Social History   Social History  . Marital status: Single    Spouse name: N/A  . Number of children: N/A  . Years of education: N/A   Occupational History  . Not on file.   Social History Main Topics  . Smoking status: Former Research scientist (life sciences)  . Smokeless tobacco: Never Used     Comment: quit 10-15 years  . Alcohol use No  . Drug use: No  . Sexual activity: No   Other Topics Concern  .  Not on file   Social History Narrative  . No narrative on file    FAMILY HISTORY: Family History  Problem Relation Age of Onset  . Leukemia Paternal Grandfather   . Leukemia Cousin   . Colon cancer Neg Hx     ALLERGIES:  has No Known Allergies.  MEDICATIONS:  Current Outpatient Prescriptions  Medication Sig Dispense Refill  . benztropine (COGENTIN) 2 MG tablet Take 1 mg by mouth 2 (two) times daily.     .  clonazePAM (KLONOPIN) 0.5 MG tablet Take 0.5 mg by mouth 2 (two) times daily as needed for anxiety (prn agitation).    Marland Kitchen divalproex (DEPAKOTE ER) 250 MG 24 hr tablet Take 250 mg by mouth at bedtime.    . docusate sodium (COLACE) 100 MG capsule Take 100 mg by mouth daily as needed for mild constipation.    . famotidine (PEPCID) 20 MG tablet Take 20 mg by mouth daily.    . ferrous sulfate 325 (65 FE) MG tablet Take 325 mg by mouth 2 (two) times daily with a meal.    . fluPHENAZine (PROLIXIN) 5 MG tablet Take 5 mg by mouth 2 (two) times daily.    Marland Kitchen HYDROcodone-acetaminophen (NORCO/VICODIN) 5-325 MG tablet Take 1 tablet by mouth every 4 (four) hours as needed for moderate pain. 60 tablet 0  . loratadine (CLARITIN) 10 MG tablet Take 10 mg by mouth daily.    . metFORMIN (GLUCOPHAGE) 500 MG tablet Take 500 mg by mouth 2 (two) times daily with a meal.     . NON FORMULARY Take 1 capsule by mouth 2 (two) times daily. Fiber capsules 520 mg.    . simvastatin (ZOCOR) 20 MG tablet Take 20 mg by mouth daily.     No current facility-administered medications for this visit.     REVIEW OF SYSTEMS:    10 Point review of Systems was done is negative except as noted above.  PHYSICAL EXAMINATION: ECOG PERFORMANCE STATUS: 2 - Symptomatic, <50% confined to bed  . Vitals:   03/01/16 1348  BP: 126/62  Pulse: 71  Resp: 18  Temp: 97.8 F (36.6 C)   Filed Weights   03/01/16 1348  Weight: 146 lb 7 oz (66.4 kg)   .Body mass index is 25.94 kg/m.  GENERAL:alert, in no acute distress and comfortable SKIN: skin color, texture, turgor are normal, no rashes or significant lesions EYES: normal, conjunctiva are pink and non-injected, sclera clear OROPHARYNX:no exudate, no erythema and lips, buccal mucosa, and tongue normal  NECK: supple, no JVD, thyroid normal size, non-tender, without nodularity LYMPH:  no palpable lymphadenopathy in the cervical, axillary or inguinal LUNGS: clear to auscultation with normal  respiratory effort HEART: regular rate & rhythm,  no murmurs and no lower extremity edema ABDOMEN: abdomen soft, non-tender, normoactive bowel sounds , surgical incision healing well. Musculoskeletal: no cyanosis of digits and no clubbing  PSYCH: alert & oriented x 3 with fluent speech NEURO: no focal motor/sensory deficits  LABORATORY DATA:  I have reviewed the data as listed  . CBC Latest Ref Rng & Units 03/01/2016 02/01/2016  WBC 4.0 - 10.5 K/uL 5.9 9.6  Hemoglobin 12.0 - 15.0 g/dL 11.6(L) 8.9(L)  Hematocrit 36.0 - 46.0 % 37.4 28.0(L)  Platelets 150 - 400 K/uL 145(L) 265    . CMP Latest Ref Rng & Units 03/01/2016 01/31/2016 01/30/2016  Glucose 65 - 99 mg/dL 88 161(H) 158(H)  BUN 6 - 20 mg/dL 11 5(L) 5(L)  Creatinine 0.44 - 1.00 mg/dL  0.75 0.59 0.64  Sodium 135 - 145 mmol/L 138 140 137  Potassium 3.5 - 5.1 mmol/L 4.5 3.6 3.7  Chloride 101 - 111 mmol/L 107 109 106  CO2 22 - 32 mmol/L 25 27 25   Calcium 8.9 - 10.3 mg/dL 9.5 8.3(L) 8.3(L)  Total Protein 6.5 - 8.1 g/dL 7.9 - -  Total Bilirubin 0.3 - 1.2 mg/dL 0.2(L) - -  Alkaline Phos 38 - 126 U/L 92 - -  AST 15 - 41 U/L 33 - -  ALT 14 - 54 U/L 17 - -            Colonoscopy 01/23/2016: Findings: Large fungating, exophytic tumor appears to be arising out of the base of cecum / appendix. It's partially obscuring the ileocecal valve. Please see multiple photographs. The colon was extremely tortuous and redundant. It was a struggle to make it to the cecum utilizing repeated changes in the patient's position and external counterpressure. Multiple biopsies of the cecal lesion were taken. The remainder of the rectonic mucosa. Preparation was excellent. - Colon cancer - probably cecal in origin (landmarks obscured) status post biopsy. Tortuous colon.  RADIOGRAPHIC STUDIES: I have personally reviewed the radiological images as listed and agreed with the findings in the report.  ASSESSMENT & PLAN:   60 yo AAF in a group home  for paranoid schizophrenia with  1) Newly diagnosed rt sided Colon adenocarcinoma, well differentiation (pT4b, pN0, Mx) Atleast Stage IIC. Patient is s/p rt hemicolectomy with resection of terminal ileum on 01/29/2016. She is about 4 weeks out from surgery and is healing well. She has already had a port-a cath placed. 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 Post-operative anemia has much improved. She did receive IV iron in the hospital previously.  3) Paranoid schizophrenia - no decisional capacity. Is accompanied by family and group home staff. I am told the patients Aunt, children and group home staff usually make a joint decision.  Plan -I explained to the children and accompanying group home staff in great details the diagnosis, need for staging, role of adjuvant treatments for colon cancer and goals of care/treatment discussion was initiated. -given MSI high status --might have bearing on need for adjuvant chemotherapy in Stage II disease. -consider genetic testing to r/o lynch syndrome and if present might need screening of family members. -they were agreeable to gets labs including CEA baseline levels -consent was obtained and PET/CT was ordered to complete the patients clinical staging. -continue post-op f/u with surgery as per their instructions. -high risk for B12 deficiency given resection of terminal ileum -- will need to be monitored. -monitor and replace Iron further as needed -RTC with Dr Whitney Muse in 2-3 weeks after PET/CT to determine further treatment  4) . Patient Active Problem List   Diagnosis Date Noted  . Colonic mass   . Adenocarcinoma of colon ([place])   . HLD (hyperlipidemia) 01/22/2016  . GERD (gastroesophageal reflux disease) 01/22/2016  . Encounter for screening for cervical cancer  12/21/2015  . Iron deficiency anemia 09/18/2013  . Diabetes mellitus ([place]) 09/15/2013  .  Schizophrenia ([place]) 09/15/2013   -continue f/u with PCP for management of medical co-morbids  All of the patients family's and group staff's questions were answered to their apparent satisfaction and they are agreeable with this plan.  I spent 60 minutes counseling the patient/family/group home staff face to face. The total time spent  in the appointment was 70 minutes and more than 50% was on counseling and direct patient cares.    Sullivan Lone MD [place] AAHIVMS [facility] Hematology/Oncology Physician [facility]  (Office):       [phone] (Work cell):  [phone] (Fax):           [phone]

## 2016-05-08 ENCOUNTER — Other Ambulatory Visit (HOSPITAL_COMMUNITY): Payer: Self-pay | Admitting: Internal Medicine

## 2016-05-08 DIAGNOSIS — Z1231 Encounter for screening mammogram for malignant neoplasm of breast: Secondary | ICD-10-CM

## 2016-05-15 ENCOUNTER — Ambulatory Visit (HOSPITAL_COMMUNITY)
Admission: RE | Admit: 2016-05-15 | Discharge: 2016-05-15 | Disposition: A | Discharge status: Home / Self Care | Payer: Medicaid Other | Source: Ambulatory Visit | Attending: Internal Medicine | Admitting: Internal Medicine

## 2016-05-15 DIAGNOSIS — Z1231 Encounter for screening mammogram for malignant neoplasm of breast: Secondary | ICD-10-CM | POA: Diagnosis not present

## 2016-07-04 ENCOUNTER — Encounter (HOSPITAL_COMMUNITY): Payer: Medicaid Other | Attending: Oncology | Admitting: Oncology

## 2016-07-04 ENCOUNTER — Encounter (HOSPITAL_COMMUNITY): Payer: Medicaid Other

## 2016-07-04 DIAGNOSIS — D509 Iron deficiency anemia, unspecified: Secondary | ICD-10-CM | POA: Diagnosis not present

## 2016-07-04 DIAGNOSIS — D500 Iron deficiency anemia secondary to blood loss (chronic): Secondary | ICD-10-CM

## 2016-07-04 DIAGNOSIS — C189 Malignant neoplasm of colon, unspecified: Secondary | ICD-10-CM

## 2016-07-04 DIAGNOSIS — C182 Malignant neoplasm of ascending colon: Secondary | ICD-10-CM

## 2016-07-04 LAB — CBC WITH DIFFERENTIAL/PLATELET
Basophils Absolute: 0 10*3/uL (ref 0.0–0.1)
Basophils Relative: 1 %
EOS ABS: 0.1 10*3/uL (ref 0.0–0.7)
EOS PCT: 2 %
HCT: 39 % (ref 36.0–46.0)
Hemoglobin: 12.4 g/dL (ref 12.0–15.0)
LYMPHS ABS: 2.6 10*3/uL (ref 0.7–4.0)
Lymphocytes Relative: 46 %
MCH: 25.2 pg — AB (ref 26.0–34.0)
MCHC: 31.8 g/dL (ref 30.0–36.0)
MCV: 79.1 fL (ref 78.0–100.0)
MONO ABS: 0.4 10*3/uL (ref 0.1–1.0)
Monocytes Relative: 8 %
Neutro Abs: 2.4 10*3/uL (ref 1.7–7.7)
Neutrophils Relative %: 43 %
PLATELETS: 142 10*3/uL — AB (ref 150–400)
RBC: 4.93 MIL/uL (ref 3.87–5.11)
RDW: 16.7 % — AB (ref 11.5–15.5)
WBC: 5.6 10*3/uL (ref 4.0–10.5)

## 2016-07-04 LAB — FERRITIN: FERRITIN: 68 ng/mL (ref 11–307)

## 2016-07-04 MED ORDER — HEPARIN LOCK FLUSH 100 UNIT/ML IV SOLN: INTRAVENOUS | 2 refills | Status: DC

## 2016-07-04 MED ORDER — NORMAL SALINE FLUSH 0.9 % IV SOLN: INTRAVENOUS | 2 refills | Status: DC

## 2016-07-04 NOTE — Assessment & Plan Note (Signed)
Iron deficiency anemia, requiring IV iron replacement when indicated.  Labs today: CBC diff, ferritin.  I personally reviewed and went over laboratory results with the patient.  The results are noted within this dictation.  Labs in 3 months: CBC diff, CMET, iron/TIBC, ferritin

## 2016-07-04 NOTE — Patient Instructions (Addendum)
[place] at [facility] Discharge Instructions  RECOMMENDATIONS MADE BY THE CONSULTANT AND ANY TEST RESULTS WILL BE SENT TO YOUR REFERRING PHYSICIAN.   Labs in 3 months & return for follow up in 3 months  Refer to Dr. Gala Romney for colonoscopy    Thank you for choosing [place] at [facility] to provide your oncology and hematology care.  To afford each patient quality time with our provider, please arrive at least 15 minutes before your scheduled appointment time.   Beginning January 23rd 2017 lab work for the [facility] will be done in the  Main lab at [facility] on 1st floor. If you have a lab appointment with the [facility] please come in thru the  Main Entrance and check in at the main information desk  You need to re-schedule your appointment should you arrive 10 or more minutes late.  We strive to give you quality time with our providers, and arriving late affects you and other patients whose appointments are after yours.  Also, if you no show three or more times for appointments you may be dismissed from the clinic at the providers discretion.     Again, thank you for choosing [facility].  Our hope is that these requests will decrease the amount of time that you wait before being seen by our physicians.       _____________________________________________________________  Should you have questions after your visit to [facility], please contact our office at [phone] between the hours of 8:30 a.m. and 4:30 p.m.  Voicemails left after 4:30 p.m. will not be returned until the following business day.  For prescription refill requests, have your pharmacy contact our office.         Resources For Cancer Patients and their Caregivers ? American Cancer Society: Can assist with transportation, wigs, general needs, runs Look Good Feel Better.        959-772-4466 ? Cancer Care: Provides financial  assistance, online support groups, medication/co-pay assistance.  1-800-813-HOPE 425-240-8105) ? [facility] Assists [place] cancer patients and their families through emotional , educational and financial support.  [phone] ? [place] DSS Where to apply for food stamps, Medicaid and utility assistance. 971-110-5072 ? RCATS: Transportation to medical appointments. 458 018 6032 ? Social Security Administration: May apply for disability if have a Stage IV cancer. [phone] ? [employer], Disability and Transit Services: Assists with nutrition, care and transit needs. [place] Support Programs: @10RELATIVEDAYS @ > Cancer Support Group  2nd Tuesday of the month 1pm-2pm, Journey Room  > Creative Journey  3rd Tuesday of the month 1130am-1pm, Journey Room  > Look Good Feel Better  1st Wednesday of the month 10am-12 noon, Journey Room (Call [place] to register [phone])

## 2016-07-04 NOTE — Assessment & Plan Note (Addendum)
Stage IIC (T4BN0M0) adenocarcinoma of colon, with biopsy of questionable liver lesion that was negative for malignancy.  Oncology history is up to date.  Labs today: CBC diff, ferritin, cea.  I personally reviewed and went over laboratory results with the patient.  The results are noted within this dictation.  Labs in 3 months: CBC diff, CMET, iron/TIBC, ferritin, CEA.  She has a stool softener on order PRN.  I have asked her to request a stool softener daily as needed.  I personally reviewed and went over radiographic studies with the patient.  The results are noted within this dictation.  Mammogram was negative in October 2017.  She is due for annual imaging in August 2018.  Sooner if clinically indicated.  Return in 3 months for follow-up.  Will refer to GI in preparation for colonoscopy within the first year following colon cancer treatment.

## 2016-07-04 NOTE — Progress Notes (Signed)
[name], MD [address] [place] [address]  Adenocarcinoma of colon [facility]) - Plan: Heparin Lock Flush (HEPARIN FLUSH, PORCINE,) 100 UNIT/ML injection, Sodium Chloride Flush (NORMAL SALINE FLUSH) 0.9 % SOLN  Iron deficiency anemia due to chronic blood loss  CURRENT THERAPY: IV iron when indicated  INTERVAL HISTORY: Mary Wells 60 y.o. female returns for followup of Stage IIC (T4BN0M0) adenocarcinoma of colon, with biopsy of questionable liver lesion that was negative for malignancy. AND iron deficiency anemia with no evidence of bone marrow disorder or hemoglobinopathy AND Noncompliance    Adenocarcinoma of colon ([place])   01/25/2016 Imaging    CT C/A/P large 9 cm mass within the colon at level of hepatic flexure, enlarged mesenteric LN concerning for metastatic disease, There is a nonspecific 13 mm nodule within the right hemipelvis which may represent a metastatic deposit. Alternatively, given the appearance, this may represent a small amount of loculated fluid. There are nonspecific low-attenuation lesions within the liver, the majority which are subcentimeter in size. Metastatic disease is not excluded.       01/25/2016 Procedure    Colonoscopy on 6/22 with Large fungating, exophytic tumor appears to be arising out of the base of cecum / appendix. It's partially obscuring the ileocecal valve.      01/29/2016 Surgery    R hemicolectomy with resection of terminal ileum, liver biopsy with Dr. Arnoldo Morale      01/29/2016 Pathology Results    INVASIVE WELL DIFFERENTIATED ADENOCARCINOMA WITH ABUNDANT EXTRACELLULAR MUCIN, SPANNING 19 CM IN GREATEST DIMENSION. - TUMOR INVADES THROUGH MUSCULARIS PROPRIA THROUGH THE SEROSA OF ONE BOWEL SEGMENT INTO A SECOND ADHESED SEGMENT OF BOWEL. - MARGINS ARE NEGATIVE.No extramural satellite tumor nodules seen, 0/27 LN, Liver biopsy with benign liver tissue      03/15/2016 PET scan    No abnormal metabolic activity  status post interval right hemicolectomy. No evidence of residual or metastatic disease. The right upper lobe pulmonary nodule is stable and without hypermetabolic activity. This favors a benign etiology.       She reports that she feels well.  She denies any rectal bleeding or blood in her stool.  She denies any abdominal pain.  She reports moving her bowel 2-3 times per week.  She is unclear about her usual bowel habits.    I reviewed the NCCN guidelines pertaining to surveillance for colon cancer.    Review of Systems  Constitutional: Negative.  Negative for chills, fever, malaise/fatigue and weight loss.  HENT: Negative.   Eyes: Negative.   Respiratory: Negative.  Negative for cough.   Cardiovascular: Negative.   Gastrointestinal: Negative.  Negative for abdominal pain, blood in stool, constipation, diarrhea, melena, nausea and vomiting.  Musculoskeletal: Negative.   Skin: Negative.   Neurological: Negative.  Negative for weakness.  Endo/Heme/Allergies: Negative.   Psychiatric/Behavioral: Negative.     Past Medical History:  Diagnosis Date  . Adenocarcinoma of colon ([place])   . Allergy   . Diabetes mellitus without complication ([place]) 123456  . GERD (gastroesophageal reflux disease)   . Iron deficiency anemia 09/18/2013  . Schizophrenia [facility])     Past Surgical History:  Procedure Laterality Date  . BIOPSY  01/24/2016   Procedure: BIOPSY;  Surgeon: Daneil Dolin, MD;  Location: [facility] ENDO SUITE;  Service: Endoscopy;;  cecal mass  . COLONOSCOPY  04/21/2006   SLF: limited colonoscopy due to a poor bowel prep   . COLONOSCOPY  02/22/2009   SLF: poor  bowel prep/Formed stools in the right colon which limited the extent of the exam/The scope was passed to approximately the proximal transverse colon.  No polyps, masses, inflammatory changes, diverticular/normal rectum  . COLONOSCOPY WITH PROPOFOL N/A 01/24/2016   Procedure: COLONOSCOPY WITH PROPOFOL;  Surgeon: Daneil Dolin, MD;   Location: [facility] ENDO SUITE;  Service: Endoscopy;  Laterality: N/A;  . EXCISIONAL HEMORRHOIDECTOMY    . LIVER BIOPSY N/A 01/29/2016   Procedure: LIVER BIOPSY;  Surgeon: Aviva Signs, MD;  Location: [facility] ORS;  Service: General;  Laterality: N/A;  . PARTIAL COLECTOMY N/A 01/29/2016   Procedure: PARTIAL COLECTOMY ;  Surgeon: Aviva Signs, MD;  Location: [facility] ORS;  Service: General;  Laterality: N/A;  . PORTACATH PLACEMENT Left 01/29/2016   Procedure: INSERTION PORT-A-CATH;  Surgeon: Aviva Signs, MD;  Location: [facility] ORS;  Service: General;  Laterality: Left;  left subclavian    Family History  Problem Relation Age of Onset  . Leukemia Paternal Grandfather   . Leukemia Cousin   . Colon cancer Neg Hx     Social History   Social History  . Marital status: Single    Spouse name: N/A  . Number of children: N/A  . Years of education: N/A   Social History Main Topics  . Smoking status: Former Research scientist (life sciences)  . Smokeless tobacco: Never Used     Comment: quit 10-15 years  . Alcohol use No  . Drug use: No  . Sexual activity: No   Other Topics Concern  . Not on file   Social History Narrative  . No narrative on file     PHYSICAL EXAMINATION  ECOG PERFORMANCE STATUS: 1 - Symptomatic but completely ambulatory  Vitals:   07/04/16 1437  BP: 94/75  Pulse: (!) 56  Resp: 20  Temp: 97.3 F (36.3 C)    GENERAL:alert, no distress, well nourished, well developed, comfortable, cooperative, smiling and accompanied by staff at her home facility SKIN: skin color, texture, turgor are normal, no rashes or significant lesions HEAD: Normocephalic, No masses, lesions, tenderness or abnormalities EYES: normal, EOMI, Conjunctiva are pink and non-injected EARS: External ears normal OROPHARYNX:lips, buccal mucosa, and tongue normal and mucous membranes are moist  NECK: supple, trachea midline LYMPH:  no palpable lymphadenopathy BREAST:not examined LUNGS: clear to auscultation  HEART: regular rate & rhythm, no  murmurs and no gallops ABDOMEN:abdomen soft and normal bowel sounds BACK: Back symmetric, no curvature. EXTREMITIES:less then 2 second capillary refill, no joint deformities, effusion, or inflammation, no skin discoloration, no cyanosis  NEURO: alert & oriented x 3 with fluent speech, no focal motor/sensory deficits, gait normal   LABORATORY DATA: CBC    Component Value Date/Time   WBC 5.6 07/04/2016 1325   RBC 4.93 07/04/2016 1325   HGB 12.4 07/04/2016 1325   HCT 39.0 07/04/2016 1325   PLT 142 (L) 07/04/2016 1325   MCV 79.1 07/04/2016 1325   MCH 25.2 (L) 07/04/2016 1325   MCHC 31.8 07/04/2016 1325   RDW 16.7 (H) 07/04/2016 1325   LYMPHSABS 2.6 07/04/2016 1325   MONOABS 0.4 07/04/2016 1325   EOSABS 0.1 07/04/2016 1325   BASOSABS 0.0 07/04/2016 1325      Chemistry      Component Value Date/Time   NA 138 03/01/2016 1503   K 4.5 03/01/2016 1503   CL 107 03/01/2016 1503   CO2 25 03/01/2016 1503   BUN 11 03/01/2016 1503   CREATININE 0.75 03/01/2016 1503      Component Value Date/Time   CALCIUM  9.5 03/01/2016 1503   ALKPHOS 92 03/01/2016 1503   AST 33 03/01/2016 1503   ALT 17 03/01/2016 1503   BILITOT 0.2 (L) 03/01/2016 1503      Lab Results  Component Value Date   IRON 14 (L) 01/25/2016   TIBC 165 (L) 01/25/2016   FERRITIN 77 04/03/2016   Lab Results  Component Value Date   CEA 4.7 04/03/2016    PENDING LABS:   RADIOGRAPHIC STUDIES:  No results found.   PATHOLOGY:    ASSESSMENT AND PLAN:  Adenocarcinoma of colon ([place]) Stage IIC (T4BN0M0) adenocarcinoma of colon, with biopsy of questionable liver lesion that was negative for malignancy.  Oncology history is up to date.  Labs today: CBC diff, ferritin, cea.  I personally reviewed and went over laboratory results with the patient.  The results are noted within this dictation.  Labs in 3 months: CBC diff, CMET, iron/TIBC, ferritin, CEA.  She has a stool softener on order PRN.  I have asked her to  request a stool softener daily as needed.  I personally reviewed and went over radiographic studies with the patient.  The results are noted within this dictation.  Mammogram was negative in October 2017.  She is due for annual imaging in August 2018.  Sooner if clinically indicated.  Return in 3 months for follow-up.  Will refer to GI in preparation for colonoscopy within the first year following colon cancer treatment.  Iron deficiency anemia Iron deficiency anemia, requiring IV iron replacement when indicated.  Labs today: CBC diff, ferritin.  I personally reviewed and went over laboratory results with the patient.  The results are noted within this dictation.  Labs in 3 months: CBC diff, CMET, iron/TIBC, ferritin   ORDERS PLACED FOR THIS ENCOUNTER: No orders of the defined types were placed in this encounter.   MEDICATIONS PRESCRIBED THIS ENCOUNTER: Meds ordered this encounter  Medications  . Heparin Lock Flush (HEPARIN FLUSH, PORCINE,) 100 UNIT/ML injection    Sig: Flush port with 10cc of normal saline then flush with 6ml (500 units of hep lock flush) every 6 weeks.    Dispense:  10 Syringe    Refill:  2  . Sodium Chloride Flush (NORMAL SALINE FLUSH) 0.9 % SOLN    Sig: Flush port with 10cc of normal saline then flush with 61ml (500 units of hep lock flush) every 6 weeks.    Dispense:  10 Syringe    Refill:  2    THERAPY PLAN:  Ongoing monitoring of blood counts and iron studies.    NCCN guidelines for surveillance for Colon cancer are as follows (1.2017):  A. Stage I   1. Colonoscopy at year 1    A. If advanced adenoma, repeat in 1 year    B. If no advanced adenoma, repeat in 3 years, and then every 5 years.  B. Stage II, Stage III   1. H+P every 3-6 months x 2 years and then every 6 months for a total of 5 years    2. CEA every 3-6 months x 2 years and then every 6 months for a total of 5 years    3. CT CAP every 6-12 months (category 2B for frequency < 12 months) for  a total of 5 years .   4.  Colonoscopy in 1 year except if no preoperative colonoscopy due to obstructing lesion, colonoscopy in 3-6 months.     A. If advanced adenoma, repeat in 1 year    B. If  no advanced adenoma, repeat in 3 years, then every 5 years   5. PET/CT scan is not recommended.  C. Stage IV   1. H+P every 3-6 months x 2 years and then every 6 months for a total of 5 years    2. CEA every 3 months x 2 years and then every 6 months for a total of 3- 5 years    3. CT CAP every 3-6 months (category 2B for frequency < 6 months) x 2 years., then every 6-12 months for a total of 5 years .   4. Colonoscopy in 1 year except if no preoperative colonoscopy due to obstructing lesion, colonoscopy in 3-6 months.     A. If advanced adenoma, repeat in 1 year    B. If no advanced adenoma, repeat in 3 years, then every 5 years   All questions were answered. The patient knows to call the clinic with any problems, questions or concerns. We can certainly see the patient much sooner if necessary.  Patient and plan discussed with Dr. Ancil Linsey and she is in agreement with the aforementioned.   This note is electronically signed by: Doy Mince 07/04/2016 5:46 PM

## 2016-07-05 LAB — CEA: CEA: 3.8 ng/mL (ref 0.0–4.7)

## 2016-07-05 NOTE — Addendum Note (Signed)
Addended by: Gerhard Perches on: 07/05/2016 11:31 AM   Modules accepted: Orders

## 2016-07-12 ENCOUNTER — Encounter (HOSPITAL_COMMUNITY): Payer: Medicaid Other

## 2016-07-23 ENCOUNTER — Encounter (HOSPITAL_COMMUNITY): Payer: Self-pay

## 2016-07-23 ENCOUNTER — Encounter (HOSPITAL_COMMUNITY): Payer: Medicaid Other | Attending: Oncology

## 2016-07-23 VITALS — BP 141/60 | HR 58 | Temp 98.1°F | Resp 16

## 2016-07-23 DIAGNOSIS — Z452 Encounter for adjustment and management of vascular access device: Secondary | ICD-10-CM | POA: Diagnosis present

## 2016-07-23 DIAGNOSIS — C182 Malignant neoplasm of ascending colon: Secondary | ICD-10-CM | POA: Diagnosis not present

## 2016-07-23 DIAGNOSIS — C189 Malignant neoplasm of colon, unspecified: Secondary | ICD-10-CM | POA: Insufficient documentation

## 2016-07-23 MED ORDER — HEPARIN SOD (PORK) LOCK FLUSH 100 UNIT/ML IV SOLN
500.0000 [IU] | Freq: Once | INTRAVENOUS | Status: AC
  Administered 2016-07-23: 500 [IU] via INTRAVENOUS

## 2016-07-23 MED ORDER — SODIUM CHLORIDE 0.9% FLUSH
20.0000 mL | INTRAVENOUS | Status: DC | PRN
  Administered 2016-07-23: 20 mL via INTRAVENOUS
  Filled 2016-07-23: qty 20

## 2016-07-23 MED ORDER — HEPARIN SOD (PORK) LOCK FLUSH 100 UNIT/ML IV SOLN
INTRAVENOUS | Status: AC
  Filled 2016-07-23: qty 5

## 2016-07-23 NOTE — Patient Instructions (Signed)
[place] at [facility] Discharge Instructions  RECOMMENDATIONS MADE BY THE CONSULTANT AND ANY TEST RESULTS WILL BE SENT TO YOUR REFERRING PHYSICIAN.  Port flushed today   Thank you for choosing [place] at [facility] to provide your oncology and hematology care.  To afford each patient quality time with our provider, please arrive at least 15 minutes before your scheduled appointment time.   Beginning January 23rd 2017 lab work for the [facility] will be done in the  Main lab at [facility] on 1st floor. If you have a lab appointment with the [facility] please come in thru the  Main Entrance and check in at the main information desk  You need to re-schedule your appointment should you arrive 10 or more minutes late.  We strive to give you quality time with our providers, and arriving late affects you and other patients whose appointments are after yours.  Also, if you no show three or more times for appointments you may be dismissed from the clinic at the providers discretion.     Again, thank you for choosing [facility].  Our hope is that these requests will decrease the amount of time that you wait before being seen by our physicians.       _____________________________________________________________  Should you have questions after your visit to [facility], please contact our office at [phone] between the hours of 8:30 a.m. and 4:30 p.m.  Voicemails left after 4:30 p.m. will not be returned until the following business day.  For prescription refill requests, have your pharmacy contact our office.         Resources For Cancer Patients and their Caregivers ? American Cancer Society: Can assist with transportation, wigs, general needs, runs Look Good Feel Better.        [phone] ? Cancer Care: Provides financial assistance, online support groups, medication/co-pay assistance.   1-800-813-HOPE 858-571-7380) ? [facility] Assists [place] cancer patients and their families through emotional , educational and financial support.  856-192-5722 ? [place] DSS Where to apply for food stamps, Medicaid and utility assistance. 438 013 5422 ? RCATS: Transportation to medical appointments. [phone] ? Social Security Administration: May apply for disability if have a Stage IV cancer. 781-234-1861 913-657-3180 ? [employer], Disability and Transit Services: Assists with nutrition, care and transit needs. [place] Support Programs: @10RELATIVEDAYS @ > Cancer Support Group  2nd Tuesday of the month 1pm-2pm, Journey Room  > Creative Journey  3rd Tuesday of the month 1130am-1pm, Journey Room  > Look Good Feel Better  1st Wednesday of the month 10am-12 noon, Journey Room (Call [place] to register [phone])

## 2016-07-23 NOTE — Progress Notes (Signed)
Mary Wells presented for Portacath access and flush. Proper placement of portacath confirmed by CXR. Portacath located left chest wall accessed with  H 20 needle. Good blood return present. Portacath flushed with 75ml NS and 500U/84ml Heparin and needle removed intact. Procedure without incident. Patient tolerated procedure well.

## 2016-10-08 ENCOUNTER — Other Ambulatory Visit (HOSPITAL_COMMUNITY): Payer: Self-pay | Admitting: *Deleted

## 2016-10-08 DIAGNOSIS — C189 Malignant neoplasm of colon, unspecified: Secondary | ICD-10-CM

## 2016-10-09 ENCOUNTER — Other Ambulatory Visit (HOSPITAL_COMMUNITY): Payer: Medicaid Other

## 2016-10-09 ENCOUNTER — Ambulatory Visit (HOSPITAL_COMMUNITY): Payer: Medicaid Other | Admitting: Hematology & Oncology

## 2016-10-09 ENCOUNTER — Ambulatory Visit (HOSPITAL_COMMUNITY): Payer: Medicaid Other | Admitting: Adult Health

## 2016-10-30 NOTE — Progress Notes (Addendum)
Mary Wells, [place] [address]   CLINIC:  Medical Oncology/Hematology  PCP:  Rosita Fire, MD [address] [place] [address] [phone]   REASON FOR VISIT:  Follow-up for Stage IIC (T4bN0M0) adenocarcinoma of colon AND iron deficiency anemia   CURRENT THERAPY: Surveillance AND IV iron PRN    BRIEF ONCOLOGIC HISTORY:    Adenocarcinoma of colon ([place])   01/25/2016 Imaging    CT C/A/P large 9 cm mass within the colon at level of hepatic flexure, enlarged mesenteric LN concerning for metastatic disease, There is a nonspecific 13 mm nodule within the right hemipelvis which may represent a metastatic deposit. Alternatively, given the appearance, this may represent a small amount of loculated fluid. There are nonspecific low-attenuation lesions within the liver, the majority which are subcentimeter in size. Metastatic disease is not excluded.       01/25/2016 Procedure    Colonoscopy on 6/22 with Large fungating, exophytic tumor appears to be arising out of the base of cecum / appendix. It's partially obscuring the ileocecal valve.      01/29/2016 Surgery    R hemicolectomy with resection of terminal ileum, liver biopsy with Dr. Arnoldo Morale      01/29/2016 Pathology Results    INVASIVE WELL DIFFERENTIATED ADENOCARCINOMA WITH ABUNDANT EXTRACELLULAR MUCIN, SPANNING 19 CM IN GREATEST DIMENSION. - TUMOR INVADES THROUGH MUSCULARIS PROPRIA THROUGH THE SEROSA OF ONE BOWEL SEGMENT INTO A SECOND ADHESED SEGMENT OF BOWEL. - MARGINS ARE NEGATIVE.No extramural satellite tumor nodules seen, 0/27 LN, Liver biopsy with benign liver tissue      03/15/2016 PET scan    No abnormal metabolic activity status post interval right hemicolectomy. No evidence of residual or metastatic disease. The right upper lobe pulmonary nodule is stable and without hypermetabolic activity. This favors a benign etiology.        HISTORY OF PRESENT ILLNESS:  (From  Mary Crigler, PA-C's last note on 07/04/16)     INTERVAL HISTORY:  Ms. Mary Wells 61 y.o. female presents for continued follow-up for Stage IIC colon cancer and iron deficiency anemia.   She is here today with an employee from her living facility.  Overall, she reports feeling well. She is largely without complaints today, with the exception of itching to her abdomen.  Denies any blood in her stool, hematuria, or vaginal bleeding. Her appetite is great; energy levels are good as well. She enjoys playing card games at the facility where she lives.   IV iron administration record:  Oncology Flowsheet 10/13/2015  ferric carboxymaltose (INJECTAFER) IV 750 mg     REVIEW OF SYSTEMS: (portions of ROS difficult given patient's cognitive deficit)  Review of Systems  Constitutional: Negative.  Negative for chills, fatigue and fever.  HENT:  Negative.   Eyes: Negative.   Respiratory: Negative.  Negative for cough and shortness of breath.   Cardiovascular: Negative.  Negative for chest pain.  Gastrointestinal: Negative.  Negative for abdominal pain, blood in stool, constipation, diarrhea, nausea and vomiting.  Endocrine: Negative.   Genitourinary: Negative.  Negative for dysuria, hematuria and vaginal bleeding.   Musculoskeletal: Negative.   Skin: Positive for itching and rash.  Neurological: Negative.   Hematological: Negative.   Psychiatric/Behavioral: Negative.      PAST MEDICAL/SURGICAL HISTORY:  Past Medical History:  Diagnosis Date  . Adenocarcinoma of colon ([place])   . Allergy   . Diabetes mellitus without complication ([place]) 9379  . GERD (gastroesophageal reflux disease)   . Iron deficiency anemia  09/18/2013  . Schizophrenia [facility])    Past Surgical History:  Procedure Laterality Date  . BIOPSY  01/24/2016   Procedure: BIOPSY;  Surgeon: Daneil Dolin, MD;  [facility];  Service: Endoscopy;;  cecal mass  . COLONOSCOPY  04/21/2006   SLF: limited colonoscopy due to a poor  bowel prep   . COLONOSCOPY  02/22/2009   SLF: poor bowel prep/Formed stools in the right colon which limited the extent of the exam/The scope was passed to approximately the proximal transverse colon.  No polyps, masses, inflammatory changes, diverticular/normal rectum  . COLONOSCOPY WITH PROPOFOL N/A 01/24/2016   Procedure: COLONOSCOPY WITH PROPOFOL;  Surgeon: Daneil Dolin, MD;  Location: [facility];  Service: Endoscopy;  Laterality: N/A;  . EXCISIONAL HEMORRHOIDECTOMY    . LIVER BIOPSY N/A 01/29/2016   Procedure: LIVER BIOPSY;  Surgeon: Aviva Signs, MD;  Location: [facility] ORS;  Service: General;  Laterality: N/A;  . PARTIAL COLECTOMY N/A 01/29/2016   Procedure: PARTIAL COLECTOMY ;  Surgeon: Aviva Signs, MD;  Location: [facility] ORS;  Service: General;  Laterality: N/A;  . PORTACATH PLACEMENT Left 01/29/2016   Procedure: INSERTION PORT-A-CATH;  Surgeon: Aviva Signs, MD;  Location: [facility] ORS;  Service: General;  Laterality: Left;  left subclavian     SOCIAL HISTORY:  Social History   Social History  . Marital status: Single    Spouse name: N/A  . Number of children: N/A  . Years of education: N/A   Occupational History  . Not on file.   Social History Main Topics  . Smoking status: Former Research scientist (life sciences)  . Smokeless tobacco: Never Used     Comment: quit 10-15 years  . Alcohol use No  . Drug use: No  . Sexual activity: No   Other Topics Concern  . Not on file   Social History Narrative  . No narrative on file    FAMILY HISTORY:  Family History  Problem Relation Age of Onset  . Leukemia Paternal Grandfather   . Leukemia Cousin   . Colon cancer Neg Hx     CURRENT MEDICATIONS:  Outpatient Encounter Prescriptions as of 10/31/2016  Medication Sig  . benztropine (COGENTIN) 2 MG tablet Take 1 mg by mouth 2 (two) times daily.   . clonazePAM (KLONOPIN) 0.5 MG tablet Take 0.5 mg by mouth 2 (two) times daily as needed for anxiety (prn agitation).  Marland Kitchen divalproex (DEPAKOTE ER) 250 MG 24 hr tablet  Take 250 mg by mouth at bedtime.  . docusate sodium (COLACE) 100 MG capsule Take 100 mg by mouth daily as needed for mild constipation.  . famotidine (PEPCID) 20 MG tablet Take 20 mg by mouth daily.  . ferrous sulfate 325 (65 FE) MG tablet Take 325 mg by mouth 2 (two) times daily with a meal.  . fluPHENAZine (PROLIXIN) 5 MG tablet Take 5 mg by mouth 2 (two) times daily.  Marland Kitchen HYDROcodone-acetaminophen (NORCO/VICODIN) 5-325 MG tablet Take 1 tablet by mouth every 4 (four) hours as needed for moderate pain.  Marland Kitchen loratadine (CLARITIN) 10 MG tablet Take 10 mg by mouth daily.  . metFORMIN (GLUCOPHAGE) 500 MG tablet Take 500 mg by mouth 2 (two) times daily with a meal.   . NON FORMULARY Take 1 capsule by mouth 2 (two) times daily. Fiber capsules 520 mg.  . simvastatin (ZOCOR) 20 MG tablet Take 20 mg by mouth daily.   No facility-administered encounter medications on file as of 10/31/2016.     ALLERGIES:  No Known  Allergies   PHYSICAL EXAM:  ECOG Performance status: Difficult to determine; patient currently lives in facility for history of mental illness.   Vitals:   10/31/16 1500  BP: 113/61  Pulse: (!) 47  Resp: 16  Temp: 98.3 F (36.8 C)   Filed Weights   10/31/16 1500  Weight: 152 lb (68.9 kg)    Physical Exam  Constitutional: She is well-developed, well-nourished, and in no distress.  HENT:  Head: Normocephalic.  Mouth/Throat: Oropharynx is clear and moist. No oropharyngeal exudate.  Eyes: Conjunctivae are normal. Pupils are equal, round, and reactive to light. No scleral icterus.  Neck: Normal range of motion. Neck supple.  Cardiovascular: Regular rhythm and normal heart sounds.   Bradycardia   Pulmonary/Chest: Effort normal and breath sounds normal. No respiratory distress. She has no wheezes.  Abdominal: Soft. Bowel sounds are normal. She exhibits no distension. There is no tenderness. There is no rebound and no guarding.  Well-healed abdominal midline incision    Musculoskeletal: Normal range of motion. She exhibits no edema.  Lymphadenopathy:    She has no cervical adenopathy.  Neurological: She is alert. Gait normal.  Cognitive deficit   Skin: Skin is warm and dry. Rash (Excoriation noted to area adjacent to midline abd incisions; no distinct rash, only very dry skin and excoriation from fingernail scratches) noted.     LABORATORY DATA:  I have reviewed the labs as listed.  CBC    Component Value Date/Time   WBC 4.4 10/31/2016 1332   RBC 4.71 10/31/2016 1332   HGB 12.6 10/31/2016 1332   HCT 38.0 10/31/2016 1332   PLT 155 10/31/2016 1332   MCV 80.7 10/31/2016 1332   MCH 26.8 10/31/2016 1332   MCHC 33.2 10/31/2016 1332   RDW 15.1 10/31/2016 1332   LYMPHSABS 2.0 10/31/2016 1332   MONOABS 0.4 10/31/2016 1332   EOSABS 0.1 10/31/2016 1332   BASOSABS 0.0 10/31/2016 1332   CMP Latest Ref Rng & Units 03/01/2016 01/31/2016 01/30/2016  Glucose 65 - 99 mg/dL 88 161(H) 158(H)  BUN 6 - 20 mg/dL 11 5(L) 5(L)  Creatinine 0.44 - 1.00 mg/dL 0.75 0.59 0.64  Sodium 135 - 145 mmol/L 138 140 137  Potassium 3.5 - 5.1 mmol/L 4.5 3.6 3.7  Chloride 101 - 111 mmol/L 107 109 106  CO2 22 - 32 mmol/L 25 27 25   Calcium 8.9 - 10.3 mg/dL 9.5 8.3(L) 8.3(L)  Total Protein 6.5 - 8.1 g/dL 7.9 - -  Total Bilirubin 0.3 - 1.2 mg/dL 0.2(L) - -  Alkaline Phos 38 - 126 U/L 92 - -  AST 15 - 41 U/L 33 - -  ALT 14 - 54 U/L 17 - -     PENDING LABS:  CEA and ferritin pending    DIAGNOSTIC IMAGING:  PET scan: 03/15/16      PATHOLOGY:  Colon resection path: 01/29/16       ASSESSMENT & PLAN:   Stage IIC (T4bN0M0) adenocarcinoma of colon:  -Diagnosed in 01/2016; treated with right hemicolectomy with resection of terminal ileum; there was questionable liver lesion on imaging which was biopsied and negative for malignancy.  Post-surgery PET scan negative for residual or metastatic disease. There is notation of RUL pulmonary nodule that was stable and  non-hypermetabolic on PET in 03/1855; benign etiology is favored; will follow-up on upcoming CT chest in 01/2017. -CEA elevated at 38.8 at time of diagnosis; post-operative CEA levels have normalized.  Today's CEA pending.  -NCCN Guidelines reviewed; difficult to ascertain patient's  complete understanding given her cognitive deficit. She will be due for annual CT chest/abd/pelvis imaging in 01/2017. Orders placed today.  -Due for colonoscopy with Dr. Gala Romney in 01/2017; will re-refer back to GI for colonoscopy consideration.     Iron deficiency anemia:  -Ferritin pending. Last dose IV iron was 10/2015.   Dry skin with pruritis/skin excoriation:  -Recommended patient apply additional moisturizer to her skin, particularly on the abdomen where she has excoriation. Written recommendations provided for the patient's nursing facility.       Dispo:  -CT chest/abd/pelvis due in 01/2017; orders placed today.  -Refer back to GI for colonoscopy consideration with Dr. Gala Romney. -Return to cancer center in 01/2017 after restaging CT scans completed for follow-up visit.    All questions were answered to patient's stated satisfaction. Encouraged patient to call with any new concerns or questions before her next visit to the cancer center and we can certain see her sooner, if needed.        Orders placed this encounter:  Orders Placed This Encounter  Procedures  . CT ABDOMEN PELVIS W CONTRAST  . CT CHEST [place], NP [place] [phone]

## 2016-10-31 ENCOUNTER — Encounter (HOSPITAL_COMMUNITY): Payer: Medicaid Other | Attending: Oncology

## 2016-10-31 ENCOUNTER — Encounter (HOSPITAL_COMMUNITY): Payer: Self-pay | Admitting: Adult Health

## 2016-10-31 ENCOUNTER — Encounter (HOSPITAL_BASED_OUTPATIENT_CLINIC_OR_DEPARTMENT_OTHER): Payer: Medicaid Other | Admitting: Adult Health

## 2016-10-31 VITALS — BP 113/61 | HR 47 | Temp 98.3°F | Resp 16 | Wt 152.0 lb

## 2016-10-31 DIAGNOSIS — Z85038 Personal history of other malignant neoplasm of large intestine: Secondary | ICD-10-CM | POA: Diagnosis present

## 2016-10-31 DIAGNOSIS — E119 Type 2 diabetes mellitus without complications: Secondary | ICD-10-CM | POA: Insufficient documentation

## 2016-10-31 DIAGNOSIS — Z87891 Personal history of nicotine dependence: Secondary | ICD-10-CM | POA: Insufficient documentation

## 2016-10-31 DIAGNOSIS — D500 Iron deficiency anemia secondary to blood loss (chronic): Secondary | ICD-10-CM

## 2016-10-31 DIAGNOSIS — K219 Gastro-esophageal reflux disease without esophagitis: Secondary | ICD-10-CM | POA: Insufficient documentation

## 2016-10-31 DIAGNOSIS — D509 Iron deficiency anemia, unspecified: Secondary | ICD-10-CM | POA: Diagnosis not present

## 2016-10-31 DIAGNOSIS — Z7984 Long term (current) use of oral hypoglycemic drugs: Secondary | ICD-10-CM | POA: Insufficient documentation

## 2016-10-31 DIAGNOSIS — C189 Malignant neoplasm of colon, unspecified: Secondary | ICD-10-CM | POA: Diagnosis not present

## 2016-10-31 DIAGNOSIS — Z08 Encounter for follow-up examination after completed treatment for malignant neoplasm: Secondary | ICD-10-CM | POA: Insufficient documentation

## 2016-10-31 DIAGNOSIS — L299 Pruritus, unspecified: Secondary | ICD-10-CM

## 2016-10-31 LAB — CBC WITH DIFFERENTIAL/PLATELET
Basophils Absolute: 0 10*3/uL (ref 0.0–0.1)
Basophils Relative: 1 %
EOS PCT: 2 %
Eosinophils Absolute: 0.1 10*3/uL (ref 0.0–0.7)
HCT: 38 % (ref 36.0–46.0)
Hemoglobin: 12.6 g/dL (ref 12.0–15.0)
LYMPHS PCT: 44 %
Lymphs Abs: 2 10*3/uL (ref 0.7–4.0)
MCH: 26.8 pg (ref 26.0–34.0)
MCHC: 33.2 g/dL (ref 30.0–36.0)
MCV: 80.7 fL (ref 78.0–100.0)
MONOS PCT: 9 %
Monocytes Absolute: 0.4 10*3/uL (ref 0.1–1.0)
Neutro Abs: 2 10*3/uL (ref 1.7–7.7)
Neutrophils Relative %: 44 %
PLATELETS: 155 10*3/uL (ref 150–400)
RBC: 4.71 MIL/uL (ref 3.87–5.11)
RDW: 15.1 % (ref 11.5–15.5)
WBC: 4.4 10*3/uL (ref 4.0–10.5)

## 2016-10-31 LAB — FERRITIN: FERRITIN: 79 ng/mL (ref 11–307)

## 2016-10-31 NOTE — Patient Instructions (Signed)
[place] at [facility] Discharge Instructions  RECOMMENDATIONS MADE BY THE CONSULTANT AND ANY TEST RESULTS WILL BE SENT TO YOUR REFERRING PHYSICIAN.  You were seen today by Mike Craze NP.  CT scan in June.  Return after scan for labs and follow up.   Thank you for choosing [place] at [facility] to provide your oncology and hematology care.  To afford each patient quality time with our provider, please arrive at least 15 minutes before your scheduled appointment time.    If you have a lab appointment with the [facility] please come in thru the  Main Entrance and check in at the main information desk  You need to re-schedule your appointment should you arrive 10 or more minutes late.  We strive to give you quality time with our providers, and arriving late affects you and other patients whose appointments are after yours.  Also, if you no show three or more times for appointments you may be dismissed from the clinic at the providers discretion.     Again, thank you for choosing [facility].  Our hope is that these requests will decrease the amount of time that you wait before being seen by our physicians.       _____________________________________________________________  Should you have questions after your visit to [facility], please contact our office at [phone] between the hours of 8:30 a.m. and 4:30 p.m.  Voicemails left after 4:30 p.m. will not be returned until the following business day.  For prescription refill requests, have your pharmacy contact our office.       Resources For Cancer Patients and their Caregivers ? American Cancer Society: Can assist with transportation, wigs, general needs, runs Look Good Feel Better.        423-636-5745 ? Cancer Care: Provides financial assistance, online support groups, medication/co-pay assistance.  1-800-813-HOPE 228-703-9387) ? [facility] Assists [place] cancer patients and their families through emotional , educational and financial support.  631-565-6153 ? [place] DSS Where to apply for food stamps, Medicaid and utility assistance. (218)102-5320 ? [facility]: Transportation to medical appointments. (435)341-7209 ? Social Security Administration: May apply for disability if have a Stage IV cancer. 978-841-8228 716-452-0343 ? [employer], Disability and Transit Services: Assists with nutrition, care and transit needs. [place] Support Programs: @10RELATIVEDAYS @ > Cancer Support Group  2nd Tuesday of the month 1pm-2pm, Journey Room  > Creative Journey  3rd Tuesday of the month 1130am-1pm, Journey Room  > Look Good Feel Better  1st Wednesday of the month 10am-12 noon, Journey Room (Call [place] to register [phone])

## 2016-11-01 LAB — CEA: CEA: 3.8 ng/mL (ref 0.0–4.7)

## 2016-11-04 ENCOUNTER — Encounter: Payer: Self-pay | Admitting: Gastroenterology

## 2016-12-23 ENCOUNTER — Ambulatory Visit (INDEPENDENT_AMBULATORY_CARE_PROVIDER_SITE_OTHER): Payer: Medicaid Other | Admitting: Gastroenterology

## 2016-12-23 ENCOUNTER — Encounter: Payer: Self-pay | Admitting: Gastroenterology

## 2016-12-23 VITALS — BP 111/71 | HR 57 | Temp 96.6°F | Ht 64.0 in | Wt 156.2 lb

## 2016-12-23 DIAGNOSIS — C189 Malignant neoplasm of colon, unspecified: Secondary | ICD-10-CM

## 2016-12-23 NOTE — Progress Notes (Signed)
cc'ed to pcp °

## 2016-12-23 NOTE — Assessment & Plan Note (Signed)
Due for one year surveillance colonoscopy. Patient may best be served with bowel preparation in the hospital setting as we did before. She has failed bowel preparation twice in the past as outlined above. Will discuss further with Dr. Gala Romney and make arrangements as necessary.  I have discussed the risks, alternatives, benefits with regards to but not limited to the risk of reaction to medication, bleeding, infection, perforation and the patient is agreeable to proceed. Written consent to be obtained.

## 2016-12-23 NOTE — Progress Notes (Signed)
Primary Care Physician:  Rosita Fire, MD Referring Provider: Robynn Pane, PA-C Primary Gastroenterologist:  Garfield Cornea, MD   Chief Complaint  Patient presents with  . Colonoscopy    due for 1 yr tcs, no problems    HPI:  Mary Wells is a 61 y.o. female here For one-year surveillance colonoscopy. She has a history of stage IIc adenocarcinoma of the colon and iron deficiency anemia. CT of the abdomen and pelvis showed a 9 cm mass within the colon back in June 2017. Colonoscopy on June 21 showed large fungating, exophytic tumor arising out of the base of the cecum/appendix. Partially obscuring the ileocecal valve. She had a right hemicolectomy with resection of the terminal ileum, liver biopsy by Dr. Arnoldo Morale on 01/29/2016. Liver biopsy was benign. PET scan in August 2017 unremarkable.  Patient has h/o prior limited colonoscopies due to poor preps in 2007/2010. She was admitted for bowel preparation prior to her colonoscopy June 2017. Staff from facility had informed us that she may have eaten during the middle the night while they were asleep and would have access to food throughout the house. They do not have staffing a watch the patient 24 hours a day during her bowel preparation.  Patient presents today with a staff member from the home. Patient denies any bowel concerns. No blood in the stool or melena. History may be unreliable however. Staff member reports no concerns. She's had good appetite. Denies any abdominal pain. Patient is on iron. Patient reports dark stools.  Current Outpatient Prescriptions  Medication Sig Dispense Refill  . benztropine (COGENTIN) 2 MG tablet Take 1 mg by mouth 2 (two) times daily.     . clonazePAM (KLONOPIN) 0.5 MG tablet Take 0.5 mg by mouth 2 (two) times daily.     . divalproex (DEPAKOTE ER) 250 MG 24 hr tablet Take 250 mg by mouth at bedtime.    . docusate sodium (COLACE) 100 MG capsule Take 100 mg by mouth daily as needed for mild constipation.    .  famotidine (PEPCID) 20 MG tablet Take 20 mg by mouth daily.    . ferrous sulfate 325 (65 FE) MG tablet Take 325 mg by mouth 2 (two) times daily with a meal.    . fluPHENAZine (PROLIXIN) 5 MG tablet Take 5 mg by mouth 2 (two) times daily.    Marland Kitchen loratadine (CLARITIN) 10 MG tablet Take 10 mg by mouth daily.    . metFORMIN (GLUCOPHAGE) 500 MG tablet Take 500 mg by mouth 2 (two) times daily with a meal.     . NON FORMULARY Take 1 capsule by mouth 2 (two) times daily. Fiber capsules 520 mg.    . simvastatin (ZOCOR) 20 MG tablet Take 20 mg by mouth daily.     No current facility-administered medications for this visit.     Allergies as of 12/23/2016  . (No Known Allergies)    Past Medical History:  Diagnosis Date  . Adenocarcinoma of colon ([place])   . Allergy   . Diabetes mellitus without complication ([place]) 1517  . GERD (gastroesophageal reflux disease)   . Iron deficiency anemia 09/18/2013  . Schizophrenia [facility])     Past Surgical History:  Procedure Laterality Date  . BIOPSY  01/24/2016   Procedure: BIOPSY;  Surgeon: Daneil Dolin, MD;  [facility];  Service: Endoscopy;;  cecal mass  . COLONOSCOPY  04/21/2006   SLF: limited colonoscopy due to a poor bowel prep   . COLONOSCOPY  02/22/2009   SLF: poor bowel prep/Formed stools in the right colon which limited the extent of the exam/The scope was passed to approximately the proximal transverse colon.  No polyps, masses, inflammatory changes, diverticular/normal rectum  . COLONOSCOPY WITH PROPOFOL N/A 01/24/2016   Procedure: COLONOSCOPY WITH PROPOFOL;  Surgeon: Daneil Dolin, MD;  [facility];  Service: Endoscopy;  Laterality: N/A;  . EXCISIONAL HEMORRHOIDECTOMY    . LIVER BIOPSY N/A 01/29/2016   Procedure: LIVER BIOPSY;  Surgeon: Aviva Signs, MD;  Location: [facility] ORS;  Service: General;  Laterality: N/A;  . PARTIAL COLECTOMY N/A 01/29/2016   Procedure: PARTIAL COLECTOMY ;  Surgeon: Aviva Signs, MD;  Location: [facility] ORS;   Service: General;  Laterality: N/A;  . PORTACATH PLACEMENT Left 01/29/2016   Procedure: INSERTION PORT-A-CATH;  Surgeon: Aviva Signs, MD;  Location: [facility] ORS;  Service: General;  Laterality: Left;  left subclavian    Family History  Problem Relation Age of Onset  . Leukemia Paternal Grandfather   . Leukemia Cousin   . Colon cancer Neg Hx     Social History   Social History  . Marital status: Single    Spouse name: N/A  . Number of children: N/A  . Years of education: N/A   Occupational History  . Not on file.   Social History Main Topics  . Smoking status: Former Research scientist (life sciences)  . Smokeless tobacco: Never Used     Comment: quit 10-15 years  . Alcohol use No  . Drug use: No  . Sexual activity: No   Other Topics Concern  . Not on file   Social History Narrative  . No narrative on file      WCB:[id number] reliability  General: Negative for anorexia, weight loss, fever, chills, fatigue, weakness. Eyes: Negative for vision changes.  ENT: Negative for hoarseness, difficulty swallowing , nasal congestion. CV: Negative for chest pain, angina, palpitations, dyspnea on exertion, peripheral edema.  Respiratory: Negative for dyspnea at rest, dyspnea on exertion, cough, sputum, wheezing.  GI: See history of present illness. GU:  Negative for dysuria, hematuria, urinary incontinence, urinary frequency, nocturnal urination.  MS: Negative for joint pain, low back pain.  Derm: Negative for rash or itching.  Neuro: Negative for weakness, abnormal sensation, seizure, frequent headaches, memory loss, confusion.  Psych: Negative for anxiety, depression, suicidal ideation, hallucinations.  Endo: Negative for unusual weight change.  Heme: Negative for bruising or bleeding. Allergy: Negative for rash or hives.    Physical Examination:  BP 111/71   Pulse (!) 57   Temp (!) 96.6 F (35.9 C) (Oral)   Ht 5\' 4"  (1.626 m)   Wt 156 lb 3.2 oz (70.9 kg)   BMI 26.81 kg/m    General:  Well-nourished, well-developed in no acute distress.  Head: Normocephalic, atraumatic.   Eyes: Conjunctiva pink, no icterus. Mouth: Oropharyngeal mucosa moist and pink , no lesions erythema or exudate. Neck: Supple without thyromegaly, masses, or lymphadenopathy.  Lungs: Clear to auscultation bilaterally.  Heart: Regular rate and rhythm, no murmurs rubs or gallops.  Abdomen: Bowel sounds are normal, nontender, nondistended, no hepatosplenomegaly or masses, no abdominal bruits or    hernia , no rebound or guarding.   Rectal: Not performed Extremities: No lower extremity edema. No clubbing or deformities.  Neuro: Alert and oriented x 4 , grossly normal neurologically.  Skin: Warm and dry, no rash or jaundice.   Psych: Alert and cooperative, normal mood and affect.  Labs: Lab Results  Component Value Date  CREATININE 0.75 03/01/2016   BUN 11 03/01/2016   NA 138 03/01/2016   K 4.5 03/01/2016   CL 107 03/01/2016   CO2 25 03/01/2016   Lab Results  Component Value Date   WBC 4.4 10/31/2016   HGB 12.6 10/31/2016   HCT 38.0 10/31/2016   MCV 80.7 10/31/2016   PLT 155 10/31/2016   Lab Results  Component Value Date   ALT 17 03/01/2016   AST 33 03/01/2016   ALKPHOS 92 03/01/2016   BILITOT 0.2 (L) 03/01/2016   Lab Results  Component Value Date   FERRITIN 79 10/31/2016   Lab Results  Component Value Date   CEA 3.8 10/31/2016      Imaging Studies: No results found.

## 2016-12-23 NOTE — Patient Instructions (Signed)
1. Patient will be due one year follow up colonoscopy 01/2017. I will let you know if we will need to admit the day before her procedure to do bowel prep like we did last year.  2. Further recommendations to follow.

## 2017-01-26 NOTE — Progress Notes (Signed)
Discussed with RMR.  Plan for early afternoon TCS in OR. Bring patient to endo first thing (6:30) and given one gallon of Golytely follow by tap water enema once golytely completed.   She will need to stop iron one week before.  Day before TCS: metformin 500mg  am dose, no pm dose. Day of TCS: no metformin. Clear liquids starting after lunch TWO days before procedure like usual. NPO after midnight before TCS.

## 2017-01-28 ENCOUNTER — Other Ambulatory Visit: Payer: Self-pay

## 2017-01-28 DIAGNOSIS — C189 Malignant neoplasm of colon, unspecified: Secondary | ICD-10-CM

## 2017-01-28 MED ORDER — PEG 3350-KCL-NA BICARB-NACL 420 G PO SOLR: 4000.0000 mL | ORAL | 0 refills | Status: DC

## 2017-01-28 NOTE — Progress Notes (Signed)
NO PA is needed for TCS 

## 2017-01-28 NOTE — Progress Notes (Signed)
Pt is set up for TCS on 03/10/17 @ 1:00pm. Nursing Home know to have her at the hospital at 6:30 am on 03/10/17. Letter with instructions has been mailed.

## 2017-01-30 ENCOUNTER — Ambulatory Visit (HOSPITAL_COMMUNITY)
Admission: RE | Admit: 2017-01-30 | Discharge: 2017-01-30 | Disposition: A | Discharge status: Home / Self Care | Payer: Medicaid Other | Source: Ambulatory Visit | Attending: Adult Health | Admitting: Adult Health

## 2017-01-30 ENCOUNTER — Encounter (HOSPITAL_COMMUNITY): Payer: Self-pay

## 2017-01-30 DIAGNOSIS — Z9049 Acquired absence of other specified parts of digestive tract: Secondary | ICD-10-CM | POA: Insufficient documentation

## 2017-01-30 DIAGNOSIS — I700 Atherosclerosis of aorta: Secondary | ICD-10-CM | POA: Insufficient documentation

## 2017-01-30 DIAGNOSIS — C189 Malignant neoplasm of colon, unspecified: Secondary | ICD-10-CM | POA: Diagnosis present

## 2017-01-30 DIAGNOSIS — I288 Other diseases of pulmonary vessels: Secondary | ICD-10-CM | POA: Diagnosis not present

## 2017-01-30 DIAGNOSIS — I2510 Atherosclerotic heart disease of native coronary artery without angina pectoris: Secondary | ICD-10-CM | POA: Insufficient documentation

## 2017-01-30 DIAGNOSIS — R911 Solitary pulmonary nodule: Secondary | ICD-10-CM | POA: Diagnosis not present

## 2017-01-30 LAB — POCT I-STAT CREATININE: Creatinine, Ser: 0.9 mg/dL (ref 0.44–1.00)

## 2017-01-30 MED ORDER — IOPAMIDOL (ISOVUE-300) INJECTION 61%
100.0000 mL | Freq: Once | INTRAVENOUS | Status: AC | PRN
  Administered 2017-01-30: 100 mL via INTRAVENOUS

## 2017-02-03 ENCOUNTER — Other Ambulatory Visit (HOSPITAL_COMMUNITY): Payer: Medicaid Other

## 2017-02-03 ENCOUNTER — Ambulatory Visit (HOSPITAL_COMMUNITY): Payer: Medicaid Other

## 2017-02-19 ENCOUNTER — Encounter (HOSPITAL_COMMUNITY): Payer: Self-pay | Admitting: Adult Health

## 2017-02-19 ENCOUNTER — Encounter (HOSPITAL_COMMUNITY): Payer: Medicaid Other | Attending: Adult Health | Admitting: Adult Health

## 2017-02-19 ENCOUNTER — Encounter (HOSPITAL_COMMUNITY): Payer: Medicaid Other

## 2017-02-19 VITALS — BP 106/55 | HR 54 | Temp 97.7°F | Resp 16 | Wt 159.6 lb

## 2017-02-19 DIAGNOSIS — D696 Thrombocytopenia, unspecified: Secondary | ICD-10-CM | POA: Diagnosis not present

## 2017-02-19 DIAGNOSIS — D509 Iron deficiency anemia, unspecified: Secondary | ICD-10-CM

## 2017-02-19 DIAGNOSIS — C189 Malignant neoplasm of colon, unspecified: Secondary | ICD-10-CM | POA: Diagnosis present

## 2017-02-19 DIAGNOSIS — D500 Iron deficiency anemia secondary to blood loss (chronic): Secondary | ICD-10-CM | POA: Diagnosis not present

## 2017-02-19 DIAGNOSIS — R911 Solitary pulmonary nodule: Secondary | ICD-10-CM

## 2017-02-19 LAB — CBC WITH DIFFERENTIAL/PLATELET
BASOS PCT: 0 %
Basophils Absolute: 0 10*3/uL (ref 0.0–0.1)
EOS ABS: 0.1 10*3/uL (ref 0.0–0.7)
EOS PCT: 2 %
HCT: 38 % (ref 36.0–46.0)
Hemoglobin: 12.1 g/dL (ref 12.0–15.0)
LYMPHS ABS: 2.1 10*3/uL (ref 0.7–4.0)
Lymphocytes Relative: 43 %
MCH: 26.3 pg (ref 26.0–34.0)
MCHC: 31.8 g/dL (ref 30.0–36.0)
MCV: 82.6 fL (ref 78.0–100.0)
MONOS PCT: 9 %
Monocytes Absolute: 0.4 10*3/uL (ref 0.1–1.0)
NEUTROS PCT: 46 %
Neutro Abs: 2.2 10*3/uL (ref 1.7–7.7)
PLATELETS: 143 10*3/uL — AB (ref 150–400)
RBC: 4.6 MIL/uL (ref 3.87–5.11)
RDW: 15.5 % (ref 11.5–15.5)
WBC: 4.8 10*3/uL (ref 4.0–10.5)

## 2017-02-19 LAB — FERRITIN: FERRITIN: 82 ng/mL (ref 11–307)

## 2017-02-19 NOTE — Progress Notes (Addendum)
[place], [place] [address]   CLINIC:  Medical Oncology/Hematology  PCP:  Rosita Fire, MD [place] [address] [phone]   REASON FOR VISIT:  Follow-up for Stage IIC (T4bN0M0) adenocarcinoma of colon AND iron deficiency anemia   CURRENT THERAPY: Surveillance AND IV iron PRN    BRIEF ONCOLOGIC HISTORY:    Adenocarcinoma of colon ([place])   01/25/2016 Imaging    CT C/A/P large 9 cm mass within the colon at level of hepatic flexure, enlarged mesenteric LN concerning for metastatic disease, There is a nonspecific 13 mm nodule within the right hemipelvis which may represent a metastatic deposit. Alternatively, given the appearance, this may represent a small amount of loculated fluid. There are nonspecific low-attenuation lesions within the liver, the majority which are subcentimeter in size. Metastatic disease is not excluded.       01/25/2016 Procedure    Colonoscopy on 6/22 with Large fungating, exophytic tumor appears to be arising out of the base of cecum / appendix. It's partially obscuring the ileocecal valve.      01/29/2016 Surgery    R hemicolectomy with resection of terminal ileum, liver biopsy with Dr. Arnoldo Morale      01/29/2016 Pathology Results    INVASIVE WELL DIFFERENTIATED ADENOCARCINOMA WITH ABUNDANT EXTRACELLULAR MUCIN, SPANNING 19 CM IN GREATEST DIMENSION. - TUMOR INVADES THROUGH MUSCULARIS PROPRIA THROUGH THE SEROSA OF ONE BOWEL SEGMENT INTO A SECOND ADHESED SEGMENT OF BOWEL. - MARGINS ARE NEGATIVE.No extramural satellite tumor nodules seen, 0/27 LN, Liver biopsy with benign liver tissue      03/15/2016 PET scan    No abnormal metabolic activity status post interval right hemicolectomy. No evidence of residual or metastatic disease. The right upper lobe pulmonary nodule is stable and without hypermetabolic activity. This favors a benign etiology.        HISTORY OF PRESENT ILLNESS:  (From  Kirby Crigler, PA-C's note on 07/04/16)      INTERVAL HISTORY:  Ms. Schelling 61 y.o. female presents for continued follow-up for Stage IIC colon cancer and iron deficiency anemia.   She is here today with an employee from her living facility.  Overall, she tells me she has been feeling very well; appetite and energy levels are both 100%. Denies any blood in her stool, dark/tarry stool, hematuria, or vaginal bleeding. She is largely without complaints today.  She enjoys playing card games and watching soap operas on TV at the facility where she lives.    Last IV iron administration:  Oncology Flowsheet 01/26/2016  ferumoxytol [facility]) IV 510 mg      REVIEW OF SYSTEMS: (portions of ROS difficult given patient's cognitive deficit)  Review of Systems  Constitutional: Negative.  Negative for fatigue.  HENT:  Negative.  Negative for nosebleeds.   Eyes: Negative.   Respiratory: Negative.  Negative for cough and shortness of breath.   Cardiovascular: Negative.  Negative for chest pain and leg swelling.  Gastrointestinal: Negative.  Negative for abdominal pain, blood in stool, constipation, diarrhea, nausea and vomiting.  Endocrine: Negative.   Genitourinary: Negative.  Negative for dysuria, hematuria and vaginal bleeding.   Musculoskeletal: Negative.  Negative for arthralgias.  Skin: Negative.  Negative for rash.  Neurological: Negative.  Negative for dizziness and headaches.  Hematological: Negative.  Negative for adenopathy. Does not bruise/bleed easily.  Psychiatric/Behavioral: Negative.  Negative for depression and sleep disturbance. The patient is not nervous/anxious.      PAST MEDICAL/SURGICAL HISTORY:  Past Medical  History:  Diagnosis Date  . Adenocarcinoma of colon ([place])   . Allergy   . Diabetes mellitus without complication ([place]) 1829  . GERD (gastroesophageal reflux disease)   . Iron deficiency anemia 09/18/2013  . Schizophrenia [facility])    Past Surgical History:  Procedure  Laterality Date  . BIOPSY  01/24/2016   Procedure: BIOPSY;  Surgeon: Daneil Dolin, MD;  Location: [facility];  Service: Endoscopy;;  cecal mass  . COLONOSCOPY  04/21/2006   SLF: limited colonoscopy due to a poor bowel prep   . COLONOSCOPY  02/22/2009   SLF: poor bowel prep/Formed stools in the right colon which limited the extent of the exam/The scope was passed to approximately the proximal transverse colon.  No polyps, masses, inflammatory changes, diverticular/normal rectum  . COLONOSCOPY WITH PROPOFOL N/A 01/24/2016   Procedure: COLONOSCOPY WITH PROPOFOL;  Surgeon: Daneil Dolin, MD;  Location: [facility];  Service: Endoscopy;  Laterality: N/A;  . EXCISIONAL HEMORRHOIDECTOMY    . LIVER BIOPSY N/A 01/29/2016   Procedure: LIVER BIOPSY;  Surgeon: Aviva Signs, MD;  Location: [facility] ORS;  Service: General;  Laterality: N/A;  . PARTIAL COLECTOMY N/A 01/29/2016   Procedure: PARTIAL COLECTOMY ;  Surgeon: Aviva Signs, MD;  Location: [facility] ORS;  Service: General;  Laterality: N/A;  . PORTACATH PLACEMENT Left 01/29/2016   Procedure: INSERTION PORT-A-CATH;  Surgeon: Aviva Signs, MD;  Location: [facility] ORS;  Service: General;  Laterality: Left;  left subclavian     SOCIAL HISTORY:  Social History   Social History  . Marital status: Single    Spouse name: N/A  . Number of children: N/A  . Years of education: N/A   Occupational History  . Not on file.   Social History Main Topics  . Smoking status: Former Research scientist (life sciences)  . Smokeless tobacco: Never Used     Comment: quit 10-15 years  . Alcohol use No  . Drug use: No  . Sexual activity: No   Other Topics Concern  . Not on file   Social History Narrative  . No narrative on file    FAMILY HISTORY:  Family History  Problem Relation Age of Onset  . Leukemia Paternal Grandfather   . Leukemia Cousin   . Colon cancer Neg Hx     CURRENT MEDICATIONS:  Outpatient Encounter Prescriptions as of 02/19/2017  Medication Sig  . benztropine (COGENTIN) 2  MG tablet Take 1 mg by mouth 2 (two) times daily.   . clonazePAM (KLONOPIN) 0.5 MG tablet Take 0.5 mg by mouth 2 (two) times daily.   . divalproex (DEPAKOTE ER) 250 MG 24 hr tablet Take 250 mg by mouth at bedtime.  . docusate sodium (COLACE) 100 MG capsule Take 100 mg by mouth daily as needed for mild constipation.  . famotidine (PEPCID) 20 MG tablet Take 20 mg by mouth daily.  . ferrous sulfate 325 (65 FE) MG tablet Take 325 mg by mouth 2 (two) times daily with a meal.  . fluPHENAZine (PROLIXIN) 5 MG tablet Take 5 mg by mouth 2 (two) times daily.  Marland Kitchen loratadine (CLARITIN) 10 MG tablet Take 10 mg by mouth daily.  . metFORMIN (GLUCOPHAGE) 500 MG tablet Take 500 mg by mouth 2 (two) times daily with a meal.   . NON FORMULARY Take 1 capsule by mouth 2 (two) times daily. Fiber capsules 520 mg.  . polyethylene glycol-electrolytes (TRILYTE) 420 g solution Take 4,000 mLs by mouth as directed.  . simvastatin (ZOCOR) 20 MG tablet Take  20 mg by mouth daily.   No facility-administered encounter medications on file as of 02/19/2017.     ALLERGIES:  No Known Allergies   PHYSICAL EXAM:  ECOG Performance status: Difficult to determine; patient currently lives in facility for history of mental illness.   Vitals:   02/19/17 1438  BP: (!) 106/55  Pulse: (!) 54  Resp: 16  Temp: 97.7 F (36.5 C)   Filed Weights   02/19/17 1438  Weight: 159 lb 9.6 oz (72.4 kg)    Physical Exam  Constitutional: She is oriented to person, place, and time and well-developed, well-nourished, and in no distress.  HENT:  Head: Normocephalic.  Mouth/Throat: Oropharynx is clear and moist. No oropharyngeal exudate.  Eyes: Pupils are equal, round, and reactive to light. Conjunctivae are normal. No scleral icterus.  Neck: Normal range of motion. Neck supple.  Cardiovascular: Regular rhythm.   Bradycardic  Pulmonary/Chest: Effort normal and breath sounds normal. No respiratory distress. She has no wheezes. She has no rales.   Abdominal: Soft. Bowel sounds are normal. There is no tenderness. There is no rebound and no guarding.  Musculoskeletal: Normal range of motion. She exhibits no edema.  Lymphadenopathy:    She has no cervical adenopathy.       Right: No supraclavicular adenopathy present.       Left: No supraclavicular adenopathy present.  Neurological: She is alert and oriented to person, place, and time. No cranial nerve deficit. Gait normal.  Skin: Skin is warm and dry. No rash noted.  Psychiatric: Mood and affect normal.  Cognitive deficit  Nursing note and vitals reviewed.    LABORATORY DATA:  I have reviewed the labs as listed.  CBC    Component Value Date/Time   WBC 4.8 02/19/2017 1409   RBC 4.60 02/19/2017 1409   HGB 12.1 02/19/2017 1409   HCT 38.0 02/19/2017 1409   PLT 143 (L) 02/19/2017 1409   MCV 82.6 02/19/2017 1409   MCH 26.3 02/19/2017 1409   MCHC 31.8 02/19/2017 1409   RDW 15.5 02/19/2017 1409   LYMPHSABS 2.1 02/19/2017 1409   MONOABS 0.4 02/19/2017 1409   EOSABS 0.1 02/19/2017 1409   BASOSABS 0.0 02/19/2017 1409   CMP Latest Ref Rng & Units 01/30/2017 03/01/2016 01/31/2016  Glucose 65 - 99 mg/dL - 88 161(H)  BUN 6 - 20 mg/dL - 11 5(L)  Creatinine 0.44 - 1.00 mg/dL 0.90 0.75 0.59  Sodium 135 - 145 mmol/L - 138 140  Potassium 3.5 - 5.1 mmol/L - 4.5 3.6  Chloride 101 - 111 mmol/L - 107 109  CO2 22 - 32 mmol/L - 25 27  Calcium 8.9 - 10.3 mg/dL - 9.5 8.3(L)  Total Protein 6.5 - 8.1 g/dL - 7.9 -  Total Bilirubin 0.3 - 1.2 mg/dL - 0.2(L) -  Alkaline Phos 38 - 126 U/L - 92 -  AST 15 - 41 U/L - 33 -  ALT 14 - 54 U/L - 17 -      PENDING LABS:  CEA and ferritin pending    DIAGNOSTIC IMAGING:  *Radiologic images reviewed by this provider and agree with below radiologic reports as listed.  CT chest/abd/pelvis: 01/30/17        PATHOLOGY:  Colon resection path: 01/29/16              ASSESSMENT & PLAN:   Stage IIC (T4bN0M0) adenocarcinoma of colon:    -Diagnosed in 01/2016; treated with right hemicolectomy with resection of terminal ileum; there was questionable liver  lesion on imaging which was biopsied and negative for malignancy.  Post-surgery PET scan negative for residual or metastatic disease. There was notation of RUL pulmonary nodule that was stable and non-hypermetabolic on PET in 04/2425. CT chest imaging in 01/2017 revealed stable lung nodule with benign etiology being favored given stability x 1 year.  -Restaging CT chest/abd/pelvis on 01/30/17 negative for recurrent or metastatic disease. I reviewed these results in detail with the patient and her caregiver today; a paper copy of the radiologic report was given to the patient's caregiver as well.  It remains difficult to ascertain patient's complete understanding of her condition given her cognitive deficit.  Clinically, she is doing very well and largely with no complaints today.   -CEA elevated at 38.8 at time of diagnosis; post-operative CEA levels have normalized.  Most recent available CEA normal at 3.8 on 10/31/16. CEA for today is pending.  -Guidelines for follow-up reviewed. She will be due for repeat CT chest/abd/pelvis in 01/2018 (imaging more often than annually considered category 2B evidence per NCCN Guidelines).   -Due for colonoscopy with Dr. Gala Romney in 03/2017 as scheduled.  -Return to cancer center in 4 months for follow-up with labs.        Iron deficiency anemia:  -Hemoglobin preserved and normal at 12.1 g/dL today.  Last ferritin 79 in 10/2016.  -Ferritin pending. Last dose IV iron (Feraheme) was 01/2016.   Mild thrombocytopenia:  -Plt count mildly low at 143,000. No active bleeding episodes. There was previous thrombocytopenia with platelet count in the 140K range in 06/2016 and resolved.  Will continue to monitor.    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 Will refer patient to  [facility] in [place] for their evaluation and management. Patient's caregiver states that it should not be a problem for patient to have transportation from their facility to an appointment in [place].         Dispo:  -Refer to [facility] for possible pulmonary hypertension.  -Return to cancer center in 4 months with labs for continued follow-up.    All questions were answered to patient's stated satisfaction. Encouraged patient to call with any new concerns or questions before her next visit to the cancer center and we can certain see her sooner, if needed.        Orders placed this encounter:  Orders Placed This Encounter  Procedures  . CBC with Differential/Platelet  . Comprehensive metabolic panel  . CEA  . Ferritin      Mike Craze, NP [place] [phone]

## 2017-02-20 ENCOUNTER — Other Ambulatory Visit (HOSPITAL_COMMUNITY): Payer: Self-pay | Admitting: Oncology

## 2017-02-20 DIAGNOSIS — C189 Malignant neoplasm of colon, unspecified: Secondary | ICD-10-CM

## 2017-02-20 LAB — CEA: CEA: 5 ng/mL — ABNORMAL HIGH (ref 0.0–4.7)

## 2017-02-28 ENCOUNTER — Telehealth: Payer: Self-pay

## 2017-02-28 NOTE — Telephone Encounter (Signed)
Talked with Melissa, LNP from Nursing she is saying that the patient is going to have to be admit the day before her TCS because she is never cleaned out. She will eat in the middle of the night. Please advise

## 2017-02-28 NOTE — Telephone Encounter (Signed)
We need to postpone upcoming colonoscopy until this can be discussed by the person who last saw her and Dr. Gala Romney.

## 2017-03-05 ENCOUNTER — Other Ambulatory Visit (HOSPITAL_COMMUNITY): Payer: Medicaid Other

## 2017-03-10 ENCOUNTER — Ambulatory Visit (HOSPITAL_COMMUNITY): Admit: 2017-03-10 | Payer: Medicaid Other | Admitting: Internal Medicine

## 2017-03-10 ENCOUNTER — Encounter (HOSPITAL_COMMUNITY): Payer: Self-pay

## 2017-03-10 SURGERY — COLONOSCOPY WITH PROPOFOL
Anesthesia: Monitor Anesthesia Care

## 2017-04-02 ENCOUNTER — Other Ambulatory Visit (HOSPITAL_COMMUNITY): Payer: Self-pay | Admitting: *Deleted

## 2017-04-03 ENCOUNTER — Encounter (HOSPITAL_COMMUNITY): Payer: Medicaid Other | Attending: Oncology

## 2017-04-03 DIAGNOSIS — C189 Malignant neoplasm of colon, unspecified: Secondary | ICD-10-CM | POA: Insufficient documentation

## 2017-04-04 LAB — CEA: CEA1: 4.2 ng/mL (ref 0.0–4.7)

## 2017-05-05 ENCOUNTER — Ambulatory Visit (INDEPENDENT_AMBULATORY_CARE_PROVIDER_SITE_OTHER): Payer: Self-pay | Admitting: Internal Medicine

## 2017-05-05 ENCOUNTER — Encounter: Payer: Self-pay | Admitting: Internal Medicine

## 2017-05-05 VITALS — BP 116/74 | HR 52 | Ht 64.0 in | Wt 159.6 lb

## 2017-05-05 DIAGNOSIS — I272 Pulmonary hypertension, unspecified: Secondary | ICD-10-CM | POA: Insufficient documentation

## 2017-05-05 DIAGNOSIS — F203 Undifferentiated schizophrenia: Secondary | ICD-10-CM

## 2017-05-05 NOTE — Patient Instructions (Signed)
Please see patient coordinator before you leave today  to schedule echo and  I will call with results and recommendations for next steps

## 2017-05-05 NOTE — Progress Notes (Signed)
Subjective:     Patient ID: Mary Wells, female   DOB: 30-Oct-1955, 61 y.o.   MRN: [id number]  HPI   61 yobf quit smoking around 1998  - living in group home since age 51 due to schizoprhenia referred to pulmonary clinic 05/05/2017 by Dr   Dr Renato Battles for incidental / [facility] on CT chest 01/30/17 though pt completely asymptomatic    05/05/2017 1st [facility] office visit/ Kasha Howeth   Chief Complaint  Patient presents with  . Advice Only    Referred by Mike Craze for poss. pulmonary htn.  Denies any cough, SOB, or CP.  very active at group home including up one flight steps s sob or leg swellling   No obvious day to day or daytime variability or assoc excess/ purulent sputum or mucus plugs or hemoptysis or cp or chest tightness, subjective wheeze or overt sinus or hb symptoms. No unusual exp hx or h/o childhood pna/ asthma or knowledge of premature birth.  Sleeping ok flat without nocturnal  or early am exacerbation  of respiratory  c/o's or need for noct saba. Also denies any obvious fluctuation of symptoms with weather or environmental changes or other aggravating or alleviating factors except as outlined above   Current Allergies, Complete Past Medical History, Past Surgical History, Family History, and Social History were reviewed in [employer] record.  ROS  The following are not active complaints unless bolded Hoarseness, sore throat, dysphagia, dental problems, itching, sneezing,  nasal congestion or discharge of excess mucus or purulent secretions, ear ache,   fever, chills, sweats, unintended wt loss or wt gain, classically pleuritic or exertional cp,  orthopnea pnd or leg swelling, presyncope, palpitations, abdominal pain, anorexia, nausea, vomiting, diarrhea  or change in bowel habits or change in bladder habits, change in stools or change in urine, dysuria, hematuria,  rash, arthralgias, visual complaints, headache, numbness, weakness or ataxia or problems with  walking or coordination,  change in mood/affect or memory.        Current Meds  Medication Sig  . benztropine (COGENTIN) 2 MG tablet Take 1 mg by mouth 2 (two) times daily.   . clonazePAM (KLONOPIN) 0.5 MG tablet Take 0.5 mg by mouth 2 (two) times daily.   . divalproex (DEPAKOTE ER) 250 MG 24 hr tablet Take 250 mg by mouth at bedtime.  . docusate sodium (COLACE) 100 MG capsule Take 100 mg by mouth daily as needed for mild constipation.  . famotidine (PEPCID) 20 MG tablet Take 20 mg by mouth daily.  . ferrous sulfate 325 (65 FE) MG tablet Take 325 mg by mouth 2 (two) times daily with a meal.  . fluPHENAZine (PROLIXIN) 5 MG tablet Take 5 mg by mouth 2 (two) times daily.  Marland Kitchen loratadine (CLARITIN) 10 MG tablet Take 10 mg by mouth daily.  . metFORMIN (GLUCOPHAGE) 500 MG tablet Take 500 mg by mouth 2 (two) times daily with a meal.   . NON FORMULARY Take 1 capsule by mouth 2 (two) times daily. Fiber capsules 520 mg.  . polyethylene glycol-electrolytes (TRILYTE) 420 g solution Take 4,000 mLs by mouth as directed.  . simvastatin (ZOCOR) 20 MG tablet Take 20 mg by mouth daily.            Review of Systems     Objective:   Physical Exam   Animated pleasant bf child like affect nad   Wt Readings from Last 3 Encounters:  05/05/17 159 lb 9.6 oz (72.4 kg)  02/19/17 159 lb 9.6 oz (72.4 kg)  12/23/16 156 lb 3.2 oz (70.9 kg)    Vital signs reviewed - Note on arrival 02 sats  100% on RA      HEENT: nl   turbinates bilaterally, and oropharynx. Nl external ear canals without cough reflex- denture top    NECK :  without JVD/Nodes/TM/ nl carotid upstrokes bilaterally   LUNGS: no acc muscle use,  Nl contour chest which is clear to A and P bilaterally without cough on insp or exp maneuvers   CV:  RRR  no s3 or murmur or increase in P2, and no edema   ABD:  soft and nontender with nl inspiratory excursion in the supine position. No bruits or organomegaly appreciated, bowel sounds nl  MS:   Nl gait/ ext warm without deformities, calf tenderness, cyanosis or clubbing No obvious joint restrictions   SKIN: warm and dry without lesions    NEURO:  alert, approp, nl sensorium with  no motor or cerebellar deficits apparent.     I personally reviewed images and agree with radiology impression as follows:   Chest CT 01/30/17  1. Status post right hemicolectomy. No findings to suggest metastatic disease in the chest, abdomen or pelvis. 2. Stable right upper lobe pulmonary nodule measuring 1 x 0.8 cm, presumably benign given stability over a 1 year time interval and lack of hypermetabolism on prior PET-CT. 3. Dilatation of the pulmonic trunk (3.5 cm in diameter), concerning for potential pulmonary arterial hypertension.          Assessment:

## 2017-05-06 ENCOUNTER — Other Ambulatory Visit: Payer: Self-pay | Admitting: Internal Medicine

## 2017-05-06 ENCOUNTER — Encounter: Payer: Self-pay | Admitting: Internal Medicine

## 2017-05-06 DIAGNOSIS — I272 Pulmonary hypertension, unspecified: Secondary | ICD-10-CM

## 2017-05-06 NOTE — Assessment & Plan Note (Addendum)
Suggested by CT s contrast 01/30/17/ asymptomatic Echo 05/05/2017 >>>   PH is unlikely if the PASP is ?36, the TRV is ?2.8, and there are no other suggestive findings  PH is confirmed when the mean pulmonary artery pressure is ?25 mmHg at rest.  A mean pulmonary artery pressure of 8 to 20 mmHg at rest is considered normal, while a mean pulmonary artery pressure of 21 to 24 mmHg at rest has uncertain clinical implications. Normal PAS ranges from 15 to 30 mmHg, diastolic pressure from 4 to 12 mmHg, and normal mPAP is ?20 mmHg.  Will start with echo with bubble study to r/o shunt/ congenital ht dz  then w/u with v/q  Lung scan / ono RA if she has significant PH   Total time devoted to counseling  > 50 % of initial 45 min office visit:  review case with pt/care taker discussion of options/alternatives/ personally creating written customized instructions  in presence of pt  then going over those specific  Instructions directly with the pt including how to use all of the meds but in particular covering each new medication in detail and the difference between the maintenance= "automatic" meds and the prns using an action plan format for the latter (If this problem/symptom => do that organization reading Left to right).  Please see AVS from this visit for a full list of these instructions which I personally wrote for this pt and  are unique to this visit.

## 2017-05-06 NOTE — Assessment & Plan Note (Signed)
Will be a challenge for both w/u and potential rx so need to lock down an accurate dx to the extent possible before considering any long term rx options

## 2017-05-09 ENCOUNTER — Ambulatory Visit (HOSPITAL_COMMUNITY): Payer: Self-pay | Attending: Internal Medicine

## 2017-06-24 ENCOUNTER — Other Ambulatory Visit (HOSPITAL_COMMUNITY): Payer: Self-pay | Admitting: Internal Medicine

## 2017-06-24 DIAGNOSIS — Z1231 Encounter for screening mammogram for malignant neoplasm of breast: Secondary | ICD-10-CM

## 2017-06-25 ENCOUNTER — Ambulatory Visit (HOSPITAL_COMMUNITY): Payer: Medicaid Other | Admitting: Adult Health

## 2017-06-25 ENCOUNTER — Other Ambulatory Visit (HOSPITAL_COMMUNITY): Payer: Medicaid Other

## 2017-07-14 ENCOUNTER — Ambulatory Visit (HOSPITAL_COMMUNITY): Payer: Medicaid Other

## 2017-07-15 NOTE — Progress Notes (Deleted)
[place], [place] [address]   CLINIC:  Medical Oncology/Hematology  PCP:  Rosita Fire, MD [place] [address] [phone]   REASON FOR VISIT:  Follow-up for Stage IIC (T4bN0M0) adenocarcinoma of colon AND iron deficiency anemia   CURRENT THERAPY: Surveillance AND IV iron PRN    BRIEF ONCOLOGIC HISTORY:    Adenocarcinoma of colon ([place])   01/25/2016 Imaging    CT C/A/P large 9 cm mass within the colon at level of hepatic flexure, enlarged mesenteric LN concerning for metastatic disease, There is a nonspecific 13 mm nodule within the right hemipelvis which may represent a metastatic deposit. Alternatively, given the appearance, this may represent a small amount of loculated fluid. There are nonspecific low-attenuation lesions within the liver, the majority which are subcentimeter in size. Metastatic disease is not excluded.       01/25/2016 Procedure    Colonoscopy on 6/22 with Large fungating, exophytic tumor appears to be arising out of the base of cecum / appendix. It's partially obscuring the ileocecal valve.      01/29/2016 Surgery    R hemicolectomy with resection of terminal ileum, liver biopsy with Dr. Arnoldo Morale      01/29/2016 Pathology Results    INVASIVE WELL DIFFERENTIATED ADENOCARCINOMA WITH ABUNDANT EXTRACELLULAR MUCIN, SPANNING 19 CM IN GREATEST DIMENSION. - TUMOR INVADES THROUGH MUSCULARIS PROPRIA THROUGH THE SEROSA OF ONE BOWEL SEGMENT INTO A SECOND ADHESED SEGMENT OF BOWEL. - MARGINS ARE NEGATIVE.No extramural satellite tumor nodules seen, 0/27 LN, Liver biopsy with benign liver tissue      03/15/2016 PET scan    No abnormal metabolic activity status post interval right hemicolectomy. No evidence of residual or metastatic disease. The right upper lobe pulmonary nodule is stable and without hypermetabolic activity. This favors a benign etiology.        HISTORY OF PRESENT ILLNESS:  (From  Kirby Crigler, PA-C's note on 07/04/16)      INTERVAL HISTORY:  Ms. Holwerda 61 y.o. female presents for continued follow-up for Stage IIC colon cancer and iron deficiency anemia.   She is here today with an employee from her living facility.  Overall, she tells me she has been feeling very well; appetite and energy levels are both 100%. Denies any blood in her stool, dark/tarry stool, hematuria, or vaginal bleeding. She is largely without complaints today.  She enjoys playing card games and watching soap operas on TV at the facility where she lives.    Last IV iron administration:  Oncology Flowsheet 01/26/2016  ferumoxytol [facility]) IV 510 mg      REVIEW OF SYSTEMS: (portions of ROS difficult given patient's cognitive deficit)  Review of Systems  Constitutional: Negative.  Negative for fatigue.  HENT:  Negative.  Negative for nosebleeds.   Eyes: Negative.   Respiratory: Negative.  Negative for cough and shortness of breath.   Cardiovascular: Negative.  Negative for chest pain and leg swelling.  Gastrointestinal: Negative.  Negative for abdominal pain, blood in stool, constipation, diarrhea, nausea and vomiting.  Endocrine: Negative.   Genitourinary: Negative.  Negative for dysuria, hematuria and vaginal bleeding.   Musculoskeletal: Negative.  Negative for arthralgias.  Skin: Negative.  Negative for rash.  Neurological: Negative.  Negative for dizziness and headaches.  Hematological: Negative.  Negative for adenopathy. Does not bruise/bleed easily.  Psychiatric/Behavioral: Negative.  Negative for depression and sleep disturbance. The patient is not nervous/anxious.      PAST MEDICAL/SURGICAL HISTORY:  Past Medical  History:  Diagnosis Date  . Adenocarcinoma of colon ([place])   . Allergy   . Diabetes mellitus without complication ([place]) 8527  . GERD (gastroesophageal reflux disease)   . Iron deficiency anemia 09/18/2013  . Schizophrenia [facility])    Past Surgical History:  Procedure  Laterality Date  . BIOPSY  01/24/2016   Procedure: BIOPSY;  Surgeon: Daneil Dolin, MD;  [facility];  Service: Endoscopy;;  cecal mass  . COLONOSCOPY  04/21/2006   SLF: limited colonoscopy due to a poor bowel prep   . COLONOSCOPY  02/22/2009   SLF: poor bowel prep/Formed stools in the right colon which limited the extent of the exam/The scope was passed to approximately the proximal transverse colon.  No polyps, masses, inflammatory changes, diverticular/normal rectum  . COLONOSCOPY WITH PROPOFOL N/A 01/24/2016   Procedure: COLONOSCOPY WITH PROPOFOL;  Surgeon: Daneil Dolin, MD;  Location[facility];  Service: Endoscopy;  Laterality: N/A;  . EXCISIONAL HEMORRHOIDECTOMY    . LIVER BIOPSY N/A 01/29/2016   Procedure: LIVER BIOPSY;  Surgeon: Aviva Signs, MD;  Location: [facility] ORS;  Service: General;  Laterality: N/A;  . PARTIAL COLECTOMY N/A 01/29/2016   Procedure: PARTIAL COLECTOMY ;  Surgeon: Aviva Signs, MD;  Location: [facility] ORS;  Service: General;  Laterality: N/A;  . PORTACATH PLACEMENT Left 01/29/2016   Procedure: INSERTION PORT-A-CATH;  Surgeon: Aviva Signs, MD;  Location: [facility] OR[facility];  Service: General;  Laterality: Left;  left subclavian     SOCIAL HISTORY:  Social History   Socioeconomic History  . Marital status: Single    Spouse name: Not on file  . Number of children: Not on file  . Years of education: Not on file  . Highest education level: Not on file  Social Needs  . Financial resource strain: Not on file  . Food insecurity - worry: Not on file  . Food insecurity - inability: Not on file  . Transportation needs - medical: Not on file  . Transportation needs - non-medical: Not on file  Occupational History  . Not on file  Tobacco Use  . Smoking status: Former Smoker    Packs/day: 0.50    Years: 19.00    Pack years: 9.50    Types: Cigarettes    Last attempt to quit: 05/05/1997    Years since quitting: 20.2  . Smokeless tobacco: Never Used  . Tobacco comment:  quit 10-15 years  Substance and Sexual Activity  . Alcohol use: No    Alcohol/week: 0.0 oz  . Drug use: No  . Sexual activity: No  Other Topics Concern  . Not on file  Social History Narrative  . Not on file    FAMILY HISTORY:  Family History  Problem Relation Age of Onset  . Leukemia Paternal Grandfather   . Leukemia Cousin   . Colon cancer Neg Hx     CURRENT MEDICATIONS:  Outpatient Encounter Medications as of 07/16/2017  Medication Sig  . benztropine (COGENTIN) 2 MG tablet Take 1 mg by mouth 2 (two) times daily.   . clonazePAM (KLONOPIN) 0.5 MG tablet Take 0.5 mg by mouth 2 (two) times daily.   . divalproex (DEPAKOTE ER) 250 MG 24 hr tablet Take 250 mg by mouth at bedtime.  . docusate sodium (COLACE) 100 MG capsule Take 100 mg by mouth daily as needed for mild constipation.  . famotidine (PEPCID) 20 MG tablet Take 20 mg by mouth daily.  . ferrous sulfate 325 (65 FE) MG tablet Take  325 mg by mouth 2 (two) times daily with a meal.  . fluPHENAZine (PROLIXIN) 5 MG tablet Take 5 mg by mouth 2 (two) times daily.  Marland Kitchen loratadine (CLARITIN) 10 MG tablet Take 10 mg by mouth daily.  . metFORMIN (GLUCOPHAGE) 500 MG tablet Take 500 mg by mouth 2 (two) times daily with a meal.   . NON FORMULARY Take 1 capsule by mouth 2 (two) times daily. Fiber capsules 520 mg.  . polyethylene glycol-electrolytes (TRILYTE) 420 g solution Take 4,000 mLs by mouth as directed.  . simvastatin (ZOCOR) 20 MG tablet Take 20 mg by mouth daily.   No facility-administered encounter medications on file as of 07/16/2017.     ALLERGIES:  No Known Allergies   PHYSICAL EXAM:  ECOG Performance status: Difficult to determine; patient currently lives in facility for history of mental illness.   There were no vitals filed for this visit. There were no vitals filed for this visit.  Physical Exam  Constitutional: She is oriented to person, place, and time and well-developed, well-nourished, and in no distress.    HENT:  Head: Normocephalic.  Mouth/Throat: Oropharynx is clear and moist. No oropharyngeal exudate.  Eyes: Conjunctivae are normal. Pupils are equal, round, and reactive to light. No scleral icterus.  Neck: Normal range of motion. Neck supple.  Cardiovascular: Regular rhythm.  Bradycardic  Pulmonary/Chest: Effort normal and breath sounds normal. No respiratory distress. She has no wheezes. She has no rales.  Abdominal: Soft. Bowel sounds are normal. There is no tenderness. There is no rebound and no guarding.  Musculoskeletal: Normal range of motion. She exhibits no edema.  Lymphadenopathy:    She has no cervical adenopathy.       Right: No supraclavicular adenopathy present.       Left: No supraclavicular adenopathy present.  Neurological: She is alert and oriented to person, place, and time. No cranial nerve deficit. Gait normal.  Skin: Skin is warm and dry. No rash noted.  Psychiatric: Mood and affect normal.  Cognitive deficit  Nursing note and vitals reviewed.    LABORATORY DATA:  I have reviewed the labs as listed.  CBC    Component Value Date/Time   WBC 4.8 02/19/2017 1409   RBC 4.60 02/19/2017 1409   HGB 12.1 02/19/2017 1409   HCT 38.0 02/19/2017 1409   PLT 143 (L) 02/19/2017 1409   MCV 82.6 02/19/2017 1409   MCH 26.3 02/19/2017 1409   MCHC 31.8 02/19/2017 1409   RDW 15.5 02/19/2017 1409   LYMPHSABS 2.1 02/19/2017 1409   MONOABS 0.4 02/19/2017 1409   EOSABS 0.1 02/19/2017 1409   BASOSABS 0.0 02/19/2017 1409   CMP Latest Ref Rng & Units 01/30/2017 03/01/2016 01/31/2016  Glucose 65 - 99 mg/dL - 88 161(H)  BUN 6 - 20 mg/dL - 11 5(L)  Creatinine 0.44 - 1.00 mg/dL 0.90 0.75 0.59  Sodium 135 - 145 mmol/L - 138 140  Potassium 3.5 - 5.1 mmol/L - 4.5 3.6  Chloride 101 - 111 mmol/L - 107 109  CO2 22 - 32 mmol/L - 25 27  Calcium 8.9 - 10.3 mg/dL - 9.5 8.3(L)  Total Protein 6.5 - 8.1 g/dL - 7.9 -  Total Bilirubin 0.3 - 1.2 mg/dL - 0.2(L) -  Alkaline Phos 38 - 126 U/L -  92 -  AST 15 - 41 U/L - 33 -  ALT 14 - 54 U/L - 17 -      PENDING LABS:  CEA and ferritin pending  DIAGNOSTIC IMAGING:  *Radiologic images reviewed by this provider and agree with below radiologic reports as listed.  CT chest/abd/pelvis: 01/30/17        PATHOLOGY:  Colon resection path: 01/29/16              ASSESSMENT & PLAN:   Stage IIC (T4bN0M0) adenocarcinoma of colon:  -Diagnosed in 01/2016; treated with right hemicolectomy with resection of terminal ileum; there was questionable liver lesion on imaging which was biopsied and negative for malignancy.  Post-surgery PET scan negative for residual or metastatic disease. There was notation of RUL pulmonary nodule that was stable and non-hypermetabolic on PET in 01/7123. CT chest imaging in 01/2017 revealed stable lung nodule with benign etiology being favored given stability x 1 year.  -Restaging CT chest/abd/pelvis on 01/30/17 negative for recurrent or metastatic disease. I reviewed these results in detail with the patient and her caregiver today; a paper copy of the radiologic report was given to the patient's caregiver as well.  It remains difficult to ascertain patient's complete understanding of her condition given her cognitive deficit.  Clinically, she is doing very well and largely with no complaints today.   -CEA elevated at 38.8 at time of diagnosis; post-operative CEA levels have normalized.  Most recent available CEA normal at 3.8 on 10/31/16. CEA for today is pending.  -Guidelines for follow-up reviewed. She will be due for repeat CT chest/abd/pelvis in 01/2018 (imaging more often than annually considered category 2B evidence per NCCN Guidelines).   -Due for colonoscopy with Dr. Gala Romney in 03/2017 as scheduled.  -Return to cancer center in 4 months for follow-up with labs.        Iron deficiency anemia:  -Hemoglobin preserved and normal at 12.1 g/dL today.  Last ferritin 79 in 10/2016.  -Ferritin pending. Last  dose IV iron (Feraheme) was 01/2016.   Mild thrombocytopenia:  -Plt count mildly low at 143,000. No active bleeding episodes. There was previous thrombocytopenia with platelet count in the 140K range in 06/2016 and resolved.  Will continue to monitor.    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 Will refer patient to [facility] in [place] for their evaluation and management. Patient's caregiver states that it should not be a problem for patient to have transportation from their facility to an appointment in [place].         Dispo:  -Refer to [facility] for possible pulmonary hypertension.  -Return to cancer center in 4 months with labs for continued follow-up.    All questions were answered to patient's stated satisfaction. Encouraged patient to call with any new concerns or questions before her next visit to the cancer center and we can certain see her sooner, if needed.        Orders placed this encounter:  No orders of the defined types were placed in this encounter.     Mike Craze, NP [facility] [phone]

## 2017-07-16 ENCOUNTER — Ambulatory Visit (HOSPITAL_COMMUNITY): Payer: Medicaid Other | Admitting: Adult Health

## 2017-07-16 ENCOUNTER — Other Ambulatory Visit (HOSPITAL_COMMUNITY): Payer: Medicaid Other

## 2017-08-18 ENCOUNTER — Inpatient Hospital Stay (HOSPITAL_COMMUNITY): Payer: Medicaid Other | Attending: Hematology and Oncology | Admitting: Oncology

## 2017-08-18 ENCOUNTER — Other Ambulatory Visit (HOSPITAL_COMMUNITY): Payer: Medicaid Other

## 2017-08-28 ENCOUNTER — Ambulatory Visit (HOSPITAL_COMMUNITY): Payer: Medicaid Other

## 2017-09-03 ENCOUNTER — Ambulatory Visit (HOSPITAL_COMMUNITY): Payer: Medicaid Other

## 2017-09-11 ENCOUNTER — Ambulatory Visit: Payer: Self-pay | Admitting: Family Medicine

## 2017-09-22 ENCOUNTER — Encounter (HOSPITAL_COMMUNITY): Payer: Self-pay | Admitting: Oncology

## 2017-09-22 ENCOUNTER — Inpatient Hospital Stay (HOSPITAL_BASED_OUTPATIENT_CLINIC_OR_DEPARTMENT_OTHER): Payer: Medicaid Other | Admitting: Oncology

## 2017-09-22 ENCOUNTER — Inpatient Hospital Stay (HOSPITAL_COMMUNITY): Payer: Medicaid Other | Attending: Oncology

## 2017-09-22 VITALS — BP 133/53 | HR 56 | Temp 98.7°F | Resp 18 | Wt 165.8 lb

## 2017-09-22 DIAGNOSIS — R911 Solitary pulmonary nodule: Secondary | ICD-10-CM | POA: Diagnosis not present

## 2017-09-22 DIAGNOSIS — C189 Malignant neoplasm of colon, unspecified: Secondary | ICD-10-CM

## 2017-09-22 DIAGNOSIS — D509 Iron deficiency anemia, unspecified: Secondary | ICD-10-CM

## 2017-09-22 DIAGNOSIS — D696 Thrombocytopenia, unspecified: Secondary | ICD-10-CM

## 2017-09-22 DIAGNOSIS — D500 Iron deficiency anemia secondary to blood loss (chronic): Secondary | ICD-10-CM

## 2017-09-22 LAB — CBC WITH DIFFERENTIAL/PLATELET
BASOS ABS: 0 10*3/uL (ref 0.0–0.1)
BASOS PCT: 0 %
Eosinophils Absolute: 0.1 10*3/uL (ref 0.0–0.7)
Eosinophils Relative: 3 %
HEMATOCRIT: 38.5 % (ref 36.0–46.0)
HEMOGLOBIN: 11.8 g/dL — AB (ref 12.0–15.0)
LYMPHS PCT: 40 %
Lymphs Abs: 2 10*3/uL (ref 0.7–4.0)
MCH: 25.4 pg — ABNORMAL LOW (ref 26.0–34.0)
MCHC: 30.6 g/dL (ref 30.0–36.0)
MCV: 82.8 fL (ref 78.0–100.0)
MONOS PCT: 9 %
Monocytes Absolute: 0.4 10*3/uL (ref 0.1–1.0)
NEUTROS ABS: 2.4 10*3/uL (ref 1.7–7.7)
NEUTROS PCT: 48 %
Platelets: 152 10*3/uL (ref 150–400)
RBC: 4.65 MIL/uL (ref 3.87–5.11)
RDW: 15 % (ref 11.5–15.5)
WBC: 4.9 10*3/uL (ref 4.0–10.5)

## 2017-09-22 LAB — COMPREHENSIVE METABOLIC PANEL
ALT: 15 U/L (ref 14–54)
AST: 25 U/L (ref 15–41)
Albumin: 3.8 g/dL (ref 3.5–5.0)
Alkaline Phosphatase: 58 U/L (ref 38–126)
Anion gap: 10 (ref 5–15)
BILIRUBIN TOTAL: 0.1 mg/dL — AB (ref 0.3–1.2)
BUN: 16 mg/dL (ref 6–20)
CO2: 24 mmol/L (ref 22–32)
CREATININE: 0.81 mg/dL (ref 0.44–1.00)
Calcium: 9.9 mg/dL (ref 8.9–10.3)
Chloride: 108 mmol/L (ref 101–111)
Glucose, Bld: 120 mg/dL — ABNORMAL HIGH (ref 65–99)
POTASSIUM: 4.4 mmol/L (ref 3.5–5.1)
Sodium: 142 mmol/L (ref 135–145)
Total Protein: 7.7 g/dL (ref 6.5–8.1)

## 2017-09-22 LAB — FERRITIN: FERRITIN: 71 ng/mL (ref 11–307)

## 2017-09-22 NOTE — Patient Instructions (Signed)
[facility]  Discharge Instructions:  You were seen by Jennifer Burns, NP, today. _______________________________________________________________  Thank you for choosing [facility] to provide your oncology and hematology care.  To afford each patient quality time with our providers, please arrive at least 15 minutes before your scheduled appointment.  You need to re-schedule your appointment if you arrive 10 or more minutes late.  We strive to give you quality time with our providers, and arriving late affects you and other patients whose appointments are after yours.  Also, if you no show three or more times for appointments you may be dismissed from the clinic.  Again, thank you for choosing [facility]. Our hope is that these requests will allow you access to exceptional care and in a timely manner. _______________________________________________________________  If you have questions after your visit, please contact our office at [phone] between the hours of 8:30 a.m. and 5:00 p.m. Voicemails left after 4:30 p.m. will not be returned until the following business day. _______________________________________________________________  For prescription refill requests, have your pharmacy contact our office. _______________________________________________________________  Recommendations made by the consultant and any test results will be sent to your referring physician. _______________________________________________________________ 

## 2017-09-22 NOTE — Progress Notes (Signed)
[facility] [place], [place] [address]   CLINIC:  Medical Oncology/Hematology  PCP:  Rosita Fire, MD [place] [address] [phone]   REASON FOR VISIT:  Follow-up for Stage IIC (T4bN0M0) adenocarcinoma of colon AND iron deficiency anemia   CURRENT THERAPY: Surveillance AND IV iron PRN    BRIEF ONCOLOGIC HISTORY:    Adenocarcinoma of colon ([place])   01/25/2016 Imaging    CT C/A/P large 9 cm mass within the colon at level of hepatic flexure, enlarged mesenteric LN concerning for metastatic disease, There is a nonspecific 13 mm nodule within the right hemipelvis which may represent a metastatic deposit. Alternatively, given the appearance, this may represent a small amount of loculated fluid. There are nonspecific low-attenuation lesions within the liver, the majority which are subcentimeter in size. Metastatic disease is not excluded.       01/25/2016 Procedure    Colonoscopy on 6/22 with Large fungating, exophytic tumor appears to be arising out of the base of cecum / appendix. It's partially obscuring the ileocecal valve.      01/29/2016 Surgery    R hemicolectomy with resection of terminal ileum, liver biopsy with Dr. Arnoldo Morale      01/29/2016 Pathology Results    INVASIVE WELL DIFFERENTIATED ADENOCARCINOMA WITH ABUNDANT EXTRACELLULAR MUCIN, SPANNING 19 CM IN GREATEST DIMENSION. - TUMOR INVADES THROUGH MUSCULARIS PROPRIA THROUGH THE SEROSA OF ONE BOWEL SEGMENT INTO A SECOND ADHESED SEGMENT OF BOWEL. - MARGINS ARE NEGATIVE.No extramural satellite tumor nodules seen, 0/27 LN, Liver biopsy with benign liver tissue      03/15/2016 PET scan    No abnormal metabolic activity status post interval right hemicolectomy. No evidence of residual or metastatic disease. The right upper lobe pulmonary nodule is stable and without hypermetabolic activity. This favors a benign etiology.        HISTORY OF PRESENT ILLNESS:  (From  Kirby Crigler, PA-C's note on 07/04/16)      INTERVAL HISTORY:  Mary Wells 62 y.o. female presents for continued follow-up for Stage IIC colon cancer and iron deficiency anemia.   She is here today with employee from her living facility. She tells me she has been feeling very well. Her appetite and energy levels are both 100%. She denies any blood in her stool or dark tarry stools. She offers no complaints today.  She was scheduled to have a repeat colonoscopy in September 2018 but unfortunately it was canceled due to the Hurricaine that hit the [place] and was never rescheduled.   She was also evaluated for pulmonary hypertension in October. Per Dr. Gustavus Bryant last note they were to get an echo with bubble study to rule out shunt and congenital health diagnosis. Then work up with [facility] Lung scan. She was scheduled for echo with bubble study but unfortunately this was never completed. The workup ended here.    Last IV iron administration:  Oncology Flowsheet 01/26/2016  ferumoxytol [facility]) IV 510 mg      REVIEW OF SYSTEMS: (portions of ROS difficult given patient's cognitive deficit)  Review of Systems  Constitutional: Negative.  Negative for fatigue.  HENT:  Negative.  Negative for nosebleeds.   Eyes: Negative.   Respiratory: Negative.  Negative for cough and shortness of breath.   Cardiovascular: Negative.  Negative for chest pain and leg swelling.  Gastrointestinal: Negative.  Negative for abdominal pain, blood in stool, constipation, diarrhea, nausea and vomiting.  Endocrine: Negative.   Genitourinary: Negative.  Negative for dysuria,  hematuria and vaginal bleeding.   Musculoskeletal: Negative.  Negative for arthralgias.  Skin: Negative.  Negative for rash.  Neurological: Negative.  Negative for dizziness and headaches.  Hematological: Negative.  Negative for adenopathy. Does not bruise/bleed easily.  Psychiatric/Behavioral: Negative.  Negative for depression and sleep disturbance.  The patient is not nervous/anxious.      PAST MEDICAL/SURGICAL HISTORY:  Past Medical History:  Diagnosis Date  . Adenocarcinoma of colon ([place])   . Allergy   . Diabetes mellitus without complication ([place]) 4562  . GERD (gastroesophageal reflux disease)   . Iron deficiency anemia 09/18/2013  . Schizophrenia [facility])    Past Surgical History:  Procedure Laterality Date  . BIOPSY  01/24/2016   Procedure: BIOPSY;  Surgeon: Daneil Dolin, MD;  [facility];  Service: Endoscopy;;  cecal mass  . COLONOSCOPY  04/21/2006   SLF: limited colonoscopy due to a poor bowel prep   . COLONOSCOPY  02/22/2009   SLF: poor bowel prep/Formed stools in the right colon which limited the extent of the exam/The scope was passed to approximately the proximal transverse colon.  No polyps, masses, inflammatory changes, diverticular/normal rectum  . COLONOSCOPY WITH PROPOFOL N/A 01/24/2016   Procedure: COLONOSCOPY WITH PROPOFOL;  Surgeon: Daneil Dolin, MD;  Location: [facility];  Service: Endoscopy;  Laterality: N/A;  . EXCISIONAL HEMORRHOIDECTOMY    . LIVER BIOPSY N/A 01/29/2016   Procedure: LIVER BIOPSY;  Surgeon: Aviva Signs, MD;  Location: [facility] ORS;  Service: General;  Laterality: N/A;  . PARTIAL COLECTOMY N/A 01/29/2016   Procedure: PARTIAL COLECTOMY ;  Surgeon: Aviva Signs, MD;  Location: [facility] ORS;  Service: General;  Laterality: N/A;  . PORTACATH PLACEMENT Left 01/29/2016   Procedure: INSERTION PORT-A-CATH;  Surgeon: Aviva Signs, MD;  Location: [facility] ORS;  Service: General;  Laterality: Left;  left subclavian     SOCIAL HISTORY:  Social History   Socioeconomic History  . Marital status: Single    Spouse name: Not on file  . Number of children: Not on file  . Years of education: Not on file  . Highest education level: Not on file  Social Needs  . Financial resource strain: Not on file  . Food insecurity - worry: Not on file  . Food insecurity - inability: Not on file  . Transportation needs  - medical: Not on file  . Transportation needs - non-medical: Not on file  Occupational History  . Not on file  Tobacco Use  . Smoking status: Former Smoker    Packs/day: 0.50    Years: 19.00    Pack years: 9.50    Types: Cigarettes    Last attempt to quit: 05/05/1997    Years since quitting: 20.3  . Smokeless tobacco: Never Used  . Tobacco comment: quit 10-15 years  Substance and Sexual Activity  . Alcohol use: No    Alcohol/week: 0.0 oz  . Drug use: No  . Sexual activity: No  Other Topics Concern  . Not on file  Social History Narrative  . Not on file    FAMILY HISTORY:  Family History  Problem Relation Age of Onset  . Leukemia Paternal Grandfather   . Leukemia Cousin   . Colon cancer Neg Hx     CURRENT MEDICATIONS:  Outpatient Encounter Medications as of 09/22/2017  Medication Sig  . benztropine (COGENTIN) 2 MG tablet Take 1 mg by mouth 2 (two) times daily.   . clonazePAM (KLONOPIN) 0.5 MG tablet Take 0.5 mg by  mouth 2 (two) times daily.   . divalproex (DEPAKOTE ER) 250 MG 24 hr tablet Take 250 mg by mouth at bedtime.  . docusate sodium (COLACE) 100 MG capsule Take 100 mg by mouth daily as needed for mild constipation.  . famotidine (PEPCID) 20 MG tablet Take 20 mg by mouth daily.  . ferrous sulfate 325 (65 FE) MG tablet Take 325 mg by mouth 2 (two) times daily with a meal.  . fluPHENAZine (PROLIXIN) 5 MG tablet Take 5 mg by mouth 2 (two) times daily.  Marland Kitchen loratadine (CLARITIN) 10 MG tablet Take 10 mg by mouth daily.  . metFORMIN (GLUCOPHAGE) 500 MG tablet Take 500 mg by mouth 2 (two) times daily with a meal.   . NON FORMULARY Take 1 capsule by mouth 2 (two) times daily. Fiber capsules 520 mg.  . polyethylene glycol-electrolytes (TRILYTE) 420 g solution Take 4,000 mLs by mouth as directed.  . simvastatin (ZOCOR) 20 MG tablet Take 20 mg by mouth daily.   No facility-administered encounter medications on file as of 09/22/2017.     ALLERGIES:  No Known  Allergies   PHYSICAL EXAM:  ECOG Performance status: Difficult to determine; patient currently lives in facility for history of mental illness.   Vitals:   09/22/17 1442  BP: (!) 133/53  Pulse: (!) 56  Resp: 18  Temp: 98.7 F (37.1 C)  SpO2: 97%   Filed Weights   09/22/17 1442  Weight: 165 lb 12.8 oz (75.2 kg)    Physical Exam  Constitutional: She is oriented to person, place, and time and well-developed, well-nourished, and in no distress.  HENT:  Head: Normocephalic.  Mouth/Throat: Oropharynx is clear and moist. No oropharyngeal exudate.  Eyes: Conjunctivae are normal. Pupils are equal, round, and reactive to light. No scleral icterus.  Neck: Normal range of motion. Neck supple.  Cardiovascular: Regular rhythm.  Bradycardic  Pulmonary/Chest: Effort normal and breath sounds normal. No respiratory distress. She has no wheezes. She has no rales.  Abdominal: Soft. Bowel sounds are normal. There is no tenderness. There is no rebound and no guarding.  Musculoskeletal: Normal range of motion. She exhibits no edema.  Lymphadenopathy:    She has no cervical adenopathy.       Right: No supraclavicular adenopathy present.       Left: No supraclavicular adenopathy present.  Neurological: She is alert and oriented to person, place, and time. No cranial nerve deficit. Gait normal.  Skin: Skin is warm and dry. No rash noted.  Psychiatric: Mood and affect normal.  Cognitive deficit  Nursing note and vitals reviewed.    LABORATORY DATA:  I have reviewed the labs as listed.  CBC    Component Value Date/Time   WBC 4.9 09/22/2017 1414   RBC 4.65 09/22/2017 1414   HGB 11.8 (L) 09/22/2017 1414   HCT 38.5 09/22/2017 1414   PLT 152 09/22/2017 1414   MCV 82.8 09/22/2017 1414   MCH 25.4 (L) 09/22/2017 1414   MCHC 30.6 09/22/2017 1414   RDW 15.0 09/22/2017 1414   LYMPHSABS 2.0 09/22/2017 1414   MONOABS 0.4 09/22/2017 1414   EOSABS 0.1 09/22/2017 1414   BASOSABS 0.0 09/22/2017 1414    CMP Latest Ref Rng & Units 09/22/2017 01/30/2017 03/01/2016  Glucose 65 - 99 mg/dL 120(H) - 88  BUN 6 - 20 mg/dL 16 - 11  Creatinine 0.44 - 1.00 mg/dL 0.81 0.90 0.75  Sodium 135 - 145 mmol/L 142 - 138  Potassium 3.5 - 5.1 mmol/L 4.4 -  4.5  Chloride 101 - 111 mmol/L 108 - 107  CO2 22 - 32 mmol/L 24 - 25  Calcium 8.9 - 10.3 mg/dL 9.9 - 9.5  Total Protein 6.5 - 8.1 g/dL 7.7 - 7.9  Total Bilirubin 0.3 - 1.2 mg/dL 0.1(L) - 0.2(L)  Alkaline Phos 38 - 126 U/L 58 - 92  AST 15 - 41 U/L 25 - 33  ALT 14 - 54 U/L 15 - 17      PENDING LABS:  CEA and ferritin pending    DIAGNOSTIC IMAGING:  *Radiologic images reviewed by this provider and agree with below radiologic reports as listed.  CT chest/abd/pelvis: 01/30/17        PATHOLOGY:  Colon resection path: 01/29/16              ASSESSMENT & PLAN:   Stage IIC (T4bN0M0) adenocarcinoma of colon:  -Diagnosed in 01/2016; treated with right hemicolectomy with resection of terminal ileum; there was questionable liver lesion on imaging which was biopsied and negative for malignancy.  Post-surgery PET scan negative for residual or metastatic disease. There was notation of RUL pulmonary nodule that was stable and non-hypermetabolic on PET in 11/4965. CT chest imaging in 01/2017 revealed stable lung nodule with benign etiology being favored given stability x 1 year.  - Restaging CT scans from 6018 were negative for recurrent or metastatic disease. She is due for repeat CT scan in June 2019. Orders placed today. -CEA elevated at 38.8 at time of diagnosis; post-operative CEA levels have normalized.  Most recent available CEA normal at 4.2. CEA is pending at this time. -She was due for colonoscopy with Dr. Gala Romney and in August 8018 but unfortunately this was canceled and never rescheduled. Will get her rescheduled ASAP for follow-up. -Return to cancer center in 4 months after CT scans for M.D. assessment, labs and imaging  results.       Iron deficiency anemia:  -Hemoglobin 11.8 today.  - Ferritin pending. -Last IV iron was 6 2017. Will call patient with results   Mild thrombocytopenia:  - Platelet count is normal today. 152.  Possible pulmonary artery hypertension:  -Workup was not completed. It appears she met with Dr. Shyrl Numbers but never completed workup. Encouraged patient and health care liaison to follow-up with pulmonology. Per Epic chart review, orders were placed for echocardiogram with bubble study.   Dispo:  -Refer back to gastroenterology for a colonoscopy with Dr. Gala Romney. -Refer back to pulmonology for complete workup with Dr. Melvyn Novas. -Return to cancer center in 4 months to see M.D. with labs and to discuss CT results.   All questions were answered to patient's stated satisfaction. Encouraged patient to call with any new concerns or questions before her next visit to the cancer center and we can certain see her sooner, if needed.      Orders placed this encounter:  Orders Placed This Encounter  Procedures  . CT Chest W Contrast  . CT Abdomen Pelvis W Contrast    Mary Casa, NP 09/22/2017 3:36 PM

## 2017-09-23 ENCOUNTER — Encounter: Payer: Self-pay | Admitting: Internal Medicine

## 2017-09-23 LAB — CEA: CEA: 4.2 ng/mL (ref 0.0–4.7)

## 2017-09-25 ENCOUNTER — Ambulatory Visit: Payer: Medicaid Other | Admitting: Gastroenterology

## 2017-10-06 ENCOUNTER — Telehealth: Payer: Self-pay | Admitting: Internal Medicine

## 2017-10-06 NOTE — Telephone Encounter (Signed)
Called Benta stating to her pt no showed for the echo that was scheduled at [facility] 05/09/17.  Benta expressed understanding and stated she would call [facility] to see if they can get pt rescheduled for an echo.  I stated to her as soon as echo is done when MW sees the results, we would call pt to let her know the results and see what the next steps are.  Nothing further needed at this current time.

## 2017-10-13 ENCOUNTER — Telehealth: Payer: Self-pay | Admitting: Internal Medicine

## 2017-10-13 NOTE — Telephone Encounter (Signed)
Number provided is incorrect. Did a search for Dr. Josephine Cables office and found a number of [phone]. Called that number and received a busy tone twice.   Will ATC back later.

## 2017-10-14 NOTE — Telephone Encounter (Signed)
Attempted to contact Dr. Josephine Cables office. No answer, no option to leave a message. Will try back.

## 2017-10-15 NOTE — Telephone Encounter (Signed)
Attempted to contact Dr. Josephine Cables office. No answer, no option to leave a message. Will try back.

## 2017-10-16 NOTE — Telephone Encounter (Signed)
We have attempted to contact Dr. Josephine Cables office several times with no success. Per triage protocol, message will be closed.

## 2017-11-18 ENCOUNTER — Ambulatory Visit: Payer: Medicaid Other | Admitting: Gastroenterology

## 2018-01-14 ENCOUNTER — Other Ambulatory Visit (HOSPITAL_COMMUNITY): Payer: Self-pay | Admitting: *Deleted

## 2018-01-14 DIAGNOSIS — C189 Malignant neoplasm of colon, unspecified: Secondary | ICD-10-CM

## 2018-01-15 ENCOUNTER — Other Ambulatory Visit (HOSPITAL_COMMUNITY): Payer: Medicaid Other

## 2018-01-20 ENCOUNTER — Ambulatory Visit (HOSPITAL_COMMUNITY): Admission: RE | Admit: 2018-01-20 | Payer: Medicaid Other | Source: Ambulatory Visit

## 2018-01-22 ENCOUNTER — Ambulatory Visit (HOSPITAL_COMMUNITY): Payer: Medicaid Other | Admitting: Internal Medicine

## 2018-02-09 ENCOUNTER — Inpatient Hospital Stay (HOSPITAL_COMMUNITY): Payer: Medicaid Other | Attending: Hematology

## 2018-02-09 DIAGNOSIS — E119 Type 2 diabetes mellitus without complications: Secondary | ICD-10-CM | POA: Diagnosis not present

## 2018-02-09 DIAGNOSIS — R918 Other nonspecific abnormal finding of lung field: Secondary | ICD-10-CM | POA: Insufficient documentation

## 2018-02-09 DIAGNOSIS — C189 Malignant neoplasm of colon, unspecified: Secondary | ICD-10-CM

## 2018-02-09 DIAGNOSIS — I720 Aneurysm of carotid artery: Secondary | ICD-10-CM | POA: Diagnosis not present

## 2018-02-09 DIAGNOSIS — F209 Schizophrenia, unspecified: Secondary | ICD-10-CM | POA: Diagnosis not present

## 2018-02-09 DIAGNOSIS — Z87891 Personal history of nicotine dependence: Secondary | ICD-10-CM | POA: Insufficient documentation

## 2018-02-09 DIAGNOSIS — C182 Malignant neoplasm of ascending colon: Secondary | ICD-10-CM | POA: Insufficient documentation

## 2018-02-09 LAB — COMPREHENSIVE METABOLIC PANEL
ALK PHOS: 50 U/L (ref 38–126)
ALT: 14 U/L (ref 0–44)
AST: 21 U/L (ref 15–41)
Albumin: 3.7 g/dL (ref 3.5–5.0)
Anion gap: 7 (ref 5–15)
BILIRUBIN TOTAL: 0.4 mg/dL (ref 0.3–1.2)
BUN: 14 mg/dL (ref 8–23)
CALCIUM: 9.4 mg/dL (ref 8.9–10.3)
CHLORIDE: 106 mmol/L (ref 98–111)
CO2: 26 mmol/L (ref 22–32)
CREATININE: 0.89 mg/dL (ref 0.44–1.00)
Glucose, Bld: 105 mg/dL — ABNORMAL HIGH (ref 70–99)
Potassium: 4.3 mmol/L (ref 3.5–5.1)
Sodium: 139 mmol/L (ref 135–145)
TOTAL PROTEIN: 7.4 g/dL (ref 6.5–8.1)

## 2018-02-09 LAB — CBC WITH DIFFERENTIAL/PLATELET
BASOS ABS: 0 10*3/uL (ref 0.0–0.1)
Basophils Relative: 0 %
Eosinophils Absolute: 0.1 10*3/uL (ref 0.0–0.7)
Eosinophils Relative: 2 %
HEMATOCRIT: 38.4 % (ref 36.0–46.0)
Hemoglobin: 11.8 g/dL — ABNORMAL LOW (ref 12.0–15.0)
LYMPHS PCT: 31 %
Lymphs Abs: 1.7 10*3/uL (ref 0.7–4.0)
MCH: 25.6 pg — ABNORMAL LOW (ref 26.0–34.0)
MCHC: 30.7 g/dL (ref 30.0–36.0)
MCV: 83.3 fL (ref 78.0–100.0)
MONOS PCT: 7 %
Monocytes Absolute: 0.4 10*3/uL (ref 0.1–1.0)
NEUTROS ABS: 3.3 10*3/uL (ref 1.7–7.7)
Neutrophils Relative %: 60 %
Platelets: 165 10*3/uL (ref 150–400)
RBC: 4.61 MIL/uL (ref 3.87–5.11)
RDW: 15.5 % (ref 11.5–15.5)
WBC: 5.5 10*3/uL (ref 4.0–10.5)

## 2018-02-09 LAB — FERRITIN: Ferritin: 77 ng/mL (ref 11–307)

## 2018-02-10 LAB — CEA: CEA: 4.6 ng/mL (ref 0.0–4.7)

## 2018-02-12 ENCOUNTER — Ambulatory Visit (HOSPITAL_COMMUNITY)
Admission: RE | Admit: 2018-02-12 | Discharge: 2018-02-12 | Disposition: A | Discharge status: Home / Self Care | Payer: Medicaid Other | Source: Ambulatory Visit | Attending: Oncology | Admitting: Oncology

## 2018-02-12 DIAGNOSIS — R911 Solitary pulmonary nodule: Secondary | ICD-10-CM | POA: Diagnosis not present

## 2018-02-12 DIAGNOSIS — C189 Malignant neoplasm of colon, unspecified: Secondary | ICD-10-CM | POA: Insufficient documentation

## 2018-02-12 DIAGNOSIS — K8020 Calculus of gallbladder without cholecystitis without obstruction: Secondary | ICD-10-CM | POA: Insufficient documentation

## 2018-02-12 DIAGNOSIS — K760 Fatty (change of) liver, not elsewhere classified: Secondary | ICD-10-CM | POA: Insufficient documentation

## 2018-02-12 DIAGNOSIS — I700 Atherosclerosis of aorta: Secondary | ICD-10-CM | POA: Diagnosis not present

## 2018-02-12 DIAGNOSIS — J479 Bronchiectasis, uncomplicated: Secondary | ICD-10-CM | POA: Insufficient documentation

## 2018-02-12 DIAGNOSIS — I2510 Atherosclerotic heart disease of native coronary artery without angina pectoris: Secondary | ICD-10-CM | POA: Insufficient documentation

## 2018-02-12 MED ORDER — IOPAMIDOL (ISOVUE-300) INJECTION 61%
100.0000 mL | Freq: Once | INTRAVENOUS | Status: AC | PRN
  Administered 2018-02-12: 100 mL via INTRAVENOUS

## 2018-02-17 ENCOUNTER — Ambulatory Visit: Payer: Medicaid Other | Admitting: Gastroenterology

## 2018-02-17 NOTE — Progress Notes (Deleted)
Stage IIC colon cancer diagnosed in 2017. She has not had her 1 year surveillance colonoscopy.    Large fungating, exophytic tumor appears to be arising out of base of cecum/appendix, partially obscuring IC valve.   Right hemicolectomy June 2017 with resection of TI, liver biopsy. Path results with invasive well differentiated adenocarcinoma.  02/12/18 scans for surveillance with possible mass involving the mid rectum. Unable to determine if this was due to collapsed area or mass.

## 2018-02-19 ENCOUNTER — Inpatient Hospital Stay (HOSPITAL_BASED_OUTPATIENT_CLINIC_OR_DEPARTMENT_OTHER): Payer: Medicaid Other | Admitting: Internal Medicine

## 2018-02-19 ENCOUNTER — Encounter (HOSPITAL_COMMUNITY): Payer: Self-pay | Admitting: Internal Medicine

## 2018-02-19 VITALS — BP 115/54 | HR 52 | Temp 97.7°F | Resp 14 | Wt 169.5 lb

## 2018-02-19 DIAGNOSIS — R918 Other nonspecific abnormal finding of lung field: Secondary | ICD-10-CM

## 2018-02-19 DIAGNOSIS — E119 Type 2 diabetes mellitus without complications: Secondary | ICD-10-CM | POA: Diagnosis not present

## 2018-02-19 DIAGNOSIS — C182 Malignant neoplasm of ascending colon: Secondary | ICD-10-CM | POA: Diagnosis not present

## 2018-02-19 DIAGNOSIS — I272 Pulmonary hypertension, unspecified: Secondary | ICD-10-CM

## 2018-02-19 DIAGNOSIS — Z87891 Personal history of nicotine dependence: Secondary | ICD-10-CM

## 2018-02-19 DIAGNOSIS — F209 Schizophrenia, unspecified: Secondary | ICD-10-CM

## 2018-02-19 DIAGNOSIS — C189 Malignant neoplasm of colon, unspecified: Secondary | ICD-10-CM

## 2018-02-19 NOTE — Patient Instructions (Signed)
[place] at [facility] Discharge Instructions  You saw Dr. Walden Field.   Thank you for choosing [place] at [facility] to provide your oncology and hematology care.  To afford each patient quality time with our provider, please arrive at least 15 minutes before your scheduled appointment time.   If you have a lab appointment with the [facility] please come in thru the  Main Entrance and check in at the main information desk  You need to re-schedule your appointment should you arrive 10 or more minutes late.  We strive to give you quality time with our providers, and arriving late affects you and other patients whose appointments are after yours.  Also, if you no show three or more times for appointments you may be dismissed from the clinic at the providers discretion.     Again, thank you for choosing [facility].  Our hope is that these requests will decrease the amount of time that you wait before being seen by our physicians.       _____________________________________________________________  Should you have questions after your visit to [facility], please contact our office at [phone] between the hours of 8:30 a.m. and 4:30 p.m.  Voicemails left after 4:30 p.m. will not be returned until the following business day.  For prescription refill requests, have your pharmacy contact our office.       Resources For Cancer Patients and their Caregivers ? American Cancer Society: Can assist with transportation, wigs, general needs, runs Look Good Feel Better.        323-665-4447 ? Cancer Care: Provides financial assistance, online support groups, medication/co-pay assistance.  1-800-813-HOPE 219-307-3532) ? [place] Assists [place] cancer patients and their families through emotional , educational and financial support.  3640224825 ? [place] DSS Where to apply for food  stamps, Medicaid and utility assistance. 551-451-3556 ? [facility]: Transportation to medical appointments. (219) 645-0241 ? Social Security Administration: May apply for disability if have a Stage IV cancer. (864) 513-1171 9291361973 ? [employer], Disability and Transit Services: Assists with nutrition, care and transit needs. [place] Support Programs:   > Cancer Support Group  2nd Tuesday of the month 1pm-2pm, Journey Room   > Creative Journey  3rd Tuesday of the month 1130am-1pm, Journey Room

## 2018-02-19 NOTE — Progress Notes (Signed)
Diagnosis No diagnosis found.  Staging Cancer Staging Adenocarcinoma of colon [facility]) Staging form: Colon and Rectum, AJCC 7th Edition - Pathologic stage from 02/02/2016: Stage IIC (T4b, N0, cM0) - Signed by Baird Cancer, PA-C on 07/04/2016   Assessment and Plan:  1.  Stage IIC (T4bN0M0) adenocarcinoma of colon.  Pt was diagnosed in 01/2016; treated with right hemicolectomy with resection of terminal ileum; there was questionable liver lesion on imaging which was biopsied and negative for malignancy.  Post-surgery PET scan negative for residual or metastatic disease. There was notation of RUL pulmonary nodule that was stable and non-hypermetabolic on PET in 10/8754. CT chest imaging in 01/2017 revealed stable lung nodule with benign etiology being favored given stability x 1 year.   Pt had CT scans from 01/2017 that were negative for recurrent or metastatic disease.   She is here today for follow-up to go over CT CAP that was done 02/12/2018 reviewed and showed IMPRESSION:   CT Chest:  1.  No acute cardiopulmonary disease. 2. Stable 9 mm nodule in the POSTERIOR RIGHT UPPER LOBE dating back to August, 2017. Stability and lack of metabolic activity on the prior PET-CT indicates benignity. 3. No evidence of metastatic disease in the thorax. 4. Stable cylindrical bronchiectasis in the lower lobes, LEFT greater than RIGHT. 5. Stable mild atherosclerosis involving the LEFT main, LEFT circumflex and LAD coronary arteries.  CT Abdomen Pelvis:  1. Possible mass involving the mid rectum. While this may be due to the fact this segment is collapsed, a mass cannot be excluded based on its CT appearance. Please correlate with proctoscopy/sigmoidoscopy. 2. No evidence of metastatic disease involving the abdomen or pelvis. 3. Very large colonic stool burden which may indicate chronic constipation. 4. Cholelithiasis without evidence of acute cholecystitis. 5. Hepatic steatosis. 6. 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 7. Possible [name] fragment in the endometrial canal of the LOWER uterine segment and gas in the endometrial canal above the device, Stable.  Labs done 02/09/2018 reviewed with pt and shows HB 11.8, ferritin 77, CEA 4.6.  The pt was reportedly due to colonoscopy in 2018 but did not have that done.  Will refer back to GI for evaluation to determine if findings on recent scan confirm mass or if  Findings are postsurgical changes.  She will RTC in 4-6 weeks for follow-up after GI evaluation.    2.  RUL lung nodule is stable and dates back to 2017 and is felt likely benign.    3.  ? [name].  Will discuss with radiology as pt denies [name].  Will refer to GYN as family member unsure when last pelvic exam.    4.  Constipation.  Stool softeners.  Pt is referred to GI due to abnormal scan findings.    Greater than 35 minutes spent with more than 50% spent in counseling and coordination of care.    Interval History:  Historical data obtained from the note dated 09/22/2017.  62 yr old female previously followed by Dr. Whitney Muse for Stage IIC (T4bN0M0) adenocarcinoma of colon.  Pt was diagnosed in 01/2016; treated with right hemicolectomy with resection of terminal ileum; there was questionable liver lesion on imaging which was biopsied and negative for malignancy.  Post-surgery PET scan negative for residual or metastatic disease. There was notation of RUL pulmonary nodule that was stable and non-hypermetabolic on PET in 11/3327. CT chest imaging in 01/2017 revealed stable lung nodule with benign etiology being favored given stability  x 1 year.   CEA elevated at 38.8 at time of diagnosis; post-operative CEA levels have normalized.  Most recent available CEA normal at 4.2. CEA is pending at this time. -She was due for colonoscopy with Dr. Gala Romney and in August 8018 but unfortunately this was canceled and never rescheduled.  CT scans from 6018 were negative  for recurrent or metastatic disease. She is due for repeat CT scan in June 2019. Orders placed today. - Current Status:  Pt is seen today for follow-up to go over scans and labs.  When questioned she denies any blood in stool or urine, change in stool caliber.      Adenocarcinoma of colon ([place])   01/25/2016 Imaging    CT C/A/P large 9 cm mass within the colon at level of hepatic flexure, enlarged mesenteric LN concerning for metastatic disease, There is a nonspecific 13 mm nodule within the right hemipelvis which may represent a metastatic deposit. Alternatively, given the appearance, this may represent a small amount of loculated fluid. There are nonspecific low-attenuation lesions within the liver, the majority which are subcentimeter in size. Metastatic disease is not excluded.       01/25/2016 Procedure    Colonoscopy on 6/22 with Large fungating, exophytic tumor appears to be arising out of the base of cecum / appendix. It's partially obscuring the ileocecal valve.      01/29/2016 Surgery    R hemicolectomy with resection of terminal ileum, liver biopsy with Dr. Arnoldo Morale      01/29/2016 Pathology Results    INVASIVE WELL DIFFERENTIATED ADENOCARCINOMA WITH ABUNDANT EXTRACELLULAR MUCIN, SPANNING 19 CM IN GREATEST DIMENSION. - TUMOR INVADES THROUGH MUSCULARIS PROPRIA THROUGH THE SEROSA OF ONE BOWEL SEGMENT INTO A SECOND ADHESED SEGMENT OF BOWEL. - MARGINS ARE NEGATIVE.No extramural satellite tumor nodules seen, 0/27 LN, Liver biopsy with benign liver tissue      03/15/2016 PET scan    No abnormal metabolic activity status post interval right hemicolectomy. No evidence of residual or metastatic disease. The right upper lobe pulmonary nodule is stable and without hypermetabolic activity. This favors a benign etiology.        Problem List Patient Active Problem List   Diagnosis Date Noted  . Pulmonary hypertension, unspecified ([place]) [I27.20] 05/05/2017  . Colonic mass [K63.9]   .  Adenocarcinoma of colon ([place]) [C18.9]   . HLD (hyperlipidemia) [E78.5] 01/22/2016  . GERD (gastroesophageal reflux disease) [K21.9] 01/22/2016  . Encounter for screening for cervical cancer  [Z12.4] 12/21/2015  . Iron deficiency anemia [D50.9] 09/18/2013  . Diabetes mellitus ([place]) [E11.9] 09/15/2013  . Schizophrenia ([place]) [F20.9] 09/15/2013    Past Medical History Past Medical History:  Diagnosis Date  . Adenocarcinoma of colon ([place])   . Allergy   . Diabetes mellitus without complication ([place]) 4920  . GERD (gastroesophageal reflux disease)   . Iron deficiency anemia 09/18/2013  . Schizophrenia [facility])     Past Surgical History Past Surgical History:  Procedure Laterality Date  . BIOPSY  01/24/2016   Procedure: BIOPSY;  Surgeon: Daneil Dolin, MD;  [facility];  Service: Endoscopy;;  cecal mass  . COLONOSCOPY  04/21/2006   SLF: limited colonoscopy due to a poor bowel prep   . COLONOSCOPY  02/22/2009   SLF: poor bowel prep/Formed stools in the right colon which limited the extent of the exam/The scope was passed to approximately the proximal transverse colon.  No polyps, masses, inflammatory changes, diverticular/normal rectum  . COLONOSCOPY WITH PROPOFOL N/A 01/24/2016  Procedure: COLONOSCOPY WITH PROPOFOL;  Surgeon: Daneil Dolin, MD;  Location: [facility];  Service: Endoscopy;  Laterality: N/A;  . EXCISIONAL HEMORRHOIDECTOMY    . LIVER BIOPSY N/A 01/29/2016   Procedure: LIVER BIOPSY;  Surgeon: Aviva Signs, MD;  Location: [facility] ORS;  Service: General;  Laterality: N/A;  . PARTIAL COLECTOMY N/A 01/29/2016   Procedure: PARTIAL COLECTOMY ;  Surgeon: Aviva Signs, MD;  Location: [facility] ORS;  Service: General;  Laterality: N/A;  . PORTACATH PLACEMENT Left 01/29/2016   Procedure: INSERTION PORT-A-CATH;  Surgeon: Aviva Signs, MD;  Location: [facility] ORS;  Service: General;  Laterality: Left;  left subclavian    Family History Family History  Problem Relation Age of Onset  .  Leukemia Paternal Grandfather   . Leukemia Cousin   . Colon cancer Neg Hx      Social History  reports that she quit smoking about 20 years ago. Her smoking use included cigarettes. She has a 9.50 pack-year smoking history. She has never used smokeless tobacco. She reports that she does not drink alcohol or use drugs.  Medications  Current Outpatient Medications:  .  benztropine (COGENTIN) 2 MG tablet, Take 1 mg by mouth 2 (two) times daily. , Disp: , Rfl:  .  clonazePAM (KLONOPIN) 0.5 MG tablet, Take 0.5 mg by mouth 2 (two) times daily. , Disp: , Rfl:  .  divalproex (DEPAKOTE ER) 250 MG 24 hr tablet, Take 250 mg by mouth at bedtime., Disp: , Rfl:  .  docusate sodium (COLACE) 100 MG capsule, Take 100 mg by mouth daily as needed for mild constipation., Disp: , Rfl:  .  famotidine (PEPCID) 20 MG tablet, Take 20 mg by mouth daily., Disp: , Rfl:  .  ferrous sulfate 325 (65 FE) MG tablet, Take 325 mg by mouth 2 (two) times daily with a meal., Disp: , Rfl:  .  fluPHENAZine (PROLIXIN) 5 MG tablet, Take 5 mg by mouth 2 (two) times daily., Disp: , Rfl:  .  loratadine (CLARITIN) 10 MG tablet, Take 10 mg by mouth daily., Disp: , Rfl:  .  metFORMIN (GLUCOPHAGE) 500 MG tablet, Take 500 mg by mouth 2 (two) times daily with a meal. , Disp: , Rfl:  .  NON FORMULARY, Take 1 capsule by mouth 2 (two) times daily. Fiber capsules 520 mg., Disp: , Rfl:  .  polyethylene glycol-electrolytes (TRILYTE) 420 g solution, Take 4,000 mLs by mouth as directed., Disp: 4000 mL, Rfl: 0 .  simvastatin (ZOCOR) 20 MG tablet, Take 20 mg by mouth daily., Disp: , Rfl:   Allergies Patient has no known allergies.  Review of Systems Review of Systems - Oncology ROS negative other than constipation   Physical Exam  Vitals Wt Readings from Last 3 Encounters:  02/19/18 169 lb 8 oz (76.9 kg)  09/22/17 165 lb 12.8 oz (75.2 kg)  05/05/17 159 lb 9.6 oz (72.4 kg)   Temp Readings from Last 3 Encounters:  02/19/18 97.7 F (36.5  C) (Oral)  09/22/17 98.7 F (37.1 C) (Oral)  02/19/17 97.7 F (36.5 C) (Oral)   BP Readings from Last 3 Encounters:  02/19/18 (!) 115/54  09/22/17 (!) 133/53  05/05/17 116/74   Pulse Readings from Last 3 Encounters:  02/19/18 (!) 52  09/22/17 (!) 56  05/05/17 (!) 52   Constitutional: Well-developed, well-nourished, and in no distress.   HENT: Head: Normocephalic and atraumatic.  Mouth/Throat: No oropharyngeal exudate. Mucosa moist. Eyes: Pupils are equal, round, and reactive to light. Conjunctivae are  normal. No scleral icterus.  Neck: Normal range of motion. Neck supple. No JVD present.  Cardiovascular: Normal rate, regular rhythm and normal heart sounds.  Exam reveals no gallop and no friction rub.   No murmur heard. Pulmonary/Chest: Effort normal and breath sounds normal. No respiratory distress. No wheezes.No rales.  Abdominal: Soft. Bowel sounds are normal. No distension. There is no tenderness. There is no guarding.  Musculoskeletal: No edema or tenderness.  Lymphadenopathy: No cervical, axillary or supraclavicular adenopathy.  Neurological: Alert and oriented to person, place, and time. No cranial nerve deficit.  Skin: Skin is warm and dry. No rash noted. No erythema. No pallor.  Psychiatric: Affect and judgment normal.   Labs No visits with results within 3 Day(s) from this visit.  Latest known visit with results is:  Appointment on 02/09/2018  Component Date Value Ref Range Status  . WBC 02/09/2018 5.5  4.0 - 10.5 K/uL Final  . RBC 02/09/2018 4.61  3.87 - 5.11 MIL/uL Final  . Hemoglobin 02/09/2018 11.8* 12.0 - 15.0 g/dL Final  . HCT 02/09/2018 38.4  36.0 - 46.0 % Final  . MCV 02/09/2018 83.3  78.0 - 100.0 fL Final  . MCH 02/09/2018 25.6* 26.0 - 34.0 pg Final  . MCHC 02/09/2018 30.7  30.0 - 36.0 g/dL Final  . RDW 02/09/2018 15.5  11.5 - 15.5 % Final  . Platelets 02/09/2018 165  150 - 400 K/uL Final  . Neutrophils Relative % 02/09/2018 60  % Final  . Neutro Abs  02/09/2018 3.3  1.7 - 7.7 K/uL Final  . Lymphocytes Relative 02/09/2018 31  % Final  . Lymphs Abs 02/09/2018 1.7  0.7 - 4.0 K/uL Final  . Monocytes Relative 02/09/2018 7  % Final  . Monocytes Absolute 02/09/2018 0.4  0.1 - 1.0 K/uL Final  . Eosinophils Relative 02/09/2018 2  % Final  . Eosinophils Absolute 02/09/2018 0.1  0.0 - 0.7 K/uL Final  . Basophils Relative 02/09/2018 0  % Final  . Basophils Absolute 02/09/2018 0.0  0.0 - 0.1 K/uL Final   Performed at [facility], [address], [place], [place] [address]  . Sodium 02/09/2018 139  135 - 145 mmol/L Final  . Potassium 02/09/2018 4.3  3.5 - 5.1 mmol/L Final  . Chloride 02/09/2018 106  98 - 111 mmol/L Final   Please note change in reference range.  . CO2 02/09/2018 26  22 - 32 mmol/L Final  . Glucose, Bld 02/09/2018 105* 70 - 99 mg/dL Final   Please note change in reference range.  . BUN 02/09/2018 14  8 - 23 mg/dL Final   Please note change in reference range.  . Creatinine, Ser 02/09/2018 0.89  0.44 - 1.00 mg/dL Final  . Calcium 02/09/2018 9.4  8.9 - 10.3 mg/dL Final  . Total Protein 02/09/2018 7.4  6.5 - 8.1 g/dL Final  . Albumin 02/09/2018 3.7  3.5 - 5.0 g/dL Final  . AST 02/09/2018 21  15 - 41 U/L Final  . ALT 02/09/2018 14  0 - 44 U/L Final   Please note change in reference range.  . Alkaline Phosphatase 02/09/2018 50  38 - 126 U/L Final  . Total Bilirubin 02/09/2018 0.4  0.3 - 1.2 mg/dL Final  . GFR calc non Af Amer 02/09/2018 >60  >60 mL/min Final  . GFR calc Af Amer 02/09/2018 >60  >60 mL/min Final   Comment: (NOTE) The eGFR has been calculated using the CKD EPI equation. This calculation has not been validated  in all clinical situations. eGFR's persistently <60 mL/min signify possible Chronic Kidney Disease.   Georgiann Hahn gap 02/09/2018 7  5 - 15 Final   Performed at [facility], [address], [place], [place] [address]  . CEA 02/09/2018 4.6  0.0 - 4.7 ng/mL Final   Comment: (NOTE)                              Nonsmokers          <3.9                             Smokers             <5.6 [employer] Electrochemiluminescence Immunoassay (ECLIA) Values obtained with different assay methods or kits cannot be used interchangeably.  Results cannot be interpreted as absolute evidence of the presence or absence of malignant disease. Performed At: [facility] [place], [place] [id number] Rush Farmer MD AN:[id number] Performed at [facility], [address], [place], [place] [address]   . Ferritin 02/09/2018 77  11 - 307 ng/mL Final   Performed at [facility] [address], [place], [place] [address]     Pathology No orders of the defined types were placed in this encounter.      Zoila Shutter MD

## 2018-02-24 ENCOUNTER — Ambulatory Visit: Payer: Medicaid Other | Admitting: Nurse Practitioner

## 2018-03-23 ENCOUNTER — Ambulatory Visit (HOSPITAL_COMMUNITY): Payer: Medicaid Other | Admitting: Internal Medicine

## 2018-05-22 ENCOUNTER — Ambulatory Visit: Payer: Medicaid Other | Admitting: Gastroenterology

## 2018-05-26 ENCOUNTER — Telehealth: Payer: Self-pay | Admitting: Gastroenterology

## 2018-05-26 ENCOUNTER — Ambulatory Visit: Payer: Medicaid Other | Admitting: Gastroenterology

## 2018-05-26 ENCOUNTER — Encounter

## 2018-05-26 ENCOUNTER — Encounter: Payer: Self-pay | Admitting: Gastroenterology

## 2018-05-26 NOTE — Telephone Encounter (Signed)
Patient was a no show and letter sent  °

## 2018-05-29 ENCOUNTER — Ambulatory Visit (HOSPITAL_COMMUNITY): Payer: Medicaid Other | Admitting: Internal Medicine

## 2018-06-22 NOTE — Progress Notes (Signed)
Received telephone call from [facility] requesting that patient schedule an appointment to have port flushed.  Unclear, by looking at chart and speaking with staff as to if and when this port was last flushed.  Was placed in 6 of 2017.  Spoke to Cancer center and requested that they make her an appointment for assessment of port and determine further treatment, as she is a patient of Dr Walden Field.

## 2018-06-24 ENCOUNTER — Encounter: Payer: Self-pay | Admitting: Gastroenterology

## 2018-06-24 ENCOUNTER — Encounter (HOSPITAL_COMMUNITY): Payer: Medicaid Other

## 2018-07-14 ENCOUNTER — Telehealth (HOSPITAL_COMMUNITY): Payer: Self-pay | Admitting: *Deleted

## 2018-07-15 ENCOUNTER — Other Ambulatory Visit: Payer: Self-pay

## 2018-07-15 ENCOUNTER — Encounter (HOSPITAL_COMMUNITY): Admission: RE | Admit: 2018-07-15 | Payer: Medicaid Other | Source: Ambulatory Visit

## 2018-07-15 ENCOUNTER — Encounter (HOSPITAL_COMMUNITY): Payer: Self-pay

## 2018-07-15 ENCOUNTER — Other Ambulatory Visit (HOSPITAL_COMMUNITY): Payer: Self-pay | Admitting: *Deleted

## 2018-07-15 ENCOUNTER — Inpatient Hospital Stay (HOSPITAL_COMMUNITY): Payer: Medicaid Other | Attending: Hematology

## 2018-07-15 DIAGNOSIS — C182 Malignant neoplasm of ascending colon: Secondary | ICD-10-CM | POA: Diagnosis not present

## 2018-07-15 DIAGNOSIS — Z452 Encounter for adjustment and management of vascular access device: Secondary | ICD-10-CM | POA: Diagnosis not present

## 2018-07-15 MED ORDER — SODIUM CHLORIDE 0.9% FLUSH
10.0000 mL | INTRAVENOUS | Status: DC | PRN
  Administered 2018-07-15: 10 mL via INTRAVENOUS
  Filled 2018-07-15: qty 10

## 2018-07-15 MED ORDER — HEPARIN SOD (PORK) LOCK FLUSH 100 UNIT/ML IV SOLN
500.0000 [IU] | Freq: Once | INTRAVENOUS | Status: AC
  Administered 2018-07-15: 500 [IU] via INTRAVENOUS
  Filled 2018-07-15: qty 5

## 2018-07-15 NOTE — Progress Notes (Signed)
Attempted medication reconcilliation with pt by calling out names of medications to pt and she confirming whether or not she takes them.  She states, "I don't know what I take."  Pt did not bring her meds with her to the clinic.   Mary Wells presented for Portacath access and flush.  Proper placement of portacath confirmed by CXR.  Portacath located left chest wall accessed with  H 20 needle.  Good blood return present. Portacath flushed with 70ml NS and 500U/59ml Heparin and needle removed intact.  Procedure tolerated well and without incident.  Discharged ambulatory.

## 2018-08-12 ENCOUNTER — Encounter (HOSPITAL_COMMUNITY): Payer: Self-pay

## 2018-08-12 ENCOUNTER — Ambulatory Visit (HOSPITAL_COMMUNITY)
Admission: RE | Admit: 2018-08-12 | Discharge: 2018-08-12 | Disposition: A | Discharge status: Home / Self Care | Payer: Medicaid Other | Source: Ambulatory Visit | Attending: Internal Medicine | Admitting: Internal Medicine

## 2018-08-12 DIAGNOSIS — Z1231 Encounter for screening mammogram for malignant neoplasm of breast: Secondary | ICD-10-CM | POA: Diagnosis present

## 2018-08-17 ENCOUNTER — Other Ambulatory Visit (HOSPITAL_COMMUNITY): Payer: Self-pay | Admitting: Internal Medicine

## 2018-08-17 DIAGNOSIS — R921 Mammographic calcification found on diagnostic imaging of breast: Secondary | ICD-10-CM

## 2018-09-01 ENCOUNTER — Encounter (HOSPITAL_COMMUNITY): Payer: Medicaid Other

## 2018-09-03 ENCOUNTER — Ambulatory Visit (INDEPENDENT_AMBULATORY_CARE_PROVIDER_SITE_OTHER): Payer: Medicaid Other | Admitting: Gastroenterology

## 2018-09-03 ENCOUNTER — Encounter: Payer: Self-pay | Admitting: Gastroenterology

## 2018-09-03 ENCOUNTER — Telehealth: Payer: Self-pay | Admitting: *Deleted

## 2018-09-03 ENCOUNTER — Encounter: Payer: Self-pay | Admitting: *Deleted

## 2018-09-03 VITALS — BP 102/59 | HR 50 | Temp 97.1°F | Ht 64.0 in | Wt 169.0 lb

## 2018-09-03 DIAGNOSIS — C189 Malignant neoplasm of colon, unspecified: Secondary | ICD-10-CM | POA: Diagnosis not present

## 2018-09-03 NOTE — Patient Instructions (Signed)
We are arranging a colonoscopy with Dr. Gala Romney in the near future. You will need to be admitted the day before to start the prep.  We also need you to only have clear liquids 2 days prior to the colonoscopy.  Further recommendations to follow!  It was a pleasure to see you today. I strive to create trusting relationships with patients to provide genuine, compassionate, and quality care. I value your feedback. If you receive a survey regarding your visit,  I greatly appreciate you taking time to fill this out.   Annitta Needs, PhD, ANP-BC [facility]

## 2018-09-03 NOTE — Progress Notes (Signed)
Referring Provider: Rosita Fire, MD Primary Care Physician:  Rosita Fire, MD Primary GI: Dr. Gala Romney   Chief Complaint  Patient presents with  . h/o colon cancer    HPI:   Mary Wells is a 63 y.o. female presenting today with a history of stage IIc adenocarcinoma of colon and IDA. Diagnosed in 2017. Colonoscopy on January 24, 2016 showed large fungating, exophytic tumor arising out of the base of the cecum/appendix. Partially obscuring the ileocecal valve. She had a right hemicolectomy with resection of the terminal ileum, liver biopsy by Dr. Arnoldo Morale on 01/29/2016. Liver biopsy was benign. She has not had a surveillance colonoscopy.   CT abdomen pelvis with contrast July 2019 with possible mass involving mid rectum but could be related to collapsed segment, very large colonic stool burden. Resides at [place] from home is present. Sometimes feels a little nauseated every now and then "not much, just a tiny bit". BM every day. No rectal bleeding. Good appetite. Vaughan Basta states appetite is "super". No reflux. Patient has been known to eat snacks in the middle of the night. Concern remains regarding prepping prior to procedure if done as outpatient.   Discussed with Vaughan Basta, who states patient signs own consents.    Past Medical History:  Diagnosis Date  . Adenocarcinoma of colon ([place])   . Allergy   . Diabetes mellitus without complication ([place]) 0160  . GERD (gastroesophageal reflux disease)   . Iron deficiency anemia 09/18/2013  . Schizophrenia [facility])     Past Surgical History:  Procedure Laterality Date  . BIOPSY  01/24/2016   Procedure: BIOPSY;  Surgeon: Daneil Dolin, MD;  [facility];  Service: Endoscopy;;  cecal mass  . COLONOSCOPY  04/21/2006   SLF: limited colonoscopy due to a poor bowel prep   . COLONOSCOPY  02/22/2009   SLF: poor bowel prep/Formed stools in the right colon which limited the extent of the exam/The scope was passed to  approximately the proximal transverse colon.  No polyps, masses, inflammatory changes, diverticular/normal rectum  . COLONOSCOPY WITH PROPOFOL N/A 01/24/2016   Dr. Gala Romney: large fungating, exophytic tumor arising out of the base of the cecum/appendix. Partially obscuring the ileocecal valve  . EXCISIONAL HEMORRHOIDECTOMY    . LIVER BIOPSY N/A 01/29/2016   Procedure: LIVER BIOPSY;  Surgeon: Aviva Signs, MD;  Location: [facility] ORS;  Service: General;  Laterality: N/A;  . PARTIAL COLECTOMY N/A 01/29/2016   Procedure: PARTIAL COLECTOMY ;  Surgeon: Aviva Signs, MD;  Location: [facility] ORS;  Service: General;  Laterality: N/A;  . PORTACATH PLACEMENT Left 01/29/2016   Procedure: INSERTION PORT-A-CATH;  Surgeon: Aviva Signs, MD;  Location: [facility] ORS;  Service: General;  Laterality: Left;  left subclavian    Current Outpatient Medications  Medication Sig Dispense Refill  . benztropine (COGENTIN) 2 MG tablet Take 1 mg by mouth 2 (two) times daily.     . clonazePAM (KLONOPIN) 0.5 MG tablet Take 0.5 mg by mouth 2 (two) times daily.     . divalproex (DEPAKOTE ER) 250 MG 24 hr tablet Take 250 mg by mouth at bedtime.    . docusate sodium (COLACE) 100 MG capsule Take 100 mg by mouth daily as needed for mild constipation.    . famotidine (PEPCID) 20 MG tablet Take 20 mg by mouth daily.    . ferrous sulfate 325 (65 FE) MG tablet Take 325 mg by mouth 2 (two) times daily with a meal.    .  fluPHENAZine (PROLIXIN) 5 MG tablet Take 5 mg by mouth 2 (two) times daily.    Marland Kitchen loratadine (CLARITIN) 10 MG tablet Take 10 mg by mouth daily.    . metFORMIN (GLUCOPHAGE) 500 MG tablet Take 500 mg by mouth 2 (two) times daily with a meal.     . NON FORMULARY Take 1 capsule by mouth 2 (two) times daily. Fiber capsules 520 mg.    . simvastatin (ZOCOR) 20 MG tablet Take 20 mg by mouth daily.     No current facility-administered medications for this visit.     Allergies as of 09/03/2018  . (No Known Allergies)    Family History  Problem  Relation Age of Onset  . Leukemia Paternal Grandfather   . Leukemia Cousin   . Colon cancer Neg Hx     Social History   Socioeconomic History  . Marital status: Single    Spouse name: Not on file  . Number of children: Not on file  . Years of education: Not on file  . Highest education level: Not on file  Occupational History  . Not on file  Social Needs  . Financial resource strain: Not on file  . Food insecurity:    Worry: Not on file    Inability: Not on file  . Transportation needs:    Medical: Not on file    Non-medical: Not on file  Tobacco Use  . Smoking status: Current Every Day Smoker    Packs/day: 0.50    Years: 19.00    Pack years: 9.50    Types: Cigarettes  . Smokeless tobacco: Never Used  . Tobacco comment: 3 cigs daily  Substance and Sexual Activity  . Alcohol use: No    Alcohol/week: 0.0 standard drinks  . Drug use: No  . Sexual activity: Never  Lifestyle  . Physical activity:    Days per week: Not on file    Minutes per session: Not on file  . Stress: Not on file  Relationships  . Social connections:    Talks on phone: Not on file    Gets together: Not on file    Attends religious service: Not on file    Active member of club or organization: Not on file    Attends meetings of clubs or organizations: Not on file    Relationship status: Not on file  Other Topics Concern  . Not on file  Social History Narrative  . Not on file    Review of Systems: Gen: Denies fever, chills, anorexia. Denies fatigue, weakness, weight loss.  CV: Denies chest pain, palpitations, syncope, peripheral edema, and claudication. Resp: Denies dyspnea at rest, cough, wheezing, coughing up blood, and pleurisy. GI: see HPI  Derm: Denies rash, itching, dry skin Psych: Denies depression, anxiety, memory loss, confusion. No homicidal or suicidal ideation.  Heme: Denies bruising, bleeding, and enlarged lymph nodes.  Physical Exam: BP (!) 102/59   Pulse (!) 50   Temp  (!) 97.1 F (36.2 C) (Oral)   Ht 5\' 4"  (1.626 m)   Wt 169 lb (76.7 kg)   BMI 29.01 kg/m  General:   Alert and oriented. No distress noted. Pleasant and cooperative.  Head:  Normocephalic and atraumatic. Eyes:  Conjuctiva clear without scleral icterus. Mouth:  Oral mucosa pink and moist. Good dentition. No lesions. Abdomen:  +BS, soft, non-tender and non-distended. No rebound or guarding. No HSM or masses noted. Msk:  Symmetrical without gross deformities. Normal posture. Extremities:  Without edema. Neurologic:  Alert and  oriented x4 Psych:  Alert and cooperative. Normal mood and affect.  Lab Results  Component Value Date   WBC 5.5 02/09/2018   HGB 11.8 (L) 02/09/2018   HCT 38.4 02/09/2018   MCV 83.3 02/09/2018   PLT 165 02/09/2018   Lab Results  Component Value Date   ALT 14 02/09/2018   AST 21 02/09/2018   ALKPHOS 50 02/09/2018   BILITOT 0.4 02/09/2018   Lab Results  Component Value Date   CREATININE 0.89 02/09/2018   BUN 14 02/09/2018   NA 139 02/09/2018   K 4.3 02/09/2018   CL 106 02/09/2018   CO2 26 02/09/2018

## 2018-09-03 NOTE — Telephone Encounter (Signed)
Patient scheduled for TCS with propofol on 10/01/2018 at 2:15pm. Fowarding to AB so she can speak with hospitalist.

## 2018-09-07 ENCOUNTER — Encounter: Payer: Self-pay | Admitting: Gastroenterology

## 2018-09-07 ENCOUNTER — Encounter: Payer: Self-pay | Admitting: *Deleted

## 2018-09-07 NOTE — Telephone Encounter (Signed)
Spoke with [facility]. Also called facility and made aware. Faxed order to [phone]

## 2018-09-07 NOTE — Telephone Encounter (Signed)
Mary Wells: does this mean admissions knows to have a bed for her on Wednesday, the day prior? Can we also have her hold iron 7 days before procedure.

## 2018-09-07 NOTE — Assessment & Plan Note (Signed)
63 year old female diagnosed with stage IIc adenocarcinoma of colon in June 2017, with  large fungating, exophytic tumor arising out of the base of the cecum/appendix. Partially obscuring the ileocecal valve. She had a right hemicolectomy with resection of the terminal ileum, liver biopsy by Dr. Arnoldo Morale on 01/29/2016. Liver biopsy was benign. She has not had her one year surveillance colonoscopy. Although CT abd/pelvis with contrast July 2019 showed possible mass involving mid rectum, unable to rule out collapsed segment. Clinically, she is without any concerning lower or upper GI signs/symptoms. [facility], and she will need 23 hour observation admission due to difficulty with preps previously and non-compliance with clear liquids (eats snacks at night). Will need Propofol due to polypharmacy.  Admit day before colonoscopy to [facility]. Start clear liquids 2 days prior Recommending Golytely to start as soon as admitted due to possible need for additional 2 liters the following morning. Proceed with TCS with Dr. Gala Romney in near future: the risks, benefits, and alternatives have been discussed with the patient in detail. The patient states understanding and desires to proceed. Plans for admission 09/30/18 with TCS on 10/01/18 with Propofol. As of note, patient is able to sign her own consent.  Hold iron 7 days prior.

## 2018-09-08 ENCOUNTER — Ambulatory Visit (HOSPITAL_COMMUNITY)
Admission: RE | Admit: 2018-09-08 | Discharge: 2018-09-08 | Disposition: A | Discharge status: Home / Self Care | Payer: Medicaid Other | Source: Ambulatory Visit | Attending: Internal Medicine | Admitting: Internal Medicine

## 2018-09-08 DIAGNOSIS — R921 Mammographic calcification found on diagnostic imaging of breast: Secondary | ICD-10-CM | POA: Diagnosis not present

## 2018-09-08 NOTE — Progress Notes (Signed)
cc'ed to pcp °

## 2018-09-09 ENCOUNTER — Inpatient Hospital Stay (HOSPITAL_COMMUNITY): Payer: Medicaid Other | Attending: Hematology

## 2018-09-09 ENCOUNTER — Encounter (HOSPITAL_COMMUNITY): Payer: Self-pay

## 2018-09-09 DIAGNOSIS — Z79899 Other long term (current) drug therapy: Secondary | ICD-10-CM | POA: Insufficient documentation

## 2018-09-09 DIAGNOSIS — C182 Malignant neoplasm of ascending colon: Secondary | ICD-10-CM | POA: Diagnosis present

## 2018-09-09 DIAGNOSIS — R918 Other nonspecific abnormal finding of lung field: Secondary | ICD-10-CM | POA: Diagnosis not present

## 2018-09-09 DIAGNOSIS — F17210 Nicotine dependence, cigarettes, uncomplicated: Secondary | ICD-10-CM | POA: Diagnosis not present

## 2018-09-09 DIAGNOSIS — E119 Type 2 diabetes mellitus without complications: Secondary | ICD-10-CM | POA: Insufficient documentation

## 2018-09-09 DIAGNOSIS — F209 Schizophrenia, unspecified: Secondary | ICD-10-CM | POA: Diagnosis not present

## 2018-09-09 DIAGNOSIS — R933 Abnormal findings on diagnostic imaging of other parts of digestive tract: Secondary | ICD-10-CM | POA: Diagnosis not present

## 2018-09-09 MED ORDER — SODIUM CHLORIDE 0.9% FLUSH
10.0000 mL | INTRAVENOUS | Status: DC | PRN
  Administered 2018-09-09: 10 mL via INTRAVENOUS
  Filled 2018-09-09: qty 10

## 2018-09-09 MED ORDER — HEPARIN SOD (PORK) LOCK FLUSH 100 UNIT/ML IV SOLN
500.0000 [IU] | Freq: Once | INTRAVENOUS | Status: AC
  Administered 2018-09-09: 500 [IU] via INTRAVENOUS

## 2018-09-09 NOTE — Progress Notes (Signed)
Mary Wells tolerated portacath flush well without complaints or incident. VSS Port accessed with 20 gauge needle with blood return noted then flushed with 10 ml NS and 5 ml Heparin easily per protocol then de-accessed. Pt discharged self ambulatory in satisfactory condition accompanied by a caregiver

## 2018-09-09 NOTE — Patient Instructions (Signed)
[facility] at [facility] Discharge Instructions  Portacath flushed per protocol today. Follow-up as scheduled. Call clinic for any questions or concerns   Thank you for choosing [facility] at [facility] to provide your oncology and hematology care.  To afford each patient quality time with our provider, please arrive at least 15 minutes before your scheduled appointment time.   If you have a lab appointment with the Cancer Center please come in thru the  Main Entrance and check in at the main information desk  You need to re-schedule your appointment should you arrive 10 or more minutes late.  We strive to give you quality time with our providers, and arriving late affects you and other patients whose appointments are after yours.  Also, if you no show three or more times for appointments you may be dismissed from the clinic at the providers discretion.     Again, thank you for choosing [facility].  Our hope is that these requests will decrease the amount of time that you wait before being seen by our physicians.       _____________________________________________________________  Should you have questions after your visit to [facility], please contact our office at [phone] between the hours of 8:00 a.m. and 4:30 p.m.  Voicemails left after 4:00 p.m. will not be returned until the following business day.  For prescription refill requests, have your pharmacy contact our office and allow 72 hours.    Cancer Center Support Programs:   > Cancer Support Group  2nd Tuesday of the month 1pm-2pm, Journey Room   

## 2018-09-16 ENCOUNTER — Inpatient Hospital Stay (HOSPITAL_BASED_OUTPATIENT_CLINIC_OR_DEPARTMENT_OTHER): Payer: Medicaid Other | Admitting: Internal Medicine

## 2018-09-16 ENCOUNTER — Inpatient Hospital Stay (HOSPITAL_COMMUNITY): Payer: Medicaid Other

## 2018-09-16 ENCOUNTER — Encounter (HOSPITAL_COMMUNITY): Payer: Self-pay | Admitting: Internal Medicine

## 2018-09-16 VITALS — BP 110/80 | HR 83 | Temp 97.7°F | Resp 18 | Wt 166.2 lb

## 2018-09-16 DIAGNOSIS — R918 Other nonspecific abnormal finding of lung field: Secondary | ICD-10-CM

## 2018-09-16 DIAGNOSIS — E119 Type 2 diabetes mellitus without complications: Secondary | ICD-10-CM

## 2018-09-16 DIAGNOSIS — C189 Malignant neoplasm of colon, unspecified: Secondary | ICD-10-CM

## 2018-09-16 DIAGNOSIS — C182 Malignant neoplasm of ascending colon: Secondary | ICD-10-CM

## 2018-09-16 DIAGNOSIS — R933 Abnormal findings on diagnostic imaging of other parts of digestive tract: Secondary | ICD-10-CM

## 2018-09-16 LAB — COMPREHENSIVE METABOLIC PANEL
ALT: 16 U/L (ref 0–44)
ANION GAP: 10 (ref 5–15)
AST: 24 U/L (ref 15–41)
Albumin: 3.9 g/dL (ref 3.5–5.0)
Alkaline Phosphatase: 51 U/L (ref 38–126)
BUN: 17 mg/dL (ref 8–23)
CO2: 22 mmol/L (ref 22–32)
Calcium: 9.9 mg/dL (ref 8.9–10.3)
Chloride: 107 mmol/L (ref 98–111)
Creatinine, Ser: 0.79 mg/dL (ref 0.44–1.00)
GFR calc Af Amer: >60 mL/min (ref >60)
GFR calc non Af Amer: >60 mL/min (ref >60)
Glucose, Bld: 150 mg/dL — ABNORMAL HIGH (ref 70–99)
Potassium: 4.6 mmol/L (ref 3.5–5.1)
SODIUM: 139 mmol/L (ref 135–145)
Total Bilirubin: 0.4 mg/dL (ref 0.3–1.2)
Total Protein: 8 g/dL (ref 6.5–8.1)

## 2018-09-16 LAB — CBC WITH DIFFERENTIAL/PLATELET
Abs Immature Granulocytes: 0.01 10*3/uL (ref 0.00–0.07)
BASOS ABS: 0 10*3/uL (ref 0.0–0.1)
BASOS PCT: 0 %
Eosinophils Absolute: 0.1 10*3/uL (ref 0.0–0.5)
Eosinophils Relative: 2 %
HCT: 40.9 % (ref 36.0–46.0)
Hemoglobin: 12.3 g/dL (ref 12.0–15.0)
Immature Granulocytes: 0 %
Lymphocytes Relative: 33 %
Lymphs Abs: 1.8 10*3/uL (ref 0.7–4.0)
MCH: 24.4 pg — ABNORMAL LOW (ref 26.0–34.0)
MCHC: 30.1 g/dL (ref 30.0–36.0)
MCV: 81.2 fL (ref 80.0–100.0)
Monocytes Absolute: 0.5 10*3/uL (ref 0.1–1.0)
Monocytes Relative: 9 %
NRBC: 0 % (ref 0.0–0.2)
Neutro Abs: 3.1 10*3/uL (ref 1.7–7.7)
Neutrophils Relative %: 56 %
Platelets: 172 10*3/uL (ref 150–400)
RBC: 5.04 MIL/uL (ref 3.87–5.11)
RDW: 15.8 % — ABNORMAL HIGH (ref 11.5–15.5)
WBC: 5.6 10*3/uL (ref 4.0–10.5)

## 2018-09-16 LAB — FERRITIN: Ferritin: 92 ng/mL (ref 11–307)

## 2018-09-16 LAB — LACTATE DEHYDROGENASE: LDH: 119 U/L (ref 98–192)

## 2018-09-16 NOTE — Progress Notes (Signed)
Diagnosis Adenocarcinoma of colon ([place]) - Plan: CBC with Differential/Platelet, Comprehensive metabolic panel, Lactate dehydrogenase, Ferritin, CEA  Abnormal findings on diagnostic imaging of other parts of digestive tract - Plan: CBC with Differential/Platelet, Comprehensive metabolic panel, Lactate dehydrogenase, Ferritin, CEA, CT CHEST W CONTRAST, CT ABDOMEN PELVIS W CONTRAST  Staging Cancer Staging Adenocarcinoma of colon [facility]) Staging form: Colon and Rectum, AJCC 7th Edition - Pathologic stage from 02/02/2016: Stage IIC (T4b, N0, cM0) - Signed by Baird Cancer, PA-C on 07/04/2016   Assessment and Plan:  1.  Stage IIC (T4bN0M0) adenocarcinoma of colon.  Pt was diagnosed in 01/2016; treated with right hemicolectomy with resection of terminal ileum; there was questionable liver lesion on imaging which was biopsied and negative for malignancy.  Post-surgery PET scan negative for residual or metastatic disease. There was notation of RUL pulmonary nodule that was stable and non-hypermetabolic on PET in 03/3150. CT chest imaging in 01/2017 revealed stable lung nodule with benign etiology being favored given stability x 1 year.   Pt had CT scans from 01/2017 that were negative for recurrent or metastatic disease.   CT CAP that was done 02/12/2018 showed IMPRESSION:   CT Chest:  1.  No acute cardiopulmonary disease. 2. Stable 9 mm nodule in the POSTERIOR RIGHT UPPER LOBE dating back to August, 2017. Stability and lack of metabolic activity on the prior PET-CT indicates benignity. 3. No evidence of metastatic disease in the thorax. 4. Stable cylindrical bronchiectasis in the lower lobes, LEFT greater than RIGHT. 5. Stable mild atherosclerosis involving the LEFT main, LEFT circumflex and LAD coronary arteries.  CT Abdomen Pelvis:  1. Possible mass involving the mid rectum. While this may be due to the fact this segment is collapsed, a mass cannot be excluded based on its CT appearance.  Please correlate with proctoscopy/sigmoidoscopy. 2. No evidence of metastatic disease involving the abdomen or pelvis. 3. Very large colonic stool burden which may indicate chronic constipation. 4. Cholelithiasis without evidence of acute cholecystitis. 5. Hepatic steatosis. 6. 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 7. Possible [name] fragment in the endometrial canal of the LOWER uterine segment and gas in the endometrial canal above the device, Stable.  Pt was referred to GI and had multiple appointments and failed to keep follow-up.  She has only recently been seen by GI in 08/2018 and is set up for colonoscopy reportedly 10/01/2018.    I have discussed with caretaker pt has been noncompliant with recommended GI evaluation and based on the length of time from last scan she is set up for CT CAP for restaging evaluation and reevaluation of reported mass on  Prior imaging done 02/2018.    Labs done 09/16/2018 reviewed and showed WBC 5.6 HB 12.3 plts 172,000.  Chemistries WNL with K+ 4.6 Cr 0.79 and normal LFTs.  Ferritin WNL at 92.  CEA pending.    Pt will RTC to go over scans and should follow-up with GI as previously recommended.    2.  RUL lung nodule is stable and dates back to 2017 and is felt likely benign.  Pt set up for CT chest for follow-up.    3.  ? [name].  In the past pt denies [name].  Previously recommended GYN as family member unsure when last pelvic exam.  Pt has not seen GYN.    4.  Constipation.  Pt previously referred to GI due to abnormal scan findings.    5.  Breast calcifications.  This was noted  on mammogram done 08/2018.  Caretaker reports pt is set up for breast biopsy through PCP.  Will confirm biopsy scheduled.    6.  Noncompliance.  Pt has been noncompliant with follow-up and referrals.  Caretaker informed of importance of keeping appointments..    25 minutes spent with more than 50% spent in review of records counseling and  coordination of care.    Interval History:  Historical data obtained from the note dated 09/22/2017.  63 yr old female previously followed by Dr. Whitney Muse for Stage IIC (T4bN0M0) adenocarcinoma of colon.  Pt was diagnosed in 01/2016; treated with right hemicolectomy with resection of terminal ileum; there was questionable liver lesion on imaging which was biopsied and negative for malignancy.  Post-surgery PET scan negative for residual or metastatic disease. There was notation of RUL pulmonary nodule that was stable and non-hypermetabolic on PET in 09/7251. CT chest imaging in 01/2017 revealed stable lung nodule with benign etiology being favored given stability x 1 year.   CEA elevated at 38.8 at time of diagnosis; post-operative CEA levels have normalized.  Most recent available CEA normal at 4.2. CEA is pending at this time. -She was due for colonoscopy with Dr. Gala Romney and in August 8018 but unfortunately this was canceled and never rescheduled.  CT scans from 6018 were negative for recurrent or metastatic disease. She is due for repeat CT scan in June 2019. Orders placed today. - Current Status:  Pt is seen today for follow-up.  She has only recently been seen by GI and is reportedly scheduled for colonoscopy 10/01/2018.  Pt denies any blood in stool or urine.     Adenocarcinoma of colon ([place])   01/25/2016 Imaging    CT C/A/P large 9 cm mass within the colon at level of hepatic flexure, enlarged mesenteric LN concerning for metastatic disease, There is a nonspecific 13 mm nodule within the right hemipelvis which may represent a metastatic deposit. Alternatively, given the appearance, this may represent a small amount of loculated fluid. There are nonspecific low-attenuation lesions within the liver, the majority which are subcentimeter in size. Metastatic disease is not excluded.     01/25/2016 Procedure    Colonoscopy on 6/22 with Large fungating, exophytic tumor appears to be arising out of the  base of cecum / appendix. It's partially obscuring the ileocecal valve.    01/29/2016 Surgery    R hemicolectomy with resection of terminal ileum, liver biopsy with Dr. Arnoldo Morale    01/29/2016 Pathology Results    INVASIVE WELL DIFFERENTIATED ADENOCARCINOMA WITH ABUNDANT EXTRACELLULAR MUCIN, SPANNING 19 CM IN GREATEST DIMENSION. - TUMOR INVADES THROUGH MUSCULARIS PROPRIA THROUGH THE SEROSA OF ONE BOWEL SEGMENT INTO A SECOND ADHESED SEGMENT OF BOWEL. - MARGINS ARE NEGATIVE.No extramural satellite tumor nodules seen, 0/27 LN, Liver biopsy with benign liver tissue    03/15/2016 PET scan    No abnormal metabolic activity status post interval right hemicolectomy. No evidence of residual or metastatic disease. The right upper lobe pulmonary nodule is stable and without hypermetabolic activity. This favors a benign etiology.      Problem List Patient Active Problem List   Diagnosis Date Noted  . Pulmonary hypertension, unspecified ([place]) [I27.20] 05/05/2017  . Colonic mass [K63.89]   . Adenocarcinoma of colon ([place]) [C18.9]   . HLD (hyperlipidemia) [E78.5] 01/22/2016  . GERD (gastroesophageal reflux disease) [K21.9] 01/22/2016  . Encounter for screening for cervical cancer  [Z12.4] 12/21/2015  . Iron deficiency anemia [D50.9] 09/18/2013  . Diabetes mellitus ([place]) [E11.9] 09/15/2013  .  Schizophrenia ([place]) [F20.9] 09/15/2013    Past Medical History Past Medical History:  Diagnosis Date  . Adenocarcinoma of colon ([place])   . Allergy   . Diabetes mellitus without complication ([place]) 6378  . GERD (gastroesophageal reflux disease)   . Iron deficiency anemia 09/18/2013  . Schizophrenia [facility])     Past Surgical History Past Surgical History:  Procedure Laterality Date  . BIOPSY  01/24/2016   Procedure: BIOPSY;  Surgeon: Daneil Dolin, MD;  Location: [facility] ENDO SUITE;  Service: Endoscopy;;  cecal mass  . COLONOSCOPY  04/21/2006   SLF: limited colonoscopy due to a poor bowel prep   . COLONOSCOPY   02/22/2009   SLF: poor bowel prep/Formed stools in the right colon which limited the extent of the exam/The scope was passed to approximately the proximal transverse colon.  No polyps, masses, inflammatory changes, diverticular/normal rectum  . COLONOSCOPY WITH PROPOFOL N/A 01/24/2016   Dr. Gala Romney: large fungating, exophytic tumor arising out of the base of the cecum/appendix. Partially obscuring the ileocecal valve  . EXCISIONAL HEMORRHOIDECTOMY    . LIVER BIOPSY N/A 01/29/2016   Procedure: LIVER BIOPSY;  Surgeon: Aviva Signs, MD;  Location: [facility] ORS;  Service: General;  Laterality: N/A;  . PARTIAL COLECTOMY N/A 01/29/2016   Procedure: PARTIAL COLECTOMY ;  Surgeon: Aviva Signs, MD;  Location: [facility] ORS;  Service: General;  Laterality: N/A;  . PORTACATH PLACEMENT Left 01/29/2016   Procedure: INSERTION PORT-A-CATH;  Surgeon: Aviva Signs, MD;  Location: [facility] ORS;  Service: General;  Laterality: Left;  left subclavian    Family History Family History  Problem Relation Age of Onset  . Leukemia Paternal Grandfather   . Leukemia Cousin   . Colon cancer Neg Hx      Social History  reports that she has been smoking cigarettes. She has a 9.50 pack-year smoking history. She has never used smokeless tobacco. She reports that she does not drink alcohol or use drugs.  Medications  Current Outpatient Medications:  .  benztropine (COGENTIN) 2 MG tablet, Take 1 mg by mouth 2 (two) times daily. , Disp: , Rfl:  .  clonazePAM (KLONOPIN) 0.5 MG tablet, Take 0.5 mg by mouth 2 (two) times daily. , Disp: , Rfl:  .  divalproex (DEPAKOTE ER) 250 MG 24 hr tablet, Take 250 mg by mouth at bedtime., Disp: , Rfl:  .  docusate sodium (COLACE) 100 MG capsule, Take 100 mg by mouth daily as needed for mild constipation., Disp: , Rfl:  .  famotidine (PEPCID) 20 MG tablet, Take 20 mg by mouth daily., Disp: , Rfl:  .  ferrous sulfate 325 (65 FE) MG tablet, Take 325 mg by mouth 2 (two) times daily with a meal., Disp: , Rfl:  .   fluPHENAZine (PROLIXIN) 5 MG tablet, Take 5 mg by mouth 2 (two) times daily., Disp: , Rfl:  .  loratadine (CLARITIN) 10 MG tablet, Take 10 mg by mouth daily., Disp: , Rfl:  .  metFORMIN (GLUCOPHAGE) 500 MG tablet, Take 500 mg by mouth 2 (two) times daily with a meal. , Disp: , Rfl:  .  NON FORMULARY, Take 1 capsule by mouth 2 (two) times daily. Fiber capsules 520 mg., Disp: , Rfl:  .  simvastatin (ZOCOR) 20 MG tablet, Take 20 mg by mouth daily., Disp: , Rfl:   Allergies Patient has no known allergies.  Review of Systems Review of Systems - Oncology ROS negative other than constipation   Physical Exam  Vitals Wt Readings from  Last 3 Encounters:  09/16/18 166 lb 3.2 oz (75.4 kg)  09/03/18 169 lb (76.7 kg)  02/19/18 169 lb 8 oz (76.9 kg)   Temp Readings from Last 3 Encounters:  09/16/18 97.7 F (36.5 C) (Oral)  09/09/18 97.8 F (36.6 C) (Oral)  09/03/18 (!) 97.1 F (36.2 C) (Oral)   BP Readings from Last 3 Encounters:  09/16/18 110/80  09/09/18 (!) 115/58  09/03/18 (!) 102/59   Pulse Readings from Last 3 Encounters:  09/16/18 83  09/09/18 (!) 51  09/03/18 (!) 50   Constitutional: Well-developed, well-nourished, and in no distress.   HENT: Head: Normocephalic and atraumatic.  Mouth/Throat: No oropharyngeal exudate. Mucosa moist. Eyes: Pupils are equal, round, and reactive to light. Conjunctivae are normal. No scleral icterus.  Neck: Normal range of motion. Neck supple. No JVD present.  Cardiovascular: Normal rate, regular rhythm and normal heart sounds.  Exam reveals no gallop and no friction rub.   No murmur heard. Pulmonary/Chest: Effort normal and breath sounds normal. No respiratory distress. No wheezes.No rales.  Abdominal: Soft. Bowel sounds are normal. No distension. There is no tenderness. There is no guarding.  Musculoskeletal: No edema or tenderness.  Lymphadenopathy: No cervical, axillary or supraclavicular adenopathy.  Neurological: Alert and oriented to  person, place, and time. No cranial nerve deficit.  Skin: Skin is warm and dry. No rash noted. No erythema. No pallor.  Psychiatric: Caretaker present for instructions.  Pt alert.    Labs Appointment on 09/16/2018  Component Date Value Ref Range Status  . WBC 09/16/2018 5.6  4.0 - 10.5 K/uL Final  . RBC 09/16/2018 5.04  3.87 - 5.11 MIL/uL Final  . Hemoglobin 09/16/2018 12.3  12.0 - 15.0 g/dL Final  . HCT 09/16/2018 40.9  36.0 - 46.0 % Final  . MCV 09/16/2018 81.2  80.0 - 100.0 fL Final  . MCH 09/16/2018 24.4* 26.0 - 34.0 pg Final  . MCHC 09/16/2018 30.1  30.0 - 36.0 g/dL Final  . RDW 09/16/2018 15.8* 11.5 - 15.5 % Final  . Platelets 09/16/2018 172  150 - 400 K/uL Final  . nRBC 09/16/2018 0.0  0.0 - 0.2 % Final  . Neutrophils Relative % 09/16/2018 56  % Final  . Neutro Abs 09/16/2018 3.1  1.7 - 7.7 K/uL Final  . Lymphocytes Relative 09/16/2018 33  % Final  . Lymphs Abs 09/16/2018 1.8  0.7 - 4.0 K/uL Final  . Monocytes Relative 09/16/2018 9  % Final  . Monocytes Absolute 09/16/2018 0.5  0.1 - 1.0 K/uL Final  . Eosinophils Relative 09/16/2018 2  % Final  . Eosinophils Absolute 09/16/2018 0.1  0.0 - 0.5 K/uL Final  . Basophils Relative 09/16/2018 0  % Final  . Basophils Absolute 09/16/2018 0.0  0.0 - 0.1 K/uL Final  . Immature Granulocytes 09/16/2018 0  % Final  . Abs Immature Granulocytes 09/16/2018 0.01  0.00 - 0.07 K/uL Final   Performed at [facility], [address], [place], [place] [address]  . Sodium 09/16/2018 139  135 - 145 mmol/L Final  . Potassium 09/16/2018 4.6  3.5 - 5.1 mmol/L Final  . Chloride 09/16/2018 107  98 - 111 mmol/L Final  . CO2 09/16/2018 22  22 - 32 mmol/L Final  . Glucose, Bld 09/16/2018 150* 70 - 99 mg/dL Final  . BUN 09/16/2018 17  8 - 23 mg/dL Final  . Creatinine, Ser 09/16/2018 0.79  0.44 - 1.00 mg/dL Final  . Calcium 09/16/2018 9.9  8.9 - 10.3 mg/dL Final  .  Total Protein 09/16/2018 8.0  6.5 - 8.1 g/dL Final  . Albumin 09/16/2018 3.9  3.5 - 5.0 g/dL  Final  . AST 09/16/2018 24  15 - 41 U/L Final  . ALT 09/16/2018 16  0 - 44 U/L Final  . Alkaline Phosphatase 09/16/2018 51  38 - 126 U/L Final  . Total Bilirubin 09/16/2018 0.4  0.3 - 1.2 mg/dL Final  . GFR calc non Af Amer 09/16/2018 >60  >60 mL/min Final  . GFR calc Af Amer 09/16/2018 >60  >60 mL/min Final  . Anion gap 09/16/2018 10  5 - 15 Final   Performed at [facility], [address], [place], [place] [address]  . LDH 09/16/2018 119  98 - 192 U/L Final   Performed at [facility], [address] [place], [place] [address]  . Ferritin 09/16/2018 92  11 - 307 ng/mL Final   Performed at [facility], [address], [place],  [address]     Pathology Orders Placed This Encounter  Procedures  . CT CHEST W CONTRAST    Standing Status:   Future    Standing Expiration Date:   09/16/2019    Order Specific Question:   If indicated for the ordered procedure, I authorize the administration of contrast media per Radiology protocol    Answer:   Yes    Order Specific Question:   Preferred imaging location?    Answer:   [facility]    Order Specific Question:   Radiology Contrast Protocol - do NOT remove file path    Answer:   \\[email or web address]  . CT ABDOMEN PELVIS W CONTRAST    Standing Status:   Future    Standing Expiration Date:   09/16/2019    Order Specific Question:   If indicated for the ordered procedure, I authorize the administration of contrast media per Radiology protocol    Answer:   Yes    Order Specific Question:   Preferred imaging location?    Answer:   [facility]    Order Specific Question:   Is Oral Contrast requested for this exam?    Answer:   Yes, Per Radiology protocol    Order Specific Question:   Radiology Contrast Protocol - do NOT remove file path    Answer:   \\[email or web address]  . CBC with Differential/Platelet    Standing Status:   Future    Number of Occurrences:   1    Standing  Expiration Date:   09/17/2019  . Comprehensive metabolic panel    Standing Status:   Future    Number of Occurrences:   1    Standing Expiration Date:   09/17/2019  . Lactate dehydrogenase    Standing Status:   Future    Number of Occurrences:   1    Standing Expiration Date:   09/17/2019  . Ferritin    Standing Status:   Future    Number of Occurrences:   1    Standing Expiration Date:   09/17/2019  . CEA    Standing Status:   Future    Number of Occurrences:   1    Standing Expiration Date:   09/17/2019       Zoila Shutter MD

## 2018-09-16 NOTE — Patient Instructions (Signed)
[facility] at [facility] Discharge Instructions  You were seen by Dr. Higgs today   Thank you for choosing  Cancer Center at [facility] to provide your oncology and hematology care.  To afford each patient quality time with our provider, please arrive at least 15 minutes before your scheduled appointment time.   If you have a lab appointment with the Cancer Center please come in thru the  Main Entrance and check in at the main information desk  You need to re-schedule your appointment should you arrive 10 or more minutes late.  We strive to give you quality time with our providers, and arriving late affects you and other patients whose appointments are after yours.  Also, if you no show three or more times for appointments you may be dismissed from the clinic at the providers discretion.     Again, thank you for choosing [facility].  Our hope is that these requests will decrease the amount of time that you wait before being seen by our physicians.       _____________________________________________________________  Should you have questions after your visit to [facility], please contact our office at [phone] between the hours of 8:00 a.m. and 4:30 p.m.  Voicemails left after 4:00 p.m. will not be returned until the following business day.  For prescription refill requests, have your pharmacy contact our office and allow 72 hours.    Cancer Center Support Programs:   > Cancer Support Group  2nd Tuesday of the month 1pm-2pm, Journey Room   

## 2018-09-17 LAB — CEA: CEA: 5.2 ng/mL — ABNORMAL HIGH (ref 0.0–4.7)

## 2018-09-18 ENCOUNTER — Other Ambulatory Visit: Payer: Self-pay | Admitting: Internal Medicine

## 2018-09-18 DIAGNOSIS — R921 Mammographic calcification found on diagnostic imaging of breast: Secondary | ICD-10-CM

## 2018-09-23 ENCOUNTER — Ambulatory Visit
Admission: RE | Admit: 2018-09-23 | Discharge: 2018-09-23 | Disposition: A | Discharge status: Home / Self Care | Payer: Medicaid Other | Source: Ambulatory Visit | Attending: Internal Medicine | Admitting: Internal Medicine

## 2018-09-23 DIAGNOSIS — R921 Mammographic calcification found on diagnostic imaging of breast: Secondary | ICD-10-CM

## 2018-09-24 ENCOUNTER — Encounter (HOSPITAL_COMMUNITY): Payer: Self-pay

## 2018-09-24 ENCOUNTER — Ambulatory Visit (HOSPITAL_COMMUNITY)
Admission: RE | Admit: 2018-09-24 | Discharge: 2018-09-24 | Disposition: A | Discharge status: Home / Self Care | Payer: Medicaid Other | Source: Ambulatory Visit | Attending: Internal Medicine | Admitting: Internal Medicine

## 2018-09-24 DIAGNOSIS — R933 Abnormal findings on diagnostic imaging of other parts of digestive tract: Secondary | ICD-10-CM | POA: Diagnosis not present

## 2018-09-24 MED ORDER — HEPARIN SOD (PORK) LOCK FLUSH 100 UNIT/ML IV SOLN
INTRAVENOUS | Status: AC
  Administered 2018-09-24: 500 [IU] via INTRAVENOUS
  Filled 2018-09-24: qty 5

## 2018-09-24 MED ORDER — SODIUM CHLORIDE (PF) 0.9 % IJ SOLN
INTRAMUSCULAR | Status: AC
  Filled 2018-09-24: qty 50

## 2018-09-24 MED ORDER — HEPARIN SOD (PORK) LOCK FLUSH 100 UNIT/ML IV SOLN
500.0000 [IU] | Freq: Once | INTRAVENOUS | Status: AC
  Administered 2018-09-24: 500 [IU] via INTRAVENOUS

## 2018-09-24 MED ORDER — IOHEXOL 300 MG/ML  SOLN
100.0000 mL | Freq: Once | INTRAMUSCULAR | Status: AC | PRN
  Administered 2018-09-24: 100 mL via INTRAVENOUS

## 2018-09-28 NOTE — Telephone Encounter (Signed)
Called bed placement and spoke with Dawn. She advised me to inform the patient not to come to the hospital on Wednesday until she calls them. She doesn't want the patient waiting with no bed.  I called facility and made aware.

## 2018-09-28 NOTE — Telephone Encounter (Signed)
Mary Wells:  Patient is scheduled for colonoscopy with Propofol on 10/01/2018 with Dr. Gala Romney. She will be admitted the day prior, 2/26. History significant for stage IIc adenocarcinoma of colon and IDA. Diagnosed in 2017. Colonoscopy on June 21, 2017showed large fungating, exophytic tumor arising out of the base of the cecum/appendix. Partially obscuringthe ileocecal valve. She had a right hemicolectomy with resection of the terminal ileum, liver biopsy by Dr. Arnoldo Morale on 01/29/2016. Liver biopsy was benign. She has not had a surveillance colonoscopy.   She needs inpatient prep due to history of eating snacks in middle of night, unable to prep well as outpatient. Known history of intermittent constipation. Patient is a limited historian but DOES sign her own consents.   She is starting clear liquids 2 days prior to colonoscopy.  Day of admission, 2/26: recommending 4 liters golytely immediately. May need additional 2 liters later that evening or following morning. Her procedure is in afternoon.  [facility] clinical pool:  I reached out to both Dr. Roderic Palau and Dr. Manuella Ghazi, who are hospitalists for that day. We can use Dr. Trena Platt name just to get things started, but when she arrives to admissions, the flow manager needs to be notified so the next in line hospitalist taking admissions may be notified for that actual day.

## 2018-09-29 ENCOUNTER — Inpatient Hospital Stay (HOSPITAL_BASED_OUTPATIENT_CLINIC_OR_DEPARTMENT_OTHER): Payer: Medicaid Other | Admitting: Internal Medicine

## 2018-09-29 ENCOUNTER — Encounter (HOSPITAL_COMMUNITY): Payer: Self-pay | Admitting: Internal Medicine

## 2018-09-29 ENCOUNTER — Encounter (HOSPITAL_COMMUNITY): Payer: Self-pay | Admitting: Lab

## 2018-09-29 ENCOUNTER — Other Ambulatory Visit: Payer: Self-pay

## 2018-09-29 VITALS — BP 114/53 | HR 68 | Temp 97.7°F | Resp 12 | Wt 167.5 lb

## 2018-09-29 DIAGNOSIS — C182 Malignant neoplasm of ascending colon: Secondary | ICD-10-CM

## 2018-09-29 DIAGNOSIS — R918 Other nonspecific abnormal finding of lung field: Secondary | ICD-10-CM | POA: Diagnosis not present

## 2018-09-29 DIAGNOSIS — F209 Schizophrenia, unspecified: Secondary | ICD-10-CM

## 2018-09-29 DIAGNOSIS — R350 Frequency of micturition: Secondary | ICD-10-CM | POA: Diagnosis not present

## 2018-09-29 DIAGNOSIS — E119 Type 2 diabetes mellitus without complications: Secondary | ICD-10-CM

## 2018-09-29 DIAGNOSIS — C189 Malignant neoplasm of colon, unspecified: Secondary | ICD-10-CM

## 2018-09-29 NOTE — Telephone Encounter (Addendum)
RMR had cancellation on 10/01/2018 and patient procedure time has now moved up to 10:30am. fyi to AB

## 2018-09-29 NOTE — Patient Instructions (Signed)
[facility]  Discharge Instructions: You saw Dr. Higgs today                               _______________________________________________________________  Thank you for choosing [facility] at [facility] to provide your oncology and hematology care.  To afford each patient quality time with our providers, please arrive at least 15 minutes before your scheduled appointment.  You need to re-schedule your appointment if you arrive 10 or more minutes late.  We strive to give you quality time with our providers, and arriving late affects you and other patients whose appointments are after yours.  Also, if you no show three or more times for appointments you may be dismissed from the clinic.  Again, thank you for choosing [facility]. Our hope is that these requests will allow you access to exceptional care and in a timely manner. _______________________________________________________________  If you have questions after your visit, please contact our office at [phone] between the hours of 8:30 a.m. and 5:00 p.m. Voicemails left after 4:30 p.m. will not be returned until the following business day. _______________________________________________________________  For prescription refill requests, have your pharmacy contact our office. _______________________________________________________________  Recommendations made by the consultant and any test results will be sent to your referring physician. _______________________________________________________________ 

## 2018-09-29 NOTE — Progress Notes (Signed)
Diagnosis Adenocarcinoma of colon ([place]) - Plan: CBC with Differential, Comprehensive metabolic panel, Lactate dehydrogenase, CEA  Staging Cancer Staging Adenocarcinoma of colon [facility]) Staging form: Colon and Rectum, AJCC 7th Edition - Pathologic stage from 02/02/2016: Stage IIC (T4b, N0, cM0) - Signed by Baird Cancer, PA-C on 07/04/2016   Assessment and Plan:  1.  Stage IIC (T4bN0M0) adenocarcinoma of colon.  Pt was diagnosed in 01/2016; treated with right hemicolectomy with resection of terminal ileum; there was questionable liver lesion on imaging which was biopsied and negative for malignancy.  Post-surgery PET scan negative for residual or metastatic disease. There was notation of RUL pulmonary nodule that was stable and non-hypermetabolic on PET in 12/4096. CT chest imaging in 01/2017 revealed stable lung nodule with benign etiology being favored given stability x 1 year.   Pt had CT scans from 01/2017 that were negative for recurrent or metastatic disease.   CT CAP that was done 02/12/2018 showed IMPRESSION:   CT Chest:  1.  No acute cardiopulmonary disease. 2. Stable 9 mm nodule in the POSTERIOR RIGHT UPPER LOBE dating back to August, 2017. Stability and lack of metabolic activity on the prior PET-CT indicates benignity. 3. No evidence of metastatic disease in the thorax. 4. Stable cylindrical bronchiectasis in the lower lobes, LEFT greater than RIGHT. 5. Stable mild atherosclerosis involving the LEFT main, LEFT circumflex and LAD coronary arteries.  CT Abdomen Pelvis:  1. Possible mass involving the mid rectum. While this may be due to the fact this segment is collapsed, a mass cannot be excluded based on its CT appearance. Please correlate with proctoscopy/sigmoidoscopy. 2. No evidence of metastatic disease involving the abdomen or pelvis. 3. Very large colonic stool burden which may indicate chronic constipation. 4. Cholelithiasis without evidence of acute  cholecystitis. 5. Hepatic steatosis. 6. 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 7. Possible [name] fragment in the endometrial canal of the LOWER uterine segment and gas in the endometrial canal above the device, Stable.  Pt was referred to GI and had multiple appointments and failed to keep follow-up.  She was recently been seen by GI in 08/2018 and is set up for colonoscopy reportedly this week.    CT done 09/24/2018 reviewed and showed   IMPRESSION: 1. Stable postsurgical changes from right hemicolectomy with no evidence of anastomotic tumor recurrence. 2. No findings of metastatic disease. 3. Low lying [name] in the uterine cavity, unchanged. Suggest correlation with pelvic ultrasound. 4. Questionable mild diffuse bladder wall thickening, which may be due to under distention. Urinalysis correlation may be obtained as clinically warranted. 5.  Aortic Atherosclerosis (ICD10-I70.0).  Pt is advised to keep colonoscopy evaluation due to [place] history and prior scan findings.    Labs done 09/16/2018 reviewed and showed WBC 5.6 HB 12.3 plts 172,000.  Chemistries WNL with K+ 4.6 Cr 0.79 and normal LFTs.  Ferritin WNL at 92.  CEA slightly elevated at 5.2  Will repeat labs 03/2019.    2.  RUL lung nodule is stable and dates back to 2017 and is felt likely benign.  CT chest done 09/24/2018 shows stable findings with 1 cm RUL lung nodule that is stable.   3.  ? [name].  In the past pt denies [name].  Previously recommended GYN as family member unsure when last pelvic exam.  Pt has not seen GYN.  Pt is again referred to GYN for evaluation.    4.  Increased urinary frequency.   Pt unable to give  urine sample today.  CT showed questionable bladder thickening.  Will refer to urology for evaluation due to symptoms.   5.  Breast calcifications.  This was noted on mammogram done 08/2018.  Pt had right breast biopsy done 09/23/2018 that showed fibrocystic changes.  Follow-up  imaging per radiology likely 6 months to 1 year.    6.  Noncompliance.  Pt has been noncompliant with follow-up and referrals.  Caretaker informed of importance of keeping appointments..    25 minutes spent with more than 50% spent in review of records counseling and coordination of care.    Interval History:  Historical data obtained from the note dated 09/22/2017.  63 yr old female previously followed by Dr. Whitney Muse for Stage IIC (T4bN0M0) adenocarcinoma of colon.  Pt was diagnosed in 01/2016; treated with right hemicolectomy with resection of terminal ileum; there was questionable liver lesion on imaging which was biopsied and negative for malignancy.  Post-surgery PET scan negative for residual or metastatic disease. There was notation of RUL pulmonary nodule that was stable and non-hypermetabolic on PET in 0/2409. CT chest imaging in 01/2017 revealed stable lung nodule with benign etiology being favored given stability x 1 year.   CEA elevated at 38.8 at time of diagnosis; post-operative CEA levels have normalized.  Most recent available CEA normal at 4.2. CEA is pending at this time. -She was due for colonoscopy with Dr. Gala Romney and in August 8018 but unfortunately this was canceled and never rescheduled.  CT scans from 6018 were negative for recurrent or metastatic disease. She is due for repeat CT scan in June 2019. Orders placed today.  Current Status:  Pt is seen today for follow-up.  She reports she is scheduled for colonoscopy this week.  She is here to go over CT scans.  She reports increased urinary frequency.     Adenocarcinoma of colon ([place])   01/25/2016 Imaging    CT C/A/P large 9 cm mass within the colon at level of hepatic flexure, enlarged mesenteric LN concerning for metastatic disease, There is a nonspecific 13 mm nodule within the right hemipelvis which may represent a metastatic deposit. Alternatively, given the appearance, this may represent a small amount of loculated  fluid. There are nonspecific low-attenuation lesions within the liver, the majority which are subcentimeter in size. Metastatic disease is not excluded.     01/25/2016 Procedure    Colonoscopy on 6/22 with Large fungating, exophytic tumor appears to be arising out of the base of cecum / appendix. It's partially obscuring the ileocecal valve.    01/29/2016 Surgery    R hemicolectomy with resection of terminal ileum, liver biopsy with Dr. Arnoldo Morale    01/29/2016 Pathology Results    INVASIVE WELL DIFFERENTIATED ADENOCARCINOMA WITH ABUNDANT EXTRACELLULAR MUCIN, SPANNING 19 CM IN GREATEST DIMENSION. - TUMOR INVADES THROUGH MUSCULARIS PROPRIA THROUGH THE SEROSA OF ONE BOWEL SEGMENT INTO A SECOND ADHESED SEGMENT OF BOWEL. - MARGINS ARE NEGATIVE.No extramural satellite tumor nodules seen, 0/27 LN, Liver biopsy with benign liver tissue    03/15/2016 PET scan    No abnormal metabolic activity status post interval right hemicolectomy. No evidence of residual or metastatic disease. The right upper lobe pulmonary nodule is stable and without hypermetabolic activity. This favors a benign etiology.      Problem List Patient Active Problem List   Diagnosis Date Noted  . Pulmonary hypertension, unspecified ([place]) [I27.20] 05/05/2017  . Colonic mass [K63.89]   . Adenocarcinoma of colon ([place]) [C18.9]   . HLD (hyperlipidemia) [  E78.5] 01/22/2016  . GERD (gastroesophageal reflux disease) [K21.9] 01/22/2016  . Encounter for screening for cervical cancer  [Z12.4] 12/21/2015  . Iron deficiency anemia [D50.9] 09/18/2013  . Diabetes mellitus ([place]) [E11.9] 09/15/2013  . Schizophrenia ([place]) [F20.9] 09/15/2013    Past Medical History Past Medical History:  Diagnosis Date  . Adenocarcinoma of colon ([place])   . Allergy   . Diabetes mellitus without complication ([place]) 6283  . GERD (gastroesophageal reflux disease)   . Iron deficiency anemia 09/18/2013  . Schizophrenia [facility])     Past Surgical History Past  Surgical History:  Procedure Laterality Date  . BIOPSY  01/24/2016   Procedure: BIOPSY;  Surgeon: Daneil Dolin, MD;  Location: [facility];  Service: Endoscopy;;  cecal mass  . COLONOSCOPY  04/21/2006   SLF: limited colonoscopy due to a poor bowel prep   . COLONOSCOPY  02/22/2009   SLF: poor bowel prep/Formed stools in the right colon which limited the extent of the exam/The scope was passed to approximately the proximal transverse colon.  No polyps, masses, inflammatory changes, diverticular/normal rectum  . COLONOSCOPY WITH PROPOFOL N/A 01/24/2016   Dr. Gala Romney: large fungating, exophytic tumor arising out of the base of the cecum/appendix. Partially obscuring the ileocecal valve  . EXCISIONAL HEMORRHOIDECTOMY    . LIVER BIOPSY N/A 01/29/2016   Procedure: LIVER BIOPSY;  Surgeon: Aviva Signs, MD;  Location: [facility] ORS;  Service: General;  Laterality: N/A;  . PARTIAL COLECTOMY N/A 01/29/2016   Procedure: PARTIAL COLECTOMY ;  Surgeon: Aviva Signs, MD;  Location: [facility] ORS;  Service: General;  Laterality: N/A;  . PORTACATH PLACEMENT Left 01/29/2016   Procedure: INSERTION PORT-A-CATH;  Surgeon: Aviva Signs, MD;  Location: [facility] ORS;  Service: General;  Laterality: Left;  left subclavian    Family History Family History  Problem Relation Age of Onset  . Leukemia Paternal Grandfather   . Leukemia Cousin   . Colon cancer Neg Hx      Social History  reports that she has been smoking cigarettes. She has a 9.50 pack-year smoking history. She has never used smokeless tobacco. She reports that she does not drink alcohol or use drugs.  Medications  Current Outpatient Medications:  .  benztropine (COGENTIN) 1 MG tablet, Take 1 mg by mouth 2 (two) times daily., Disp: , Rfl:  .  clonazePAM (KLONOPIN) 0.5 MG tablet, Take 0.5 mg by mouth 2 (two) times daily. , Disp: , Rfl:  .  divalproex (DEPAKOTE ER) 250 MG 24 hr tablet, Take 250 mg by mouth at bedtime., Disp: , Rfl:  .  docusate sodium (COLACE) 100 MG  capsule, Take 100 mg by mouth daily as needed for mild constipation., Disp: , Rfl:  .  famotidine (PEPCID) 20 MG tablet, Take 20 mg by mouth daily., Disp: , Rfl:  .  ferrous sulfate 325 (65 FE) MG tablet, Take 325 mg by mouth 2 (two) times daily with a meal. Pt has colonoscopy on 2/26 and is currently holding iron tablet until after procedure, Disp: , Rfl:  .  fluPHENAZine (PROLIXIN) 5 MG tablet, Take 5 mg by mouth 2 (two) times daily., Disp: , Rfl:  .  loratadine (CLARITIN) 10 MG tablet, Take 10 mg by mouth daily., Disp: , Rfl:  .  metFORMIN (GLUCOPHAGE) 500 MG tablet, Take 500 mg by mouth 2 (two) times daily with a meal. , Disp: , Rfl:  .  NON FORMULARY, Take 1 capsule by mouth 2 (two) times daily. Fiber capsules 520 mg., Disp: ,  Rfl:  .  simvastatin (ZOCOR) 20 MG tablet, Take 20 mg by mouth daily., Disp: , Rfl:   Allergies Patient has no known allergies.  Review of Systems Review of Systems - Oncology ROS negative other than increased urinary frequency.    Physical Exam  Vitals Wt Readings from Last 3 Encounters:  09/29/18 167 lb 8 oz (76 kg)  09/16/18 166 lb 3.2 oz (75.4 kg)  09/03/18 169 lb (76.7 kg)   Temp Readings from Last 3 Encounters:  09/29/18 97.7 F (36.5 C) (Oral)  09/16/18 97.7 F (36.5 C) (Oral)  09/09/18 97.8 F (36.6 C) (Oral)   BP Readings from Last 3 Encounters:  09/29/18 (!) 114/53  09/16/18 110/80  09/09/18 (!) 115/58   Pulse Readings from Last 3 Encounters:  09/29/18 68  09/16/18 83  09/09/18 (!) 51   Constitutional: Well-developed, well-nourished, and in no distress.   HENT: Head: Normocephalic and atraumatic.  Mouth/Throat: No oropharyngeal exudate. Mucosa moist. Eyes: Pupils are equal, round, and reactive to light. Conjunctivae are normal. No scleral icterus.  Neck: Normal range of motion. Neck supple. No JVD present.  Cardiovascular: Normal rate, regular rhythm and normal heart sounds.  Exam reveals no gallop and no friction rub.   No murmur  heard. Pulmonary/Chest: Effort normal and breath sounds normal. No respiratory distress. No wheezes.No rales.  Abdominal: Soft. Bowel sounds are normal. No distension. There is no tenderness. There is no guarding.  Musculoskeletal: No edema or tenderness.  Lymphadenopathy: No cervical, axillary or supraclavicular adenopathy.  Neurological: Alert and oriented to person, place, and time. No cranial nerve deficit.  Skin: Skin is warm and dry. No rash noted. No erythema. No pallor.  Psychiatric: Affect and judgment normal.   Labs No visits with results within 3 Day(s) from this visit.  Latest known visit with results is:  Appointment on 09/16/2018  Component Date Value Ref Range Status  . WBC 09/16/2018 5.6  4.0 - 10.5 K/uL Final  . RBC 09/16/2018 5.04  3.87 - 5.11 MIL/uL Final  . Hemoglobin 09/16/2018 12.3  12.0 - 15.0 g/dL Final  . HCT 09/16/2018 40.9  36.0 - 46.0 % Final  . MCV 09/16/2018 81.2  80.0 - 100.0 fL Final  . MCH 09/16/2018 24.4* 26.0 - 34.0 pg Final  . MCHC 09/16/2018 30.1  30.0 - 36.0 g/dL Final  . RDW 09/16/2018 15.8* 11.5 - 15.5 % Final  . Platelets 09/16/2018 172  150 - 400 K/uL Final  . nRBC 09/16/2018 0.0  0.0 - 0.2 % Final  . Neutrophils Relative % 09/16/2018 56  % Final  . Neutro Abs 09/16/2018 3.1  1.7 - 7.7 K/uL Final  . Lymphocytes Relative 09/16/2018 33  % Final  . Lymphs Abs 09/16/2018 1.8  0.7 - 4.0 K/uL Final  . Monocytes Relative 09/16/2018 9  % Final  . Monocytes Absolute 09/16/2018 0.5  0.1 - 1.0 K/uL Final  . Eosinophils Relative 09/16/2018 2  % Final  . Eosinophils Absolute 09/16/2018 0.1  0.0 - 0.5 K/uL Final  . Basophils Relative 09/16/2018 0  % Final  . Basophils Absolute 09/16/2018 0.0  0.0 - 0.1 K/uL Final  . Immature Granulocytes 09/16/2018 0  % Final  . Abs Immature Granulocytes 09/16/2018 0.01  0.00 - 0.07 K/uL Final   Performed at [facility], [address], [place], [place] [address]  . Sodium 09/16/2018 139  135 - 145 mmol/L Final  .  Potassium 09/16/2018 4.6  3.5 - 5.1 mmol/L Final  .  Chloride 09/16/2018 107  98 - 111 mmol/L Final  . CO2 09/16/2018 22  22 - 32 mmol/L Final  . Glucose, Bld 09/16/2018 150* 70 - 99 mg/dL Final  . BUN 09/16/2018 17  8 - 23 mg/dL Final  . Creatinine, Ser 09/16/2018 0.79  0.44 - 1.00 mg/dL Final  . Calcium 09/16/2018 9.9  8.9 - 10.3 mg/dL Final  . Total Protein 09/16/2018 8.0  6.5 - 8.1 g/dL Final  . Albumin 09/16/2018 3.9  3.5 - 5.0 g/dL Final  . AST 09/16/2018 24  15 - 41 U/L Final  . ALT 09/16/2018 16  0 - 44 U/L Final  . Alkaline Phosphatase 09/16/2018 51  38 - 126 U/L Final  . Total Bilirubin 09/16/2018 0.4  0.3 - 1.2 mg/dL Final  . GFR calc non Af Amer 09/16/2018 >60  >60 mL/min Final  . GFR calc Af Amer 09/16/2018 >60  >60 mL/min Final  . Anion gap 09/16/2018 10  5 - 15 Final   Performed at [facility], [address], [place], [place] [address]  . LDH 09/16/2018 119  98 - 192 U/L Final   Performed at [facility], [address] [place], [place] [address]  . Ferritin 09/16/2018 92  11 - 307 ng/mL Final   Performed at [facility], [address] [place], [place] [address]  . CEA 09/16/2018 5.2* 0.0 - 4.7 ng/mL Final   Comment: (NOTE)                             Nonsmokers          <3.9                             Smokers             <5.6 [employer] Electrochemiluminescence Immunoassay (ECLIA) Values obtained with different assay methods or kits cannot be used interchangeably.  Results cannot be interpreted as absolute evidence of the presence or absence of malignant disease. Performed At: [facility] [place], [place] [id number] Rush Farmer MD [id number]      Pathology Orders Placed This Encounter  Procedures  . CBC with Differential    Standing Status:   Future    Standing Expiration Date:   09/30/2019  . Comprehensive metabolic panel    Standing Status:   Future    Standing Expiration Date:   09/30/2019  . Lactate dehydrogenase     Standing Status:   Future    Standing Expiration Date:   09/30/2019  . CEA    Standing Status:   Future    Standing Expiration Date:   09/30/2019       Mary Shutter MD

## 2018-09-29 NOTE — Progress Notes (Unsigned)
Referral sent to [facility].  Records faxed on 2/26

## 2018-09-30 ENCOUNTER — Observation Stay (HOSPITAL_COMMUNITY)
Admission: RE | Admit: 2018-09-30 | Discharge: 2018-10-01 | Payer: Medicaid Other | Source: Ambulatory Visit | Attending: Internal Medicine | Admitting: Internal Medicine

## 2018-09-30 ENCOUNTER — Encounter (HOSPITAL_COMMUNITY): Payer: Self-pay

## 2018-09-30 ENCOUNTER — Ambulatory Visit (HOSPITAL_COMMUNITY): Payer: Medicaid Other | Admitting: Internal Medicine

## 2018-09-30 DIAGNOSIS — D508 Other iron deficiency anemias: Secondary | ICD-10-CM

## 2018-09-30 DIAGNOSIS — E785 Hyperlipidemia, unspecified: Secondary | ICD-10-CM | POA: Diagnosis not present

## 2018-09-30 DIAGNOSIS — R911 Solitary pulmonary nodule: Secondary | ICD-10-CM | POA: Insufficient documentation

## 2018-09-30 DIAGNOSIS — K5900 Constipation, unspecified: Secondary | ICD-10-CM | POA: Insufficient documentation

## 2018-09-30 DIAGNOSIS — Z08 Encounter for follow-up examination after completed treatment for malignant neoplasm: Principal | ICD-10-CM | POA: Insufficient documentation

## 2018-09-30 DIAGNOSIS — E782 Mixed hyperlipidemia: Secondary | ICD-10-CM

## 2018-09-30 DIAGNOSIS — F209 Schizophrenia, unspecified: Secondary | ICD-10-CM

## 2018-09-30 DIAGNOSIS — D649 Anemia, unspecified: Secondary | ICD-10-CM

## 2018-09-30 DIAGNOSIS — E119 Type 2 diabetes mellitus without complications: Secondary | ICD-10-CM | POA: Diagnosis not present

## 2018-09-30 DIAGNOSIS — F17210 Nicotine dependence, cigarettes, uncomplicated: Secondary | ICD-10-CM | POA: Diagnosis not present

## 2018-09-30 DIAGNOSIS — D509 Iron deficiency anemia, unspecified: Secondary | ICD-10-CM | POA: Diagnosis present

## 2018-09-30 DIAGNOSIS — Z7984 Long term (current) use of oral hypoglycemic drugs: Secondary | ICD-10-CM | POA: Diagnosis not present

## 2018-09-30 DIAGNOSIS — R933 Abnormal findings on diagnostic imaging of other parts of digestive tract: Secondary | ICD-10-CM | POA: Insufficient documentation

## 2018-09-30 DIAGNOSIS — K6289 Other specified diseases of anus and rectum: Secondary | ICD-10-CM | POA: Diagnosis not present

## 2018-09-30 DIAGNOSIS — K648 Other hemorrhoids: Secondary | ICD-10-CM | POA: Insufficient documentation

## 2018-09-30 DIAGNOSIS — Z9119 Patient's noncompliance with other medical treatment and regimen: Secondary | ICD-10-CM | POA: Insufficient documentation

## 2018-09-30 DIAGNOSIS — K644 Residual hemorrhoidal skin tags: Secondary | ICD-10-CM | POA: Diagnosis not present

## 2018-09-30 DIAGNOSIS — Z79899 Other long term (current) drug therapy: Secondary | ICD-10-CM | POA: Diagnosis not present

## 2018-09-30 DIAGNOSIS — Z85038 Personal history of other malignant neoplasm of large intestine: Secondary | ICD-10-CM | POA: Diagnosis present

## 2018-09-30 DIAGNOSIS — Z9049 Acquired absence of other specified parts of digestive tract: Secondary | ICD-10-CM | POA: Insufficient documentation

## 2018-09-30 DIAGNOSIS — C189 Malignant neoplasm of colon, unspecified: Secondary | ICD-10-CM | POA: Diagnosis not present

## 2018-09-30 LAB — BASIC METABOLIC PANEL
Anion gap: 9 (ref 5–15)
BUN: 9 mg/dL (ref 8–23)
CO2: 24 mmol/L (ref 22–32)
Calcium: 9.4 mg/dL (ref 8.9–10.3)
Chloride: 107 mmol/L (ref 98–111)
Creatinine, Ser: 0.78 mg/dL (ref 0.44–1.00)
GFR calc Af Amer: >60 mL/min (ref >60)
GFR calc non Af Amer: >60 mL/min (ref >60)
Glucose, Bld: 123 mg/dL — ABNORMAL HIGH (ref 70–99)
Potassium: 3.8 mmol/L (ref 3.5–5.1)
Sodium: 140 mmol/L (ref 135–145)

## 2018-09-30 LAB — GLUCOSE, CAPILLARY
Glucose-Capillary: 47 mg/dL — ABNORMAL LOW (ref 70–99)
Glucose-Capillary: 95 mg/dL (ref 70–99)
Glucose-Capillary: 82 mg/dL (ref 70–99)

## 2018-09-30 LAB — CBC
HCT: 42.8 % (ref 36.0–46.0)
Hemoglobin: 12.4 g/dL (ref 12.0–15.0)
MCH: 23.9 pg — ABNORMAL LOW (ref 26.0–34.0)
MCHC: 29 g/dL — ABNORMAL LOW (ref 30.0–36.0)
MCV: 82.5 fL (ref 80.0–100.0)
NRBC: 0 % (ref 0.0–0.2)
PLATELETS: 190 10*3/uL (ref 150–400)
RBC: 5.19 MIL/uL — ABNORMAL HIGH (ref 3.87–5.11)
RDW: 16 % — ABNORMAL HIGH (ref 11.5–15.5)
WBC: 4.6 10*3/uL (ref 4.0–10.5)

## 2018-09-30 LAB — HEMOGLOBIN A1C
Hgb A1c MFr Bld: 6.4 % — ABNORMAL HIGH (ref 4.8–5.6)
Mean Plasma Glucose: 136.98 mg/dL

## 2018-09-30 MED ORDER — PEG 3350-KCL-NA BICARB-NACL 420 G PO SOLR
4000.0000 mL | Freq: Once | ORAL | Status: AC
  Administered 2018-09-30: 4000 mL via ORAL

## 2018-09-30 MED ORDER — ACETAMINOPHEN 650 MG RE SUPP: 650.0000 mg | Freq: Four times a day (QID) | RECTAL | Status: DC | PRN

## 2018-09-30 MED ORDER — BENZTROPINE MESYLATE 1 MG PO TABS
1.0000 mg | ORAL_TABLET | Freq: Two times a day (BID) | ORAL | Status: DC
  Administered 2018-09-30 – 2018-10-01 (×2): 1 mg via ORAL
  Filled 2018-09-30 (×2): qty 1

## 2018-09-30 MED ORDER — ONDANSETRON HCL 4 MG PO TABS
4.0000 mg | ORAL_TABLET | Freq: Four times a day (QID) | ORAL | Status: DC | PRN
  Filled 2018-09-30: qty 1

## 2018-09-30 MED ORDER — LORATADINE 10 MG PO TABS
10.0000 mg | ORAL_TABLET | Freq: Every day | ORAL | Status: DC
  Administered 2018-10-01: 10 mg via ORAL
  Filled 2018-09-30: qty 1

## 2018-09-30 MED ORDER — POLYETHYLENE GLYCOL 3350 17 G PO PACK
17.0000 g | PACK | ORAL | Status: AC
  Administered 2018-09-30 (×4): 17 g via ORAL
  Filled 2018-09-30 (×2): qty 1

## 2018-09-30 MED ORDER — CLONAZEPAM 0.5 MG PO TABS
0.5000 mg | ORAL_TABLET | Freq: Two times a day (BID) | ORAL | Status: DC
  Administered 2018-09-30 – 2018-10-01 (×2): 0.5 mg via ORAL
  Filled 2018-09-30 (×2): qty 1

## 2018-09-30 MED ORDER — FLUPHENAZINE HCL 5 MG PO TABS
5.0000 mg | ORAL_TABLET | Freq: Two times a day (BID) | ORAL | Status: DC
  Administered 2018-09-30 – 2018-10-01 (×2): 5 mg via ORAL
  Filled 2018-09-30 (×5): qty 1

## 2018-09-30 MED ORDER — ENOXAPARIN SODIUM 40 MG/0.4ML ~~LOC~~ SOLN
40.0000 mg | SUBCUTANEOUS | Status: DC
  Administered 2018-09-30: 40 mg via SUBCUTANEOUS
  Filled 2018-09-30: qty 0.4

## 2018-09-30 MED ORDER — ACETAMINOPHEN 325 MG PO TABS: 650.0000 mg | ORAL_TABLET | Freq: Four times a day (QID) | ORAL | Status: DC | PRN

## 2018-09-30 MED ORDER — BISACODYL 5 MG PO TBEC
10.0000 mg | DELAYED_RELEASE_TABLET | Freq: Once | ORAL | Status: AC
  Administered 2018-09-30: 10 mg via ORAL
  Filled 2018-09-30: qty 2

## 2018-09-30 MED ORDER — METFORMIN HCL 500 MG PO TABS
500.0000 mg | ORAL_TABLET | Freq: Two times a day (BID) | ORAL | Status: DC
  Administered 2018-09-30: 500 mg via ORAL
  Filled 2018-09-30 (×2): qty 1

## 2018-09-30 MED ORDER — INSULIN ASPART 100 UNIT/ML ~~LOC~~ SOLN: 0.0000 [IU] | Freq: Three times a day (TID) | SUBCUTANEOUS | Status: DC

## 2018-09-30 MED ORDER — ONDANSETRON HCL 4 MG/2ML IJ SOLN: 4.0000 mg | Freq: Four times a day (QID) | INTRAMUSCULAR | Status: DC | PRN

## 2018-09-30 MED ORDER — DIVALPROEX SODIUM ER 250 MG PO TB24
250.0000 mg | ORAL_TABLET | Freq: Every day | ORAL | Status: DC
  Administered 2018-09-30: 250 mg via ORAL
  Filled 2018-09-30 (×2): qty 1

## 2018-09-30 MED ORDER — FAMOTIDINE 20 MG PO TABS
20.0000 mg | ORAL_TABLET | Freq: Every day | ORAL | Status: DC
  Administered 2018-09-30 – 2018-10-01 (×2): 20 mg via ORAL
  Filled 2018-09-30 (×2): qty 1

## 2018-09-30 MED ORDER — SIMVASTATIN 20 MG PO TABS
20.0000 mg | ORAL_TABLET | Freq: Every day | ORAL | Status: DC
  Administered 2018-09-30 – 2018-10-01 (×2): 20 mg via ORAL
  Filled 2018-09-30 (×2): qty 1

## 2018-09-30 NOTE — Progress Notes (Signed)
Hypoglycemic Event  CBG: 47  Treatment: 8 oz juice/soda  Symptoms: None   Possible Reasons for nt: Unknown  Comments: Patient alert and oriented, talking in bed with no complaints.    Venita Sheffield

## 2018-09-30 NOTE — Consult Note (Signed)
Referring Provider: Orson Eva, MD Primary Care Physician:  Rosita Fire, MD Primary Gastroenterologist:  Garfield Cornea, MD  Reason for Consultation:  H/o colon cancer, due surveillance colonoscopy. Abnormal rectum on 02/2018 CT  HPI: Mary Wells is a 63 y.o. female admitted to assist with bowel preparation for colonoscopy which is scheduled tomorrow.  He has a history of stage IIc adenocarcinoma the colon, IDA.  She was diagnosed in 2017.  Colonoscopy at that time showed large fungating, exophytic tumor arising out of the base of the cecum/appendix.  Partially obscuring the ileocecal valve.  She had a right hemicolectomy with resection of the TI, liver biopsy in June 2017 by Dr. Arnoldo Morale.  Liver biopsy was benign.  She has not had a surveillance colonoscopy due to multiple canceled or missed appointments.  She had a CT of the abdomen pelvis in July 2019 that showed possible mass involving the mid rectum but cannot exclude collapsed segment, very large colonic stool burden.  She resides in a group home, [facility].  She was a difficult bowel prep in the past because she did not stick to the clear liquid diet and staff previously said they were unable to make sure that she did not eat snacks during the middle of the night.  For these reasons she was brought in to assist with bowel preparation.  Patient had a CT chest abdomen pelvis last week which showed no evidence of metastatic disease.  Labs from February 2 showed hemoglobin of 12.3, hematocrit 40.9, platelets 172,000, BUN 17, creatinine 0.79, potassium 4.6, LFTs normal.  CEA slightly elevated at 5.2, ferritin 92.  Patient denies any significant complaints.  No abdominal pain.  Her last bowel movement was 2 days ago.  Reports that her stools are black ever since being on iron.  No rectal bleeding.  Appetite is good.  No nausea or vomiting.  No dysphagia.   Prior to Admission medications   Medication Sig Start Date End Date Taking?  Authorizing Provider  benztropine (COGENTIN) 1 MG tablet Take 1 mg by mouth 2 (two) times daily.    [provider]  clonazePAM (KLONOPIN) 0.5 MG tablet Take 0.5 mg by mouth 2 (two) times daily.     [provider]  divalproex (DEPAKOTE ER) 250 MG 24 hr tablet Take 250 mg by mouth at bedtime.    [provider]  docusate sodium (COLACE) 100 MG capsule Take 100 mg by mouth daily as needed for mild constipation.    [provider]  famotidine (PEPCID) 20 MG tablet Take 20 mg by mouth daily.    [provider]  ferrous sulfate 325 (65 FE) MG tablet Take 325 mg by mouth 2 (two) times daily with a meal. Pt has colonoscopy on 2/26 and is currently holding iron tablet until after procedure    [provider]  fluPHENAZine (PROLIXIN) 5 MG tablet Take 5 mg by mouth 2 (two) times daily.    [provider]  loratadine (CLARITIN) 10 MG tablet Take 10 mg by mouth daily.    [provider]  metFORMIN (GLUCOPHAGE) 500 MG tablet Take 500 mg by mouth 2 (two) times daily with a meal.     [provider]  NON FORMULARY Take 1 capsule by mouth 2 (two) times daily. Fiber capsules 520 mg.    [provider]  simvastatin (ZOCOR) 20 MG tablet Take 20 mg by mouth daily.    [provider]    Current Facility-Administered  Medications  Medication Dose Route Frequency Provider Last Rate Last Dose  . acetaminophen (TYLENOL) tablet 650 mg  650 mg Oral Q6H PRN Tat, Shanon Brow, MD       Or  . acetaminophen (TYLENOL) suppository 650 mg  650 mg Rectal Q6H PRN Tat, Shanon Brow, MD      . benztropine (COGENTIN) tablet 1 mg  1 mg Oral BID Tat, David, MD      . bisacodyl (DULCOLAX) EC tablet 10 mg  10 mg Oral Once Mahala Menghini, PA-C      . clonazePAM (KLONOPIN) tablet 0.5 mg  0.5 mg Oral BID Tat, David, MD      . divalproex (DEPAKOTE ER) 24 hr tablet 250 mg  250 mg Oral QHS Tat, Shanon Brow, MD      . enoxaparin (LOVENOX) injection 40 mg  40 mg  Subcutaneous Q24H Tat, David, MD      . famotidine (PEPCID) tablet 20 mg  20 mg Oral Daily Tat, David, MD      . fluPHENAZine (PROLIXIN) tablet 5 mg  5 mg Oral BID Tat, David, MD      . insulin aspart (novoLOG) injection 0-9 Units  0-9 Units Subcutaneous TID WC Tat, David, MD      . loratadine (CLARITIN) tablet 10 mg  10 mg Oral Daily Tat, David, MD      . metFORMIN (GLUCOPHAGE) tablet 500 mg  500 mg Oral BID WC Tat, David, MD      . ondansetron (ZOFRAN) tablet 4 mg  4 mg Oral Q6H PRN Tat, David, MD       Or  . ondansetron (ZOFRAN) injection 4 mg  4 mg Intravenous Q6H PRN Tat, David, MD      . simvastatin (ZOCOR) tablet 20 mg  20 mg Oral [id number] Orson Eva, MD        Allergies as of 09/28/2018  . (No Known Allergies)    Past Medical History:  Diagnosis Date  . Adenocarcinoma of colon ([place])   . Allergy   . Diabetes mellitus without complication ([place]) 8676  . GERD (gastroesophageal reflux disease)   . Iron deficiency anemia 09/18/2013  . Schizophrenia [facility])     Past Surgical History:  Procedure Laterality Date  . BIOPSY  01/24/2016   Procedure: BIOPSY;  Surgeon: Daneil Dolin, MD;  Location: [facility] ENDO SUITE;  Service: Endoscopy;;  cecal mass  . COLONOSCOPY  04/21/2006   [facility]: limited colonoscopy due to a poor bowel prep   . COLONOSCOPY  02/22/2009   [facility]: poor bowel prep/Formed stools in the right colon which limited the extent of the exam/The scope was passed to approximately the proximal transverse colon.  No polyps, masses, inflammatory changes, diverticular/normal rectum  . COLONOSCOPY WITH PROPOFOL N/A 01/24/2016   Dr. Gala Romney: large fungating, exophytic tumor arising out of the base of the cecum/appendix. Partially obscuring the ileocecal valve  . EXCISIONAL HEMORRHOIDECTOMY    . LIVER BIOPSY N/A 01/29/2016   Procedure: LIVER BIOPSY;  Surgeon: Aviva Signs, MD;  Location: [facility] ORS;  Service: General;  Laterality: N/A;  . PARTIAL COLECTOMY N/A 01/29/2016   Procedure: PARTIAL COLECTOMY ;   Surgeon: Aviva Signs, MD;  Location: [facility] ORS;  Service: General;  Laterality: N/A;  . PORTACATH PLACEMENT Left 01/29/2016   Procedure: INSERTION PORT-A-CATH;  Surgeon: Aviva Signs, MD;  Location: [facility] ORS;  Service: General;  Laterality: Left;  left subclavian    Family History  Problem Relation Age of Onset  . Leukemia Paternal Grandfather   .  Leukemia Cousin   . Colon cancer Neg Hx     Social History   Socioeconomic History  . Marital status: Single    Spouse name: Not on file  . Number of children: Not on file  . Years of education: Not on file  . Highest education level: Not on file  Occupational History  . Not on file  Social Needs  . Financial resource strain: Not on file  . Food insecurity:    Worry: Not on file    Inability: Not on file  . Transportation needs:    Medical: Not on file    Non-medical: Not on file  Tobacco Use  . Smoking status: Current Every Day Smoker    Packs/day: 0.50    Years: 19.00    Pack years: 9.50    Types: Cigarettes  . Smokeless tobacco: Never Used  . Tobacco comment: 3 cigs daily  Substance and Sexual Activity  . Alcohol use: No    Alcohol/week: 0.0 standard drinks  . Drug use: No  . Sexual activity: Never  Lifestyle  . Physical activity:    Days per week: Not on file    Minutes per session: Not on file  . Stress: Not on file  Relationships  . Social connections:    Talks on phone: Not on file    Gets together: Not on file    Attends religious service: Not on file    Active member of club or organization: Not on file    Attends meetings of clubs or organizations: Not on file    Relationship status: Not on file  . Intimate partner violence:    Fear of current or ex partner: Not on file    Emotionally abused: Not on file    Physically abused: Not on file    Forced sexual activity: Not on file  Other Topics Concern  . Not on file  Social History Narrative  . Not on file     ROS:  General: Negative for anorexia, weight  loss, fever, chills, fatigue, weakness. Eyes: Negative for vision changes.  ENT: Negative for hoarseness, difficulty swallowing , nasal congestion. CV: Negative for chest pain, angina, palpitations, dyspnea on exertion, peripheral edema.  Respiratory: Negative for dyspnea at rest, dyspnea on exertion, cough, sputum, wheezing.  GI: See history of present illness. GU:  Negative for dysuria, hematuria, urinary incontinence, urinary frequency, nocturnal urination.  MS: Negative for joint pain, low back pain.  Derm: Negative for rash or itching.  Neuro: Negative for weakness, abnormal sensation, seizure, frequent headaches, memory loss, confusion.  Psych: Negative for anxiety, depression, suicidal ideation, hallucinations.  Endo: Negative for unusual weight change.  Heme: Negative for bruising or bleeding. Allergy: Negative for rash or hives.       Physical Examination: Vital signs in last 24 hours:   Last BM Date: 09/28/18  General: Well-nourished, well-developed in no acute distress.  Head: Normocephalic, atraumatic.   Eyes: Conjunctiva pink, no icterus. Mouth: Oropharyngeal mucosa moist and pink , no lesions erythema or exudate. Neck: Supple without thyromegaly, masses, or lymphadenopathy.  Lungs: Clear to auscultation bilaterally.  Heart: Regular rate and rhythm, no murmurs rubs or gallops.  Abdomen: Bowel sounds are normal, nontender, nondistended, no hepatosplenomegaly or masses, no abdominal bruits or    hernia , no rebound or guarding.   Rectal: Not performed Extremities: No lower extremity edema, clubbing, deformity.  Neuro: Alert and oriented x 4 , grossly normal neurologically.  Skin: Warm and dry, no  rash or jaundice.   Psych: Alert and cooperative, normal mood and affect.        Intake/Output from previous day: No intake/output data recorded. Intake/Output this shift: No intake/output data recorded.  Lab Results: CBC No results for input(s): WBC, HGB, HCT, MCV, PLT  in the last 72 hours. BMET No results for input(s): NA, K, CL, CO2, GLUCOSE, BUN, CREATININE, CALCIUM in the last 72 hours. LFT No results for input(s): BILITOT, BILIDIR, IBILI, ALKPHOS, AST, ALT, PROT, ALBUMIN in the last 72 hours.  Lipase No results for input(s): LIPASE in the last 72 hours.  PT/INR No results for input(s): LABPROT, INR in the last 72 hours.    Imaging Studies: Ct Chest W Contrast  Result Date: 09/24/2018 CLINICAL DATA:  Stage IIC colonic adenocarcinoma status post right hemicolectomy in June 2017. Restaging. EXAM: CT CHEST, ABDOMEN, AND PELVIS WITH CONTRAST TECHNIQUE: Multidetector CT imaging of the chest, abdomen and pelvis was performed following the standard protocol during bolus administration of intravenous contrast. CONTRAST:  123mL OMNIPAQUE IOHEXOL 300 MG/ML  SOLN COMPARISON:  02/12/2018 CT chest, abdomen and pelvis. FINDINGS: CT CHEST FINDINGS Cardiovascular: Top-normal heart size, stable. No significant pericardial effusion/thickening. Coronary atherosclerosis. Left subclavian Port-A-Cath terminates in the middle third of the SVC. Atherosclerotic nonaneurysmal thoracic aorta. Stable dilated main pulmonary artery (3.5 cm diameter). No central pulmonary emboli. Mediastinum/Nodes: Subcentimeter hypodense left thyroid nodules are stable. Unremarkable esophagus. No pathologically enlarged axillary, mediastinal or hilar lymph nodes. Lungs/Pleura: No pneumothorax. No pleural effusion. No acute consolidative airspace disease or lung masses. Posterior right upper lobe 1.0 cm solid pulmonary nodule (series 4/image 40) is stable since 01/26/2016 chest CT, most compatible with a benign nodule. No new significant pulmonary nodules. Musculoskeletal:  No aggressive appearing focal osseous lesions. CT ABDOMEN PELVIS FINDINGS Hepatobiliary: Normal liver size. Subcentimeter hypodense inferior right liver lobe lesion is too small to characterize and is unchanged since 01/26/2016 CT,  considered benign. No new liver lesions. Normal gallbladder with no radiopaque cholelithiasis. No biliary ductal dilatation. Pancreas: Normal, with no mass or duct dilation. Spleen: Normal size. No mass. Adrenals/Urinary Tract: Normal adrenals. 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 Stable mild fullness of the renal collecting systems without overt hydronephrosis. Symmetric normal contrast nephrograms. Numerous subcentimeter hypodense renal cortical lesions in both kidneys are too small to characterize and are not appreciably changed, considered benign. No new renal lesions. Questionable mild diffuse bladder wall thickening, possibly due to under distension. Stomach/Bowel: Normal non-distended stomach. Stable postsurgical changes from right hemicolectomy with ileocolic anastomosis in the right abdomen. No small bowel dilatation or wall thickening. No anastomotic mass or wall thickening. Oral contrast transits to the splenic flexure of the colon. Moderate stool throughout the remnant large-bowel. No definite large bowel wall thickening, diverticulosis or significant pericolonic fat stranding. Vascular/Lymphatic: Atherosclerotic nonaneurysmal abdominal aorta. Patent portal, splenic, hepatic and renal veins. No pathologically enlarged lymph nodes in the abdomen or pelvis. Reproductive: Intrauterine device appears low lying in the uterine cavity, unchanged. No adnexal masses. Other: No pneumoperitoneum, ascites or focal fluid collection. Musculoskeletal: No aggressive appearing focal osseous lesions. Mild lumbar spondylosis. IMPRESSION: 1. Stable postsurgical changes from right hemicolectomy with no evidence of anastomotic tumor recurrence. 2. No findings of metastatic disease. 3. Low lying [name] in the uterine cavity, unchanged. Suggest correlation with pelvic ultrasound. 4. Questionable mild diffuse bladder wall thickening, which may be due to under distention. Urinalysis correlation  may be obtained as clinically warranted. 5.  Aortic Atherosclerosis (ICD10-I70.0). Electronically Signed  By: Ilona Sorrel M.D.   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 Restaging. EXAM: CT CHEST, ABDOMEN, AND PELVIS WITH CONTRAST TECHNIQUE: Multidetector CT imaging of the chest, abdomen and pelvis was performed following the standard protocol during bolus administration of intravenous contrast. CONTRAST:  1108mL OMNIPAQUE IOHEXOL 300 MG/ML  SOLN COMPARISON:  02/12/2018 CT chest, abdomen and pelvis. FINDINGS: CT CHEST FINDINGS Cardiovascular: Top-normal heart size, stable. No significant pericardial effusion/thickening. Coronary atherosclerosis. Left subclavian Port-A-Cath terminates in the middle third of the SVC. Atherosclerotic nonaneurysmal thoracic aorta. Stable dilated main pulmonary artery (3.5 cm diameter). No central pulmonary emboli. Mediastinum/Nodes: Subcentimeter hypodense left thyroid nodules are stable. Unremarkable esophagus. No pathologically enlarged axillary, mediastinal or hilar lymph nodes. Lungs/Pleura: No pneumothorax. No pleural effusion. No acute consolidative airspace disease or lung masses. Posterior right upper lobe 1.0 cm solid pulmonary nodule (series 4/image 40) is stable since 01/26/2016 chest CT, most compatible with a benign nodule. No new significant pulmonary nodules. Musculoskeletal:  No aggressive appearing focal osseous lesions. CT ABDOMEN PELVIS FINDINGS Hepatobiliary: Normal liver size. Subcentimeter hypodense inferior right liver lobe lesion is too small to characterize and is unchanged since 01/26/2016 CT, considered benign. No new liver lesions. Normal gallbladder with no radiopaque cholelithiasis. No biliary ductal dilatation. Pancreas: Normal, with no mass or duct dilation. Spleen: Normal size. No mass. Adrenals/Urinary Tract: Normal  adrenals. 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 Stable mild fullness of the renal collecting systems without overt hydronephrosis. Symmetric normal contrast nephrograms. Numerous subcentimeter hypodense renal cortical lesions in both kidneys are too small to characterize and are not appreciably changed, considered benign. No new renal lesions. Questionable mild diffuse bladder wall thickening, possibly due to under distension. Stomach/Bowel: Normal non-distended stomach. Stable postsurgical changes from right hemicolectomy with ileocolic anastomosis in the right abdomen. No small bowel dilatation or wall thickening. No anastomotic mass or wall thickening. Oral contrast transits to the splenic flexure of the colon. Moderate stool throughout the remnant large-bowel. No definite large bowel wall thickening, diverticulosis or significant pericolonic fat stranding. Vascular/Lymphatic: Atherosclerotic nonaneurysmal abdominal aorta. Patent portal, splenic, hepatic and renal veins. No pathologically enlarged lymph nodes in the abdomen or pelvis. Reproductive: Intrauterine device appears low lying in the uterine cavity, unchanged. No adnexal masses. Other: No pneumoperitoneum, ascites or focal fluid collection. Musculoskeletal: No aggressive appearing focal osseous lesions. Mild lumbar spondylosis. IMPRESSION: 1. Stable postsurgical changes from right hemicolectomy with no evidence of anastomotic tumor recurrence. 2. No findings of metastatic disease. 3. Low lying [name] in the uterine cavity, unchanged. Suggest correlation with pelvic ultrasound. 4. Questionable mild diffuse bladder wall thickening, which may be due to under distention. Urinalysis correlation may be obtained as clinically warranted. 5.  Aortic Atherosclerosis (ICD10-I70.0). Electronically Signed   By: Ilona Sorrel M.D.   On: 09/24/2018 14:19   Mm Digital Diagnostic Unilat R  Result Date: 09/08/2018 CLINICAL DATA:   Screening recall for right breast calcifications. EXAM: DIGITAL DIAGNOSTIC RIGHT MAMMOGRAM WITH CAD COMPARISON:  Previous exam(s). ACR Breast Density Category c: The breast tissue is heterogeneously dense, which may obscure small masses. FINDINGS: Additional magnification views were performed of the right breast. There are 2 groups of of predominantly round and punctate calcifications in the lower outer right breast, with the smaller group in the lower central to slightly outer right breast measuring 6 mm and the additional slightly more  superior located group measuring 1.7 cm. This smaller group of calcifications may layer on the spot compression magnification ML view, however this wasdifficult to definitively determine with this appearance possibly related to patient motion. Mammographic images were processed with CAD. IMPRESSION: Indeterminate right breast calcifications. RECOMMENDATION: Recommend stereotactic guided biopsy of the 2 adjacent groups of calcifications in the right breast. I have discussed the findings and recommendations with the patient. Results were also provided in writing at the conclusion of the visit. If applicable, a reminder letter will be sent to the patient regarding the next appointment. BI-RADS CATEGORY  4: Suspicious. Electronically Signed   By: Everlean Alstrom M.D.   On: 09/08/2018 11:09   Mm Clip Placement Right  Result Date: 09/23/2018 CLINICAL DATA:  Status post stereotactic biopsies of the right breast. EXAM: DIAGNOSTIC RIGHT MAMMOGRAM POST STEREOTACTIC BIOPSIES COMPARISON:  Previous exam(s). FINDINGS: Mammographic images were obtained following stereotactic guided biopsies of the right breast. Mammographic images show there coil shaped and X shaped clips in the lower outer quadrant of the right breast in appropriate position. IMPRESSION: Status post stereotactic biopsies of the right breast with pathology pending. Final Assessment: Post Procedure Mammograms for Marker  Placement Electronically Signed   By: Lillia Mountain M.D.   On: 09/23/2018 11:35   Mm Rt Breast Bx W Loc Dev 1st Lesion Image Bx Spec Stereo Guide  Addendum Date: 09/24/2018   ADDENDUM REPORT: 09/24/2018 13:44 ADDENDUM: Pathology revealed FIBROCYSTIC CHANGES WITH CALCIFICATIONS of the RIGHT breast, lower outer quadrant, both sites, coil clip and X clip. This was found to be concordant by Dr. Lillia Mountain. Pathology results were discussed with Laure Kidney ([place]) from [facility] by telephone, per request .She reported the patient doing well after the biopsy with tenderness at the site. Post biopsy instructions and care were reviewed and questions were answered. She was encouraged to call [facility] for any additional concerns. The patient was instructed to return for annual screening mammography and informed a reminder notice would be sent regarding this appointment. Pathology results reported by Stacie Acres, RN on 09/24/2018. Electronically Signed   By: Lillia Mountain M.D.   On: 09/24/2018 13:44   Result Date: 09/24/2018 CLINICAL DATA:  Two areas of suspicious calcifications in the right breast. EXAM: RIGHT BREAST STEREOTACTIC CORE NEEDLE BIOPSIES COMPARISON:  Previous exams. FINDINGS: The patient and I discussed the procedure of stereotactic-guided biopsy including benefits and alternatives. We discussed the high likelihood of a successful procedure. We discussed the risks of the procedure including infection, bleeding, tissue injury, clip migration, and inadequate sampling. Informed written consent was given. The usual time out protocol was performed immediately prior to the procedure. Using sterile technique and 1% lidocaine 1% lidocaine with epinephrine as local anesthetic, under stereotactic guidance, a 9 gauge vacuum assisted device was used to perform core needle biopsy of calcifications in the lower outer quadrant of the right breast using a lateral to  medial approach. Specimen radiograph was performed showing calcifications are present in the tissue samples. Specimens with calcifications are identified for pathology. Lesion quadrant: Lower outer quadrant At the conclusion of the procedure, a coil shaped tissue marker clip was deployed into the biopsy cavity. Follow-up 2-view mammogram was performed and dictated separately. The patient and I discussed the procedure of stereotactic-guided biopsy including benefits and alternatives. We discussed the high likelihood of a successful procedure. We discussed the risks of the procedure including infection, bleeding, tissue injury, clip migration, and  inadequate sampling. Informed written consent was given. The usual time out protocol was performed immediately prior to the procedure. Using sterile technique and 1% lidocaine 1% lidocaine with epinephrine as local anesthetic, under stereotactic guidance, a 9 gauge vacuum assisted device was used to perform core needle biopsy of calcifications in the lower outer quadrant of the right breast using a lateral to medial approach. Specimen radiograph was performed showing calcifications are present in the tissue sample. Specimens with calcifications are identified for pathology. Lesion quadrant: Lower outer quadrant At the conclusion of the procedure, a X shaped tissue marker clip was deployed into the biopsy cavity. Follow-up 2-view mammogram was performed and dictated separately. IMPRESSION: Stereotactic-guided biopsies of the right breast. No apparent complications. Electronically Signed: By: Lillia Mountain M.D. On: 09/23/2018 11:21   Mm Rt Breast Bx W Loc Dev Ea Ad Lesion Img Bx Spec Stereo Guide  Addendum Date: 09/24/2018   ADDENDUM REPORT: 09/24/2018 13:44 ADDENDUM: Pathology revealed FIBROCYSTIC CHANGES WITH CALCIFICATIONS of the RIGHT breast, lower outer quadrant, both sites, coil clip and X clip. This was found to be concordant by Dr. Lillia Mountain. Pathology results were  discussed with Laure Kidney ([place]) from [facility] by telephone, per request .She reported the patient doing well after the biopsy with tenderness at the site. Post biopsy instructions and care were reviewed and questions were answered. She was encouraged to call [facility] for any additional concerns. The patient was instructed to return for annual screening mammography and informed a reminder notice would be sent regarding this appointment. Pathology results reported by Stacie Acres, RN on 09/24/2018. Electronically Signed   By: Lillia Mountain M.D.   On: 09/24/2018 13:44   Result Date: 09/24/2018 CLINICAL DATA:  Two areas of suspicious calcifications in the right breast. EXAM: RIGHT BREAST STEREOTACTIC CORE NEEDLE BIOPSIES COMPARISON:  Previous exams. FINDINGS: The patient and I discussed the procedure of stereotactic-guided biopsy including benefits and alternatives. We discussed the high likelihood of a successful procedure. We discussed the risks of the procedure including infection, bleeding, tissue injury, clip migration, and inadequate sampling. Informed written consent was given. The usual time out protocol was performed immediately prior to the procedure. Using sterile technique and 1% lidocaine 1% lidocaine with epinephrine as local anesthetic, under stereotactic guidance, a 9 gauge vacuum assisted device was used to perform core needle biopsy of calcifications in the lower outer quadrant of the right breast using a lateral to medial approach. Specimen radiograph was performed showing calcifications are present in the tissue samples. Specimens with calcifications are identified for pathology. Lesion quadrant: Lower outer quadrant At the conclusion of the procedure, a coil shaped tissue marker clip was deployed into the biopsy cavity. Follow-up 2-view mammogram was performed and dictated separately. The patient and I discussed the procedure of  stereotactic-guided biopsy including benefits and alternatives. We discussed the high likelihood of a successful procedure. We discussed the risks of the procedure including infection, bleeding, tissue injury, clip migration, and inadequate sampling. Informed written consent was given. The usual time out protocol was performed immediately prior to the procedure. Using sterile technique and 1% lidocaine 1% lidocaine with epinephrine as local anesthetic, under stereotactic guidance, a 9 gauge vacuum assisted device was used to perform core needle biopsy of calcifications in the lower outer quadrant of the right breast using a lateral to medial approach. Specimen radiograph was performed showing calcifications are present in the tissue sample. Specimens with calcifications are identified  for pathology. Lesion quadrant: Lower outer quadrant At the conclusion of the procedure, a X shaped tissue marker clip was deployed into the biopsy cavity. Follow-up 2-view mammogram was performed and dictated separately. IMPRESSION: Stereotactic-guided biopsies of the right breast. No apparent complications. Electronically Signed: By: Lillia Mountain M.D. On: 09/23/2018 11:21  [4 week]   Impression: Very pleasant 63 year old female presenting from a group home for assistance with bowel preparation for surveillance colonoscopy.  She has a history of stage IIc adenocarcinoma of the colon diagnosed in June 2017.  She is overdue for surveillance colonoscopy.  CT last July with questionable rectal abnormality but recent CT reassuring.  Baseline tendency towards constipation.  Plan for bowel prep today.  Patient is currently scheduled for colonoscopy at 25 tomorrow with propofol given polypharmacy.  Plan: 1. Bowel prep for colonoscopy today.  I have spoken to patient's nurse in details regarding plans.   2. Colonoscopy with propofol given polypharmacy, scheduled for tomorrow at 1030.  We would like to thank you for the opportunity  to participate in the care of Mary Wells. Laureen Ochs. Bernarda Caffey [facility] [phone] 2/26/202012:31 PM     LOS: 1 day

## 2018-09-30 NOTE — Progress Notes (Signed)
Discussed with Dr. Oneida Alar. Given two tap water enemas, one now and repeat in one hour.   Give Miralax, X 4 doses started at 6pm. See new orders.   Laureen Ochs. Bernarda Caffey [facility] [phone] 2/26/20204:15 PM

## 2018-09-30 NOTE — H&P (Signed)
History and Physical  Mary Wells OQH:[id number] DOB: 21-Jan-1956 DOA: 09/30/2018   PCP: Rosita Fire, MD   Patient coming from: Home  Chief Complaint: rectal mass  HPI:  Mary Wells is a 63 y.o. female with medical history of adenocarcinoma of the colon, diabetes mellitus type 2, iron deficiency anemia, and schizophrenia presenting with rectal mass and scheduled colonoscopy.  The patient was originally diagnosed with stage IIb adenocarcinoma of the colon in June 2017.  She underwent a right hemicolectomy with resection of to the terminal ileum on 01/29/2016.  Liver biopsy was benign at that time.  She has not had a surveillance colonoscopy.  The patient had followed up with med/onc, Dr. Walden Field.  She had a surveillance CT of the abdomen and pelvis on 02/12/2018 which showed a possible rectal mass.  She was subsequently referred to GI for colonoscopy.  However the patient has been poorly compliant with follow-up appointments. She  Resides at [place] from home is present. Sometimes feels a little nauseated every now and then "not much, just a tiny bit". BM every day. No rectal bleeding. She has Good appetite.  Because the patient has had poor compliance with cold liquids and difficulty with preps, the patient was admitted for observation for her colonoscopy.  Notably, the patient's iron has been held since 09/24/2018.  Assessment/Plan: Adenocarcinoma of the colon -02/12/2018 CT abdomen pelvis shows possible rectal mass without any other signs of metastasis -Planning for colonoscopy 10/01/2018 -GI consulted -09/24/2018 CT abdomen/pelvis--stable postsurgical changes from R-hemicolectomy without any evidence of anastomotic tumor recurrence; low-lying [name] in the uterine cavity; bladder wall thickening  Pulmonary nodule -Has been stable on serial CT scans -Follow-up with Dr. Walden Field  Diabetes mellitus type 2 -Holding metformin -NovoLog sliding scale -Hemoglobin  A1c  Hyperlipidemia -Restart statin once able to tolerate p.o.  Schizophrenia -Continue home doses of Cogentin, Klonopin, Depakote, Prolixin        Past Medical History:  Diagnosis Date  . Adenocarcinoma of colon ([place])   . Allergy   . Diabetes mellitus without complication ([place]) 5465  . GERD (gastroesophageal reflux disease)   . Iron deficiency anemia 09/18/2013  . Schizophrenia [facility])    Past Surgical History:  Procedure Laterality Date  . BIOPSY  01/24/2016   Procedure: BIOPSY;  Surgeon: Daneil Dolin, MD;  Location: [facility] END[facility] SUITE;  Service: Endoscopy;;  cecal mass  . COLONOSCOPY  04/21/2006   SLF: limited colonoscopy due to a poor bowel prep   . COLONOSCOPY  02/22/2009   SLF: poor bowel prep/Formed stools in the right colon which limited the extent of the exam/The scope was passed to approximately the proximal transverse colon.  No polyps, masses, inflammatory changes, diverticular/normal rectum  . COLONOSCOPY WITH PROPOFOL N/A 01/24/2016   Dr. Gala Romney: large fungating, exophytic tumor arising out of the base of the cecum/appendix. Partially obscuring the ileocecal valve  . EXCISIONAL HEMORRHOIDECTOMY    . LIVER BIOPSY N/A 01/29/2016   Procedure: LIVER BIOPSY;  Surgeon: Aviva Signs, MD;  Location: [facility] ORS;  Service: General;  Laterality: N/A;  . PARTIAL COLECTOMY N/A 01/29/2016   Procedure: PARTIAL COLECTOMY ;  Surgeon: Aviva Signs, MD;  Location: [facility] ORS;  Service: General;  Laterality: N/A;  . PORTACATH PLACEMENT Left 01/29/2016   Procedure: INSERTION PORT-A-CATH;  Surgeon: Aviva Signs, MD;  Location: [facility] ORS;  Service: General;  Laterality: Left;  left subclavian   Social History:  reports that she has been smoking  cigarettes. She has a 9.50 pack-year smoking history. She has never used smokeless tobacco. She reports that she does not drink alcohol or use drugs.   Family History  Problem Relation Age of Onset  . Leukemia Paternal Grandfather   . Leukemia Cousin   . Colon  cancer Neg Hx      No Known Allergies   Prior to Admission medications   Medication Sig Start Date End Date Taking? Authorizing Provider  benztropine (COGENTIN) 1 MG tablet Take 1 mg by mouth 2 (two) times daily.    [provider]  clonazePAM (KLONOPIN) 0.5 MG tablet Take 0.5 mg by mouth 2 (two) times daily.     [provider]  divalproex (DEPAKOTE ER) 250 MG 24 hr tablet Take 250 mg by mouth at bedtime.    [provider]  docusate sodium (COLACE) 100 MG capsule Take 100 mg by mouth daily as needed for mild constipation.    [provider]  famotidine (PEPCID) 20 MG tablet Take 20 mg by mouth daily.    [provider]  ferrous sulfate 325 (65 FE) MG tablet Take 325 mg by mouth 2 (two) times daily with a meal. Pt has colonoscopy on 2/26 and is currently holding iron tablet until after procedure    [provider]  fluPHENAZine (PROLIXIN) 5 MG tablet Take 5 mg by mouth 2 (two) times daily.    [provider]  loratadine (CLARITIN) 10 MG tablet Take 10 mg by mouth daily.    [provider]  metFORMIN (GLUCOPHAGE) 500 MG tablet Take 500 mg by mouth 2 (two) times daily with a meal.     [provider]  NON FORMULARY Take 1 capsule by mouth 2 (two) times daily. Fiber capsules 520 mg.    [provider]  simvastatin (ZOCOR) 20 MG tablet Take 20 mg by mouth daily.    [provider]    Review of Systems:  Constitutional:  No weight loss, night sweats, Fevers, chills, fatigue.  Head&Eyes: No headache.  No vision loss.  No eye pain or scotoma ENT:  No Difficulty swallowing,Tooth/dental problems,Sore throat,  No ear ache, post nasal drip,  Cardio-vascular:  No chest pain, Orthopnea, PND, swelling in lower extremities,  dizziness, palpitations  GI:  No  abdominal pain, nausea, vomiting, diarrhea, loss of appetite, hematochezia, melena, heartburn, indigestion, Resp:  No shortness of breath with  exertion or at rest. No cough. No coughing up of blood .No wheezing.No chest wall deformity  Skin:  no rash or lesions.  GU:  no dysuria, change in color of urine, no urgency or frequency. No flank pain.  Musculoskeletal:  No joint pain or swelling. No decreased range of motion. No back pain.  Psych:  No change in mood or affect. No depression or anxiety. Neurologic: No headache, no dysesthesia, no focal weakness, no vision loss. No syncope  Physical Exam: There were no vitals filed for this visit. General:  A&O x 3, NAD, nontoxic, pleasant/cooperative Head/Eye: No conjunctival hemorrhage, no icterus, [place]/AT, No nystagmus ENT:  No icterus,  No thrush, good dentition, no pharyngeal exudate Neck:  No masses, no lymphadenpathy, no bruits CV:  RRR, no rub, no gallop, no S3 Lung:  CTAB, good air movement, no wheeze, no rhonchi Abdomen: soft/NT, +BS, nondistended, no peritoneal signs Ext: No cyanosis, No rashes, No petechiae, No lymphangitis, No edema Neuro: CNII-XII intact, strength 4/5 in bilateral upper and lower extremities, no dysmetria  Labs on Admission:  Basic  Metabolic Panel: No results for input(s): NA, K, CL, CO2, GLUCOSE, BUN, CREATININE, CALCIUM, MG, PHOS in the last 168 hours. Liver Function Tests: No results for input(s): AST, ALT, ALKPHOS, BILITOT, PROT, ALBUMIN in the last 168 hours. No results for input(s): LIPASE, AMYLASE in the last 168 hours. No results for input(s): AMMONIA in the last 168 hours. CBC: No results for input(s): WBC, NEUTROABS, HGB, HCT, MCV, PLT in the last 168 hours. Coagulation Profile: No results for input(s): INR, PROTIME in the last 168 hours. Cardiac Enzymes: No results for input(s): CKTOTAL, CKMB, CKMBINDEX, TROPONINI in the last 168 hours. BNP: Invalid input(s): POCBNP CBG: No results for input(s): GLUCAP in the last 168 hours. Urine analysis:    Component Value Date/Time   COLORURINE YELLOW 01/27/2016 0556   APPEARANCEUR HAZY (A)  01/27/2016 0556   LABSPEC <1.005 (L) 01/27/2016 0556   PHURINE 7.0 01/27/2016 0556   GLUCOSEU NEGATIVE 01/27/2016 0556   HGBUR TRACE (A) 01/27/2016 0556   BILIRUBINUR NEGATIVE 01/27/2016 0556   KETONESUR NEGATIVE 01/27/2016 0556   PROTEINUR NEGATIVE 01/27/2016 0556   NITRITE NEGATIVE 01/27/2016 0556   LEUKOCYTESUR LARGE (A) 01/27/2016 0556   Sepsis Labs: @LABRCNTIP (procalcitonin:4,lacticidven:4) )No results found for this or any previous visit (from the past 240 hour(s)).   Radiological Exams on Admission: No results found.      Time spent:60 minutes Code Status:   FULL Family Communication:  No Family at bedside Disposition Plan: expect 1 day hospitalization Consults called: GI DVT Prophylaxis: [place] Lovenox  Orson Eva, DO  [facility] Pager [phone]  If 7PM-7AM, please contact night-coverage [email or web address] Password [facility] 09/30/2018, 11:31 AM

## 2018-09-30 NOTE — Progress Notes (Signed)
Spoke to Ms. Mary Le, RN regarding patient. She has consumed 3/4 of bowel prep. Few BMs so far. Will touch base in couple of hours to see how she is doing and decide regarding further prep needs. Patient is now on schedule for 2pm tomorrow due to scheduling changes.   Mary Wells. Mary Wells [facility] [phone] 2/26/20203:48 PM

## 2018-10-01 ENCOUNTER — Observation Stay (HOSPITAL_COMMUNITY): Payer: Medicaid Other | Admitting: Anesthesiology

## 2018-10-01 ENCOUNTER — Encounter (HOSPITAL_COMMUNITY): Payer: Self-pay | Admitting: *Deleted

## 2018-10-01 ENCOUNTER — Encounter (HOSPITAL_COMMUNITY)
Admission: RE | Disposition: A | Discharge status: Other Acute Inpatient Hospital | Payer: Self-pay | Source: Ambulatory Visit | Attending: Internal Medicine

## 2018-10-01 DIAGNOSIS — Z85038 Personal history of other malignant neoplasm of large intestine: Secondary | ICD-10-CM

## 2018-10-01 DIAGNOSIS — R933 Abnormal findings on diagnostic imaging of other parts of digestive tract: Secondary | ICD-10-CM

## 2018-10-01 DIAGNOSIS — D508 Other iron deficiency anemias: Secondary | ICD-10-CM | POA: Diagnosis not present

## 2018-10-01 DIAGNOSIS — C189 Malignant neoplasm of colon, unspecified: Secondary | ICD-10-CM | POA: Diagnosis not present

## 2018-10-01 DIAGNOSIS — K648 Other hemorrhoids: Secondary | ICD-10-CM | POA: Diagnosis not present

## 2018-10-01 DIAGNOSIS — D649 Anemia, unspecified: Secondary | ICD-10-CM

## 2018-10-01 DIAGNOSIS — K644 Residual hemorrhoidal skin tags: Secondary | ICD-10-CM | POA: Diagnosis not present

## 2018-10-01 DIAGNOSIS — K6289 Other specified diseases of anus and rectum: Secondary | ICD-10-CM | POA: Diagnosis not present

## 2018-10-01 DIAGNOSIS — Z08 Encounter for follow-up examination after completed treatment for malignant neoplasm: Secondary | ICD-10-CM | POA: Diagnosis not present

## 2018-10-01 DIAGNOSIS — F209 Schizophrenia, unspecified: Secondary | ICD-10-CM | POA: Diagnosis not present

## 2018-10-01 HISTORY — PX: COLONOSCOPY WITH PROPOFOL: SHX5780

## 2018-10-01 LAB — GLUCOSE, CAPILLARY
Glucose-Capillary: 83 mg/dL (ref 70–99)
Glucose-Capillary: 103 mg/dL — ABNORMAL HIGH (ref 70–99)
Glucose-Capillary: 88 mg/dL (ref 70–99)
Glucose-Capillary: 88 mg/dL (ref 70–99)

## 2018-10-01 LAB — HIV ANTIBODY (ROUTINE TESTING W REFLEX): HIV Screen 4th Generation wRfx: NONREACTIVE

## 2018-10-01 SURGERY — COLONOSCOPY WITH PROPOFOL
Anesthesia: Monitor Anesthesia Care

## 2018-10-01 MED ORDER — LIDOCAINE HCL (CARDIAC) PF 100 MG/5ML IV SOSY
PREFILLED_SYRINGE | INTRAVENOUS | Status: DC | PRN
  Administered 2018-10-01: 40 mg via INTRAVENOUS

## 2018-10-01 MED ORDER — ATROPINE SULFATE 0.4 MG/ML IJ SOLN
INTRAMUSCULAR | Status: DC | PRN
  Administered 2018-10-01: 0.4 mg via INTRAVENOUS

## 2018-10-01 MED ORDER — STERILE WATER FOR IRRIGATION IR SOLN
Status: DC | PRN
  Administered 2018-10-01: 100 mL

## 2018-10-01 MED ORDER — LACTATED RINGERS IV SOLN
INTRAVENOUS | Status: DC
  Administered 2018-10-01: 1000 mL via INTRAVENOUS

## 2018-10-01 MED ORDER — SODIUM CHLORIDE 0.9 % IV SOLN: INTRAVENOUS | Status: DC

## 2018-10-01 MED ORDER — GLYCOPYRROLATE 0.2 MG/ML IJ SOLN
INTRAMUSCULAR | Status: DC | PRN
  Administered 2018-10-01: 0.2 mg via INTRAVENOUS

## 2018-10-01 MED ORDER — PROPOFOL 500 MG/50ML IV EMUL
INTRAVENOUS | Status: DC | PRN
  Administered 2018-10-01: 14:00:00 via INTRAVENOUS
  Administered 2018-10-01: 150 ug/kg/min via INTRAVENOUS

## 2018-10-01 NOTE — Op Note (Signed)
[facility] Patient Name: Mary Wells Procedure Date: 10/01/2018 1:12 PM MRN: [id number] Date of Birth: November 10, 1955 Attending MD: Barney Drain MD, MD CSN: [id number] Age: 63 Admit Type: Inpatient Procedure:                Colonoscopy, diagnostic Indications:              Personal history of malignant neoplasm of the                            colon, Abnormal CT of the GI tract Providers:                Barney Drain MD, MD, Rosina Lowenstein, RN, Aram Candela Referring MD:             Rosita Fire MD, MD Medicines:                Propofol per Anesthesia Complications:            No immediate complications. Estimated Blood Loss:     Estimated blood loss: none. Procedure:                Pre-Anesthesia Assessment:                           - Prior to the procedure, a History and Physical                            was performed, and patient medications and                            allergies were reviewed. The patient's tolerance of                            previous anesthesia was also reviewed. The risks                            and benefits of the procedure and the sedation                            options and risks were discussed with the patient.                            All questions were answered, and informed consent                            was obtained. Prior Anticoagulants: The patient has                            taken no previous anticoagulant or antiplatelet                            agents. ASA Grade Assessment: II - A patient with                            mild systemic disease. After reviewing the risks  and benefits, the patient was deemed in                            satisfactory condition to undergo the procedure.                            After obtaining informed consent, the colonoscope                            was passed under direct vision. Throughout the                            procedure, the patient's blood pressure,  pulse, and                            oxygen saturations were monitored continuously. The                            [id number]-[id number] ([id number]) scope was introduced through                            the anus and advanced to the the ileocolonic                            anastomosis. The colonoscopy was somewhat difficult                            due to a tortuous colon. Successful completion of                            the procedure was aided by straightening and                            shortening the scope to obtain bowel loop reduction                            and COLOWRAP. The patient tolerated the procedure                            well. The quality of the bowel preparation was                            good. The rectum and ANASTOMOSIS AND RECTUM were                            photographed. Scope In: 1:30:16 PM Scope Out: 1:58:47 PM Scope Withdrawal Time: 0 hours 22 minutes 46 seconds  Total Procedure Duration: 0 hours 28 minutes 31 seconds  Findings:      The neo-terminal ileum appeared normal.      The recto-sigmoid colon, sigmoid colon and descending colon revealed       moderately excessive looping.      External and internal hemorrhoids were found. Impression:               -  The examined portion of the ileum was normal.                           - There was significant looping of the colon.                           - External and internal hemorrhoids.                           - NO MASS IN RECTUM Moderate Sedation:      Per Anesthesia Care Recommendation:           - Patient has a contact number available for                            emergencies. The signs and symptoms of potential                            delayed complications were discussed with the                            patient. Return to normal activities tomorrow.                            Written discharge instructions were provided to the                            patient.                            - High fiber diet.                           - Continue present medications.                           - Await pathology results.                           - Repeat colonoscopy in 5 years for surveillance. Procedure Code(s):        --- Professional ---                           [phone], Colonoscopy, flexible; diagnostic, including                            collection of specimen(s) by brushing or washing,                            when performed (separate procedure) Diagnosis Code(s):        --- Professional ---                           V42.5, Other hemorrhoids                           Z85.038, Personal history of other  malignant                            neoplasm of large intestine                           R93.3, Abnormal findings on diagnostic imaging of                            other parts of digestive tract CPT copyright 2018 American [employer] Association. All rights reserved. The codes documented in this report are preliminary and upon coder review may  be revised to meet current compliance requirements. Barney Drain, MD Barney Drain MD, MD 10/01/2018 2:16:47 PM This report has been signed electronically. Number of Addenda: 0

## 2018-10-01 NOTE — Anesthesia Procedure Notes (Signed)
Procedure Name: MAC Date/Time: 10/01/2018 1:22 PM Performed by: Andree Elk Amy A, CRNA Pre-anesthesia Checklist: Patient identified, Emergency Drugs available, Suction available, Timeout performed and Patient being monitored Patient Re-evaluated:Patient Re-evaluated prior to induction Oxygen Delivery Method: Non-rebreather mask

## 2018-10-01 NOTE — Progress Notes (Signed)
Patient scheduled for colonoscopy today so I held AM meds.

## 2018-10-01 NOTE — Clinical Social Work Note (Signed)
LCSW spoke with Ms. Mary Wells at [facility]. Patient ambulates independently, requires assistance with bathing and dressing. Patient has episodes of bladder incontinence. LCSW advised that patient will discharge after her colonoscopy today.    Zali Kamaka, Clydene Pugh, LCSW

## 2018-10-01 NOTE — Progress Notes (Signed)
Discussed with nursing staff. Patient tolerated complete bowel prep and stools are clear.   Mary Wells. Mary Wells [facility] [phone] 2/27/20209:40 AM

## 2018-10-01 NOTE — Anesthesia Postprocedure Evaluation (Signed)
Anesthesia Post Note  Patient: Mary Wells  Procedure(s) Performed: COLONOSCOPY WITH PROPOFOL (N/A )  Patient location during evaluation: PACU Anesthesia Type: MAC Level of consciousness: awake and alert and oriented Pain management: pain level controlled Vital Signs Assessment: post-procedure vital signs reviewed and stable Respiratory status: spontaneous breathing Cardiovascular status: stable Postop Assessment: no apparent nausea or vomiting Anesthetic complications: no     Last Vitals:  Vitals:   10/01/18 1235 10/01/18 1410  BP: 120/62 (!) 136/91  Pulse: (!) 47 73  Resp: 20 15  Temp: 37 C (P) 36.5 C  SpO2: 98%     Last Pain:  Vitals:   10/01/18 1359  TempSrc:   PainSc: 0-No pain                 ADAMS, AMY A

## 2018-10-01 NOTE — Anesthesia Preprocedure Evaluation (Signed)
Anesthesia Evaluation  Patient identified by MRN, date of birth, ID band Patient awake    Reviewed: Allergy & Precautions, H&P , NPO status , Patient's Chart, lab work & pertinent test results  Airway Mallampati: II  TM Distance: >3 FB Neck ROM: full    Dental no notable dental hx. (+) Edentulous Upper   Pulmonary neg pulmonary ROS, Current Smoker,    Pulmonary exam normal breath sounds clear to auscultation       Cardiovascular Exercise Tolerance: Good negative cardio ROS   Rhythm:regular Rate:Normal     Neuro/Psych PSYCHIATRIC DISORDERS Schizophrenia negative neurological ROS     GI/Hepatic Neg liver ROS, GERD  ,Adenocarcinoma of colon   Endo/Other  negative endocrine ROSdiabetes  Renal/GU negative Renal ROS  negative genitourinary   Musculoskeletal   Abdominal   Peds  Hematology  (+) Blood dyscrasia, anemia ,   Anesthesia Other Findings   Reproductive/Obstetrics negative OB ROS                             Anesthesia Physical Anesthesia Plan  ASA: III  Anesthesia Plan: MAC   Post-op Pain Management:    Induction:   PONV Risk Score and Plan:   Airway Management Planned:   Additional Equipment:   Intra-op Plan:   Post-operative Plan:   Informed Consent: I have reviewed the patients History and Physical, chart, labs and discussed the procedure including the risks, benefits and alternatives for the proposed anesthesia with the patient or authorized representative who has indicated his/her understanding and acceptance.     Dental Advisory Given  Plan Discussed with: CRNA  Anesthesia Plan Comments:         Anesthesia Quick Evaluation

## 2018-10-01 NOTE — H&P (Signed)
Primary Care Physician:  Rosita Fire, MD Primary Gastroenterologist:  Dr. Oneida Alar  Pre-Procedure History & Physical: HPI:  Mary Wells is a 63 y.o. female here for PERSONAL HISTORY OF colon cancer in 2017  Past Medical History:  Diagnosis Date  . Adenocarcinoma of colon ([place])   . Allergy   . Diabetes mellitus without complication ([place]) 9937  . GERD (gastroesophageal reflux disease)   . Iron deficiency anemia 09/18/2013  . Schizophrenia [facility])     Past Surgical History:  Procedure Laterality Date  . BIOPSY  01/24/2016   Procedure: BIOPSY;  Surgeon: Daneil Dolin, MD;  Location: [facility] SUITE;  Service: Endoscopy;;  cecal mass  . COLONOSCOPY  04/21/2006   SLF: limited colonoscopy due to a poor bowel prep   . COLONOSCOPY  02/22/2009   SLF: poor bowel prep/Formed stools in the right colon which limited the extent of the exam/The scope was passed to approximately the proximal transverse colon.  No polyps, masses, inflammatory changes, diverticular/normal rectum  . COLONOSCOPY WITH PROPOFOL N/A 01/24/2016   Dr. Gala Romney: large fungating, exophytic tumor arising out of the base of the cecum/appendix. Partially obscuring the ileocecal valve  . EXCISIONAL HEMORRHOIDECTOMY    . LIVER BIOPSY N/A 01/29/2016   Procedure: LIVER BIOPSY;  Surgeon: Aviva Signs, MD;  Location: [facility] ORS;  Service: General;  Laterality: N/A;  . PARTIAL COLECTOMY N/A 01/29/2016   Procedure: PARTIAL COLECTOMY ;  Surgeon: Aviva Signs, MD;  Location: [facility] ORS;  Service: General;  Laterality: N/A;  . PORTACATH PLACEMENT Left 01/29/2016   Procedure: INSERTION PORT-A-CATH;  Surgeon: Aviva Signs, MD;  Location: [facility] ORS;  Service: General;  Laterality: Left;  left subclavian    Prior to Admission medications   Medication Sig Start Date End Date Taking? Authorizing Provider  benztropine (COGENTIN) 1 MG tablet Take 1 mg by mouth 2 (two) times daily.   Yes [provider]  clonazePAM (KLONOPIN) 0.5 MG tablet Take 0.5 mg by  mouth 2 (two) times daily.    Yes [provider]  divalproex (DEPAKOTE ER) 250 MG 24 hr tablet Take 250 mg by mouth at bedtime.   Yes [provider]  docusate sodium (COLACE) 100 MG capsule Take 100 mg by mouth daily as needed for mild constipation.   Yes [provider]  famotidine (PEPCID) 20 MG tablet Take 20 mg by mouth daily.   Yes [provider]  fluPHENAZine (PROLIXIN) 5 MG tablet Take 5 mg by mouth 3 (three) times daily.    Yes [provider]  loratadine (CLARITIN) 10 MG tablet Take 10 mg by mouth daily.   Yes [provider]  metFORMIN (GLUCOPHAGE) 500 MG tablet Take 500 mg by mouth 2 (two) times daily with a meal.    Yes [provider]  NON FORMULARY Take 1 capsule by mouth 2 (two) times daily. Fiber capsules 520 mg.   Yes [provider]  simvastatin (ZOCOR) 20 MG tablet Take 20 mg by mouth daily.   Yes [provider]    Allergies as of 09/28/2018  . (No Known Allergies)    Family History  Problem Relation Age of Onset  . Leukemia Paternal Grandfather   . Leukemia Cousin   . Colon cancer Neg Hx     Social History   Socioeconomic History  . Marital status: Single    Spouse name: Not on file  . Number of children: Not on file  . Years of education: Not on file  .  Highest education level: Not on file  Occupational History  . Not on file  Social Needs  . Financial resource strain: Not on file  . Food insecurity:    Worry: Not on file    Inability: Not on file  . Transportation needs:    Medical: Not on file    Non-medical: Not on file  Tobacco Use  . Smoking status: Current Every Day Smoker    Packs/day: 0.50    Years: 19.00    Pack years: 9.50    Types: Cigarettes  . Smokeless tobacco: Never Used  . Tobacco comment: 3 cigs daily  Substance and Sexual Activity  . Alcohol use: No    Alcohol/week: 0.0 standard drinks  . Drug use: No  . Sexual activity: Never  Lifestyle  .  Physical activity:    Days per week: Not on file    Minutes per session: Not on file  . Stress: Not on file  Relationships  . Social connections:    Talks on phone: Not on file    Gets together: Not on file    Attends religious service: Not on file    Active member of club or organization: Not on file    Attends meetings of clubs or organizations: Not on file    Relationship status: Not on file  . Intimate partner violence:    Fear of current or ex partner: Not on file    Emotionally abused: Not on file    Physically abused: Not on file    Forced sexual activity: Not on file  Other Topics Concern  . Not on file  Social History Narrative  . Not on file    Review of Systems: See HPI, otherwise negative ROS   Physical Exam: BP 120/62   Pulse (!) 47   Temp 98.6 F (37 C) (Oral)   Resp 20   Ht 5\' 4"  (1.626 m)   Wt 76 kg   SpO2 98%   BMI 28.76 kg/m  General:   Alert,  pleasant and cooperative in NAD Head:  Normocephalic and atraumatic. Neck:  Supple; Lungs:  Clear throughout to auscultation.    Heart:  Regular rate and rhythm. Abdomen:  Soft, nontender and nondistended. Normal bowel sounds, without guarding, and without rebound.   Neurologic:  Alert and  oriented x4;  grossly normal neurologically.  Impression/Plan:     PERSONAL HISTORY OF colon cancer  PLAN: 1. TCS TODAY. DISCUSSED PROCEDURE, BENEFITS, & RISKS: < 1% chance of medication reaction, bleeding, perforation, or rupture of spleen/liver.

## 2018-10-01 NOTE — Progress Notes (Signed)
PROGRESS NOTE  Mary Wells GYI:[id number] DOB: 1956/05/08 DOA: 09/30/2018 PCP: Rosita Fire, MD  Brief History:   63 y.o. female with medical history of adenocarcinoma of the colon, diabetes mellitus type 2, iron deficiency anemia, and schizophrenia presenting with rectal mass and scheduled colonoscopy.  The patient was originally diagnosed with stage IIb adenocarcinoma of the colon in June 2017.  She underwent a right hemicolectomy with resection of to the terminal ileum on 01/29/2016.  Liver biopsy was benign at that time.  She has not had a surveillance colonoscopy.  The patient had followed up with med/onc, Dr. Walden Field.  She had a surveillance CT of the abdomen and pelvis on 02/12/2018 which showed a possible rectal mass.  She was subsequently referred to GI for colonoscopy.  However the patient has been poorly compliant with follow-up appointments. She resides [facility].  Sometimes feels a little nauseated every now and then "not much, just a tiny bit" without emesis. BM every day. No rectal bleeding. She has Good appetite.  Because the patient has had poor compliance with clear liquids and difficulty with preps, the patient was admitted for observation for her colonoscopy.  Notably, the patient's iron has been held since 09/24/2018.  Assessment/Plan: Adenocarcinoma of the colon -02/12/2018 CT abdomen pelvis shows possible rectal mass without any other signs of metastasis -Planning for colonoscopy 10/01/2018 -GI consulted -09/24/2018 CT abdomen/pelvis--stable postsurgical changes from R-hemicolectomy without any evidence of anastomotic tumor recurrence; low-lying [name] in the uterine cavity; bladder wall thickening  Pulmonary nodule -Has been stable on serial CT scans -Follow-up with Dr. Walden Field  Diabetes mellitus type 2 -Holding metformin -NovoLog sliding scale -Hemoglobin A1c  Hyperlipidemia -Restart statin once able to tolerate p.o.  Schizophrenia -Continue home  doses of Cogentin, Klonopin, Depakote, Prolixin   Disposition Plan:   Discharge home 2/27 if stable Family Communication:   No Family at bedside  Consultants:  GI  Code Status:  FULL   DVT Prophylaxis:  Low risk   Procedures: As Listed in Progress Note Above       Subjective: Patient denies fevers, chills, headache, chest pain, dyspnea, nausea, vomiting, diarrhea, abdominal pain, dysuria, hematuria, hematochezia, and melena.   Objective: Vitals:   09/30/18 2349 10/01/18 0607  BP: (!) 144/66 111/69  Pulse: (!) 51 (!) 49  Resp: 20 18  Temp: (!) 97.4 F (36.3 C) (!) 97.5 F (36.4 C)  TempSrc: Oral Oral  SpO2: 100% 100%    Intake/Output Summary (Last 24 hours) at 10/01/2018 3500 Last data filed at 09/30/2018 2300 Gross per 24 hour  Intake 480 ml  Output -  Net 480 ml   Weight change:  Exam:   General:  Pt is alert, follows commands appropriately, not in acute distress  HEENT: No icterus, No thrush, No neck mass, [place]/AT  Cardiovascular: RRR, S1/S2, no rubs, no gallops  Respiratory: CTA bilaterally, no wheezing, no crackles, no rhonchi  Abdomen: Soft/+BS, non tender, non distended, no guarding  Extremities: No edema, No lymphangitis, No petechiae, No rashes, no synovitis   Data Reviewed: I have personally reviewed following labs and imaging studies Basic Metabolic Panel: Recent Labs  Lab 09/30/18 1255  NA 140  K 3.8  CL 107  CO2 24  GLUCOSE 123*  BUN 9  CREATININE 0.78  CALCIUM 9.4   Liver Function Tests: No results for input(s): AST, ALT, ALKPHOS, BILITOT, PROT, ALBUMIN in the last 168 hours. No results for input(s):  LIPASE, AMYLASE in the last 168 hours. No results for input(s): AMMONIA in the last 168 hours. Coagulation Profile: No results for input(s): INR, PROTIME in the last 168 hours. CBC: Recent Labs  Lab 09/30/18 1255  WBC 4.6  HGB 12.4  HCT 42.8  MCV 82.5  PLT 190   Cardiac Enzymes: No results for input(s): CKTOTAL, CKMB,  CKMBINDEX, TROPONINI in the last 168 hours. BNP: Invalid input(s): POCBNP CBG: Recent Labs  Lab 09/30/18 1139 09/30/18 1240 09/30/18 1759 10/01/18 0852  GLUCAP 47* 95 82 83   HbA1C: Recent Labs    09/30/18 1255  HGBA1C 6.4*   Urine analysis:    Component Value Date/Time   COLORURINE YELLOW 01/27/2016 0556   APPEARANCEUR HAZY (A) 01/27/2016 0556   LABSPEC <1.005 (L) 01/27/2016 0556   PHURINE 7.0 01/27/2016 0556   GLUCOSEU NEGATIVE 01/27/2016 0556   HGBUR TRACE (A) 01/27/2016 0556   BILIRUBINUR NEGATIVE 01/27/2016 0556   KETONESUR NEGATIVE 01/27/2016 0556   PROTEINUR NEGATIVE 01/27/2016 0556   NITRITE NEGATIVE 01/27/2016 0556   LEUKOCYTESUR LARGE (A) 01/27/2016 0556   Sepsis Labs: @LABRCNTIP (procalcitonin:4,lacticidven:4) )No results found for this or any previous visit (from the past 240 hour(s)).   Scheduled Meds: . benztropine  1 mg Oral BID  . clonazePAM  0.5 mg Oral BID  . divalproex  250 mg Oral QHS  . famotidine  20 mg Oral Daily  . fluPHENAZine  5 mg Oral BID  . insulin aspart  0-9 Units Subcutaneous TID WC  . loratadine  10 mg Oral Daily  . simvastatin  20 mg Oral q1800   Continuous Infusions:  Procedures/Studies: Ct Chest W Contrast  Result Date: 09/24/2018 CLINICAL DATA:  Stage IIC colonic adenocarcinoma status post right hemicolectomy in June 2017. Restaging. EXAM: CT CHEST, ABDOMEN, AND PELVIS WITH CONTRAST TECHNIQUE: Multidetector CT imaging of the chest, abdomen and pelvis was performed following the standard protocol during bolus administration of intravenous contrast. CONTRAST:  159mL OMNIPAQUE IOHEXOL 300 MG/ML  SOLN COMPARISON:  02/12/2018 CT chest, abdomen and pelvis. FINDINGS: CT CHEST FINDINGS Cardiovascular: Top-normal heart size, stable. No significant pericardial effusion/thickening. Coronary atherosclerosis. Left subclavian Port-A-Cath terminates in the middle third of the SVC. Atherosclerotic nonaneurysmal thoracic aorta. Stable dilated  main pulmonary artery (3.5 cm diameter). No central pulmonary emboli. Mediastinum/Nodes: Subcentimeter hypodense left thyroid nodules are stable. Unremarkable esophagus. No pathologically enlarged axillary, mediastinal or hilar lymph nodes. Lungs/Pleura: No pneumothorax. No pleural effusion. No acute consolidative airspace disease or lung masses. Posterior right upper lobe 1.0 cm solid pulmonary nodule (series 4/image 40) is stable since 01/26/2016 chest CT, most compatible with a benign nodule. No new significant pulmonary nodules. Musculoskeletal:  No aggressive appearing focal osseous lesions. CT ABDOMEN PELVIS FINDINGS Hepatobiliary: Normal liver size. Subcentimeter hypodense inferior right liver lobe lesion is too small to characterize and is unchanged since 01/26/2016 CT, considered benign. No new liver lesions. Normal gallbladder with no radiopaque cholelithiasis. No biliary ductal dilatation. Pancreas: Normal, with no mass or duct dilation. Spleen: Normal size. No mass. Adrenals/Urinary Tract: Normal adrenals. 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 Stable mild fullness of the renal collecting systems without overt hydronephrosis. Symmetric normal contrast nephrograms. Numerous subcentimeter hypodense renal cortical lesions in both kidneys are too small to characterize and are not appreciably changed, considered benign. No new renal lesions. Questionable mild diffuse bladder wall thickening, possibly due to under distension. Stomach/Bowel: Normal non-distended stomach. Stable postsurgical changes from right hemicolectomy with ileocolic anastomosis in the right abdomen.  No small bowel dilatation or wall thickening. No anastomotic mass or wall thickening. Oral contrast transits to the splenic flexure of the colon. Moderate stool throughout the remnant large-bowel. No definite large bowel wall thickening, diverticulosis or significant pericolonic fat stranding.  Vascular/Lymphatic: Atherosclerotic nonaneurysmal abdominal aorta. Patent portal, splenic, hepatic and renal veins. No pathologically enlarged lymph nodes in the abdomen or pelvis. Reproductive: Intrauterine device appears low lying in the uterine cavity, unchanged. No adnexal masses. Other: No pneumoperitoneum, ascites or focal fluid collection. Musculoskeletal: No aggressive appearing focal osseous lesions. Mild lumbar spondylosis. IMPRESSION: 1. Stable postsurgical changes from right hemicolectomy with no evidence of anastomotic tumor recurrence. 2. No findings of metastatic disease. 3. Low lying [name] in the uterine cavity, unchanged. Suggest correlation with pelvic ultrasound. 4. Questionable mild diffuse bladder wall thickening, which may be due to under distention. Urinalysis correlation may be obtained as clinically warranted. 5.  Aortic Atherosclerosis (ICD10-I70.0). Electronically Signed   By: Ilona Sorrel M.D.   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 Restaging. EXAM: CT CHEST, ABDOMEN, AND PELVIS WITH CONTRAST TECHNIQUE: Multidetector CT imaging of the chest, abdomen and pelvis was performed following the standard protocol during bolus administration of intravenous contrast. CONTRAST:  155mL OMNIPAQUE IOHEXOL 300 MG/ML  SOLN COMPARISON:  02/12/2018 CT chest, abdomen and pelvis. FINDINGS: CT CHEST FINDINGS Cardiovascular: Top-normal heart size, stable. No significant pericardial effusion/thickening. Coronary atherosclerosis. Left subclavian Port-A-Cath terminates in the middle third of the SVC. Atherosclerotic nonaneurysmal thoracic aorta. Stable dilated main pulmonary artery (3.5 cm diameter). No central pulmonary emboli. Mediastinum/Nodes: Subcentimeter hypodense left thyroid nodules are stable. Unremarkable esophagus. No pathologically enlarged axillary, mediastinal or  hilar lymph nodes. Lungs/Pleura: No pneumothorax. No pleural effusion. No acute consolidative airspace disease or lung masses. Posterior right upper lobe 1.0 cm solid pulmonary nodule (series 4/image 40) is stable since 01/26/2016 chest CT, most compatible with a benign nodule. No new significant pulmonary nodules. Musculoskeletal:  No aggressive appearing focal osseous lesions. CT ABDOMEN PELVIS FINDINGS Hepatobiliary: Normal liver size. Subcentimeter hypodense inferior right liver lobe lesion is too small to characterize and is unchanged since 01/26/2016 CT, considered benign. No new liver lesions. Normal gallbladder with no radiopaque cholelithiasis. No biliary ductal dilatation. Pancreas: Normal, with no mass or duct dilation. Spleen: Normal size. No mass. Adrenals/Urinary Tract: Normal adrenals. 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 Stable mild fullness of the renal collecting systems without overt hydronephrosis. Symmetric normal contrast nephrograms. Numerous subcentimeter hypodense renal cortical lesions in both kidneys are too small to characterize and are not appreciably changed, considered benign. No new renal lesions. Questionable mild diffuse bladder wall thickening, possibly due to under distension. Stomach/Bowel: Normal non-distended stomach. Stable postsurgical changes from right hemicolectomy with ileocolic anastomosis in the right abdomen. No small bowel dilatation or wall thickening. No anastomotic mass or wall thickening. Oral contrast transits to the splenic flexure of the colon. Moderate stool throughout the remnant large-bowel. No definite large bowel wall thickening, diverticulosis or significant pericolonic fat stranding. Vascular/Lymphatic: Atherosclerotic nonaneurysmal abdominal aorta. Patent portal, splenic, hepatic and renal veins. No pathologically enlarged lymph nodes in the abdomen or pelvis. Reproductive: Intrauterine device appears low lying in  the uterine cavity, unchanged. No adnexal masses. Other: No pneumoperitoneum, ascites or focal fluid collection. Musculoskeletal: No aggressive appearing focal osseous lesions. Mild lumbar spondylosis. IMPRESSION: 1. Stable postsurgical changes from right hemicolectomy with  no evidence of anastomotic tumor recurrence. 2. No findings of metastatic disease. 3. Low lying [name] in the uterine cavity, unchanged. Suggest correlation with pelvic ultrasound. 4. Questionable mild diffuse bladder wall thickening, which may be due to under distention. Urinalysis correlation may be obtained as clinically warranted. 5.  Aortic Atherosclerosis (ICD10-I70.0). Electronically Signed   By: Ilona Sorrel M.D.   On: 09/24/2018 14:19   Mm Digital Diagnostic Unilat R  Result Date: 09/08/2018 CLINICAL DATA:  Screening recall for right breast calcifications. EXAM: DIGITAL DIAGNOSTIC RIGHT MAMMOGRAM WITH CAD COMPARISON:  Previous exam(s). ACR Breast Density Category c: The breast tissue is heterogeneously dense, which may obscure small masses. FINDINGS: Additional magnification views were performed of the right breast. There are 2 groups of of predominantly round and punctate calcifications in the lower outer right breast, with the smaller group in the lower central to slightly outer right breast measuring 6 mm and the additional slightly more superior located group measuring 1.7 cm. This smaller group of calcifications may layer on the spot compression magnification ML view, however this wasdifficult to definitively determine with this appearance possibly related to patient motion. Mammographic images were processed with CAD. IMPRESSION: Indeterminate right breast calcifications. RECOMMENDATION: Recommend stereotactic guided biopsy of the 2 adjacent groups of calcifications in the right breast. I have discussed the findings and recommendations with the patient. Results were also provided in writing at the conclusion of the visit. If  applicable, a reminder letter will be sent to the patient regarding the next appointment. BI-RADS CATEGORY  4: Suspicious. Electronically Signed   By: Everlean Alstrom M.D.   On: 09/08/2018 11:09   Mm Clip Placement Right  Result Date: 09/23/2018 CLINICAL DATA:  Status post stereotactic biopsies of the right breast. EXAM: DIAGNOSTIC RIGHT MAMMOGRAM POST STEREOTACTIC BIOPSIES COMPARISON:  Previous exam(s). FINDINGS: Mammographic images were obtained following stereotactic guided biopsies of the right breast. Mammographic images show there coil shaped and X shaped clips in the lower outer quadrant of the right breast in appropriate position. IMPRESSION: Status post stereotactic biopsies of the right breast with pathology pending. Final Assessment: Post Procedure Mammograms for Marker Placement Electronically Signed   By: Lillia Mountain M.D.   On: 09/23/2018 11:35   Mm Rt Breast Bx W Loc Dev 1st Lesion Image Bx Spec Stereo Guide  Addendum Date: 09/24/2018   ADDENDUM REPORT: 09/24/2018 13:44 ADDENDUM: Pathology revealed FIBROCYSTIC CHANGES WITH CALCIFICATIONS of the RIGHT breast, lower outer quadrant, both sites, coil clip and X clip. This was found to be concordant by Dr. Lillia Mountain. Pathology results were discussed with Laure Kidney ([place]) from [facility] by telephone, per request .She reported the patient doing well after the biopsy with tenderness at the site. Post biopsy instructions and care were reviewed and questions were answered. She was encouraged to call [facility] for any additional concerns. The patient was instructed to return for annual screening mammography and informed a reminder notice would be sent regarding this appointment. Pathology results reported by Stacie Acres, RN on 09/24/2018. Electronically Signed   By: Lillia Mountain M.D.   On: 09/24/2018 13:44   Result Date: 09/24/2018 CLINICAL DATA:  Two areas of suspicious calcifications in the  right breast. EXAM: RIGHT BREAST STEREOTACTIC CORE NEEDLE BIOPSIES COMPARISON:  Previous exams. FINDINGS: The patient and I discussed the procedure of stereotactic-guided biopsy including benefits and alternatives. We discussed the high likelihood of a successful procedure. We discussed the risks of the  procedure including infection, bleeding, tissue injury, clip migration, and inadequate sampling. Informed written consent was given. The usual time out protocol was performed immediately prior to the procedure. Using sterile technique and 1% lidocaine 1% lidocaine with epinephrine as local anesthetic, under stereotactic guidance, a 9 gauge vacuum assisted device was used to perform core needle biopsy of calcifications in the lower outer quadrant of the right breast using a lateral to medial approach. Specimen radiograph was performed showing calcifications are present in the tissue samples. Specimens with calcifications are identified for pathology. Lesion quadrant: Lower outer quadrant At the conclusion of the procedure, a coil shaped tissue marker clip was deployed into the biopsy cavity. Follow-up 2-view mammogram was performed and dictated separately. The patient and I discussed the procedure of stereotactic-guided biopsy including benefits and alternatives. We discussed the high likelihood of a successful procedure. We discussed the risks of the procedure including infection, bleeding, tissue injury, clip migration, and inadequate sampling. Informed written consent was given. The usual time out protocol was performed immediately prior to the procedure. Using sterile technique and 1% lidocaine 1% lidocaine with epinephrine as local anesthetic, under stereotactic guidance, a 9 gauge vacuum assisted device was used to perform core needle biopsy of calcifications in the lower outer quadrant of the right breast using a lateral to medial approach. Specimen radiograph was performed showing calcifications are present in  the tissue sample. Specimens with calcifications are identified for pathology. Lesion quadrant: Lower outer quadrant At the conclusion of the procedure, a X shaped tissue marker clip was deployed into the biopsy cavity. Follow-up 2-view mammogram was performed and dictated separately. IMPRESSION: Stereotactic-guided biopsies of the right breast. No apparent complications. Electronically Signed: By: Lillia Mountain M.D. On: 09/23/2018 11:21   Mm Rt Breast Bx W Loc Dev Ea Ad Lesion Img Bx Spec Stereo Guide  Addendum Date: 09/24/2018   ADDENDUM REPORT: 09/24/2018 13:44 ADDENDUM: Pathology revealed FIBROCYSTIC CHANGES WITH CALCIFICATIONS of the RIGHT breast, lower outer quadrant, both sites, coil clip and X clip. This was found to be concordant by Dr. Lillia Mountain. Pathology results were discussed with Laure Kidney ([place]) from [facility] by telephone, per request .She reported the patient doing well after the biopsy with tenderness at the site. Post biopsy instructions and care were reviewed and questions were answered. She was encouraged to call [facility] for any additional concerns. The patient was instructed to return for annual screening mammography and informed a reminder notice would be sent regarding this appointment. Pathology results reported by Stacie Acres, RN on 09/24/2018. Electronically Signed   By: Lillia Mountain M.D.   On: 09/24/2018 13:44   Result Date: 09/24/2018 CLINICAL DATA:  Two areas of suspicious calcifications in the right breast. EXAM: RIGHT BREAST STEREOTACTIC CORE NEEDLE BIOPSIES COMPARISON:  Previous exams. FINDINGS: The patient and I discussed the procedure of stereotactic-guided biopsy including benefits and alternatives. We discussed the high likelihood of a successful procedure. We discussed the risks of the procedure including infection, bleeding, tissue injury, clip migration, and inadequate sampling. Informed written consent was  given. The usual time out protocol was performed immediately prior to the procedure. Using sterile technique and 1% lidocaine 1% lidocaine with epinephrine as local anesthetic, under stereotactic guidance, a 9 gauge vacuum assisted device was used to perform core needle biopsy of calcifications in the lower outer quadrant of the right breast using a lateral to medial approach. Specimen radiograph was performed showing calcifications are present  in the tissue samples. Specimens with calcifications are identified for pathology. Lesion quadrant: Lower outer quadrant At the conclusion of the procedure, a coil shaped tissue marker clip was deployed into the biopsy cavity. Follow-up 2-view mammogram was performed and dictated separately. The patient and I discussed the procedure of stereotactic-guided biopsy including benefits and alternatives. We discussed the high likelihood of a successful procedure. We discussed the risks of the procedure including infection, bleeding, tissue injury, clip migration, and inadequate sampling. Informed written consent was given. The usual time out protocol was performed immediately prior to the procedure. Using sterile technique and 1% lidocaine 1% lidocaine with epinephrine as local anesthetic, under stereotactic guidance, a 9 gauge vacuum assisted device was used to perform core needle biopsy of calcifications in the lower outer quadrant of the right breast using a lateral to medial approach. Specimen radiograph was performed showing calcifications are present in the tissue sample. Specimens with calcifications are identified for pathology. Lesion quadrant: Lower outer quadrant At the conclusion of the procedure, a X shaped tissue marker clip was deployed into the biopsy cavity. Follow-up 2-view mammogram was performed and dictated separately. IMPRESSION: Stereotactic-guided biopsies of the right breast. No apparent complications. Electronically Signed: By: Lillia Mountain M.D. On:  09/23/2018 11:21    Orson Eva, DO  [facility] Pager [phone]  If 7PM-7AM, please contact night-coverage [email or web address] Password [email or web address] 10/01/2018, 9:24 AM   LOS: 1 day

## 2018-10-01 NOTE — Discharge Summary (Signed)
Physician Discharge Summary  Mary Wells GXQ:[id number] DOB: 1956-07-03 DOA: 09/30/2018  PCP: Rosita Fire, MD  Admit date: 09/30/2018 Discharge date: 10/01/2018  Admitted From: ALF Disposition:  ALF  Recommendations for Outpatient Follow-up:  1. Follow up with PCP in 1-2 weeks 2. Please obtain BMP/CBC in one week   Discharge Condition: Stable CODE STATUS: FULL Diet recommendation: Heart Healthy   Brief/Interim Summary: 63 y.o.femalewith medical history ofadenocarcinoma of the colon, diabetes mellitus type 2, iron deficiency anemia, and schizophrenia presenting with rectal mass and scheduled colonoscopy. The patient was originally diagnosed with stage IIb adenocarcinoma of the colon in June 2017. She underwent a right hemicolectomy with resection of to the terminal ileum on 01/29/2016. Liver biopsy was benign at that time. She has not had a surveillance colonoscopy. The patient had followed up with med/onc, Dr. Walden Field.She had a surveillance CT of the abdomen and pelvis on 02/12/2018 which showed a possible rectal mass. She was subsequently referred to GI for colonoscopy. However the patient has been poorly compliant with follow-up appointments. [place].  Sometimes feels a little nauseated every now and then "not much, just a tiny bit" without emesis. BM every day. No rectal bleeding.She hasGood appetite.Because the patient has had poor compliance with clear liquids and difficulty with preps, the patient was admitted for observation for her colonoscopy. Notably, the patient's iron has been held since 09/24/2018.  Colonoscopy was performed on 10/01/18 which did not show a rectal mass.  Discharge Diagnoses:  Adenocarcinoma of the colon -02/12/2018 CT abdomen pelvis shows possible rectal mass without any other signs of metastasis -colonoscopy 10/01/2018--internal and external hemorrhoids, no rectal mass -GI consulted appreciated -09/24/2018 CT  abdomen/pelvis--stable postsurgical changesfrom R-hemicolectomy without any evidence of anastomotic tumor recurrence;low-lying [name] in the uterine cavity;bladder wall thickening  Pulmonary nodule -Has been stable on serial CT scans -Follow-up with Dr. Walden Field  Diabetes mellitus type 2 -Holding metformin--restart after d/c -NovoLog sliding scale -Hemoglobin A1c--6.4  Hyperlipidemia -Restart statin   Schizophrenia -Continue home doses of Cogentin, Klonopin, Depakote -no longer takes fluphenazine   Discharge Instructions   Allergies as of 10/01/2018   No Known Allergies     Medication List    STOP taking these medications   fluPHENAZine 5 MG tablet Commonly known as:  PROLIXIN     TAKE these medications   benztropine 1 MG tablet Commonly known as:  COGENTIN Take 1 mg by mouth 2 (two) times daily.   clonazePAM 0.5 MG tablet Commonly known as:  KLONOPIN Take 0.5 mg by mouth 2 (two) times daily.   DEPAKOTE ER 250 MG 24 hr tablet Generic drug:  divalproex Take 250 mg by mouth at bedtime.   docusate sodium 100 MG capsule Commonly known as:  COLACE Take 100 mg by mouth daily as needed for mild constipation.   famotidine 20 MG tablet Commonly known as:  PEPCID Take 20 mg by mouth daily.   loratadine 10 MG tablet Commonly known as:  CLARITIN Take 10 mg by mouth daily.   metFORMIN 500 MG tablet Commonly known as:  GLUCOPHAGE Take 500 mg by mouth 2 (two) times daily with a meal.   NON FORMULARY Take 1 capsule by mouth 2 (two) times daily. Fiber capsules 520 mg.   simvastatin 20 MG tablet Commonly known as:  ZOCOR Take 20 mg by mouth daily.      Contact information for after-discharge care    [facility] .   Service:  Group Home Contact information: [place] [phone]             No Known Allergies  Consultations:  GI   Procedures/Studies: Ct Chest W  Contrast  Result Date: 09/24/2018 CLINICAL DATA:  Stage IIC colonic adenocarcinoma status post right hemicolectomy in June 2017. Restaging. EXAM: CT CHEST, ABDOMEN, AND PELVIS WITH CONTRAST TECHNIQUE: Multidetector CT imaging of the chest, abdomen and pelvis was performed following the standard protocol during bolus administration of intravenous contrast. CONTRAST:  145mL OMNIPAQUE IOHEXOL 300 MG/ML  SOLN COMPARISON:  02/12/2018 CT chest, abdomen and pelvis. FINDINGS: CT CHEST FINDINGS Cardiovascular: Top-normal heart size, stable. No significant pericardial effusion/thickening. Coronary atherosclerosis. Left subclavian Port-A-Cath terminates in the middle third of the SVC. Atherosclerotic nonaneurysmal thoracic aorta. Stable dilated main pulmonary artery (3.5 cm diameter). No central pulmonary emboli. Mediastinum/Nodes: Subcentimeter hypodense left thyroid nodules are stable. Unremarkable esophagus. No pathologically enlarged axillary, mediastinal or hilar lymph nodes. Lungs/Pleura: No pneumothorax. No pleural effusion. No acute consolidative airspace disease or lung masses. Posterior right upper lobe 1.0 cm solid pulmonary nodule (series 4/image 40) is stable since 01/26/2016 chest CT, most compatible with a benign nodule. No new significant pulmonary nodules. Musculoskeletal:  No aggressive appearing focal osseous lesions. CT ABDOMEN PELVIS FINDINGS Hepatobiliary: Normal liver size. Subcentimeter hypodense inferior right liver lobe lesion is too small to characterize and is unchanged since 01/26/2016 CT, considered benign. No new liver lesions. Normal gallbladder with no radiopaque cholelithiasis. No biliary ductal dilatation. Pancreas: Normal, with no mass or duct dilation. Spleen: Normal size. No mass. Adrenals/Urinary Tract: Normal adrenals. 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 Stable mild fullness of the renal collecting systems without overt hydronephrosis. Symmetric  normal contrast nephrograms. Numerous subcentimeter hypodense renal cortical lesions in both kidneys are too small to characterize and are not appreciably changed, considered benign. No new renal lesions. Questionable mild diffuse bladder wall thickening, possibly due to under distension. Stomach/Bowel: Normal non-distended stomach. Stable postsurgical changes from right hemicolectomy with ileocolic anastomosis in the right abdomen. No small bowel dilatation or wall thickening. No anastomotic mass or wall thickening. Oral contrast transits to the splenic flexure of the colon. Moderate stool throughout the remnant large-bowel. No definite large bowel wall thickening, diverticulosis or significant pericolonic fat stranding. Vascular/Lymphatic: Atherosclerotic nonaneurysmal abdominal aorta. Patent portal, splenic, hepatic and renal veins. No pathologically enlarged lymph nodes in the abdomen or pelvis. Reproductive: Intrauterine device appears low lying in the uterine cavity, unchanged. No adnexal masses. Other: No pneumoperitoneum, ascites or focal fluid collection. Musculoskeletal: No aggressive appearing focal osseous lesions. Mild lumbar spondylosis. IMPRESSION: 1. Stable postsurgical changes from right hemicolectomy with no evidence of anastomotic tumor recurrence. 2. No findings of metastatic disease. 3. Low lying [name] in the uterine cavity, unchanged. Suggest correlation with pelvic ultrasound. 4. Questionable mild diffuse bladder wall thickening, which may be due to under distention. Urinalysis correlation may be obtained as clinically warranted. 5.  Aortic Atherosclerosis (ICD10-I70.0). Electronically Signed   By: Ilona Sorrel M.D.   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 Restaging. EXAM: CT CHEST, ABDOMEN, AND PELVIS WITH CONTRAST TECHNIQUE: Multidetector CT imaging of the  chest, abdomen and pelvis was performed following the standard protocol during bolus administration of intravenous contrast. CONTRAST:  111mL OMNIPAQUE IOHEXOL 300 MG/ML  SOLN COMPARISON:  02/12/2018 CT chest, abdomen and pelvis.  FINDINGS: CT CHEST FINDINGS Cardiovascular: Top-normal heart size, stable. No significant pericardial effusion/thickening. Coronary atherosclerosis. Left subclavian Port-A-Cath terminates in the middle third of the SVC. Atherosclerotic nonaneurysmal thoracic aorta. Stable dilated main pulmonary artery (3.5 cm diameter). No central pulmonary emboli. Mediastinum/Nodes: Subcentimeter hypodense left thyroid nodules are stable. Unremarkable esophagus. No pathologically enlarged axillary, mediastinal or hilar lymph nodes. Lungs/Pleura: No pneumothorax. No pleural effusion. No acute consolidative airspace disease or lung masses. Posterior right upper lobe 1.0 cm solid pulmonary nodule (series 4/image 40) is stable since 01/26/2016 chest CT, most compatible with a benign nodule. No new significant pulmonary nodules. Musculoskeletal:  No aggressive appearing focal osseous lesions. CT ABDOMEN PELVIS FINDINGS Hepatobiliary: Normal liver size. Subcentimeter hypodense inferior right liver lobe lesion is too small to characterize and is unchanged since 01/26/2016 CT, considered benign. No new liver lesions. Normal gallbladder with no radiopaque cholelithiasis. No biliary ductal dilatation. Pancreas: Normal, with no mass or duct dilation. Spleen: Normal size. No mass. Adrenals/Urinary Tract: Normal adrenals. 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 Stable mild fullness of the renal collecting systems without overt hydronephrosis. Symmetric normal contrast nephrograms. Numerous subcentimeter hypodense renal cortical lesions in both kidneys are too small to characterize and are not appreciably changed, considered benign. No new renal lesions. Questionable mild diffuse  bladder wall thickening, possibly due to under distension. Stomach/Bowel: Normal non-distended stomach. Stable postsurgical changes from right hemicolectomy with ileocolic anastomosis in the right abdomen. No small bowel dilatation or wall thickening. No anastomotic mass or wall thickening. Oral contrast transits to the splenic flexure of the colon. Moderate stool throughout the remnant large-bowel. No definite large bowel wall thickening, diverticulosis or significant pericolonic fat stranding. Vascular/Lymphatic: Atherosclerotic nonaneurysmal abdominal aorta. Patent portal, splenic, hepatic and renal veins. No pathologically enlarged lymph nodes in the abdomen or pelvis. Reproductive: Intrauterine device appears low lying in the uterine cavity, unchanged. No adnexal masses. Other: No pneumoperitoneum, ascites or focal fluid collection. Musculoskeletal: No aggressive appearing focal osseous lesions. Mild lumbar spondylosis. IMPRESSION: 1. Stable postsurgical changes from right hemicolectomy with no evidence of anastomotic tumor recurrence. 2. No findings of metastatic disease. 3. Low lying [name] in the uterine cavity, unchanged. Suggest correlation with pelvic ultrasound. 4. Questionable mild diffuse bladder wall thickening, which may be due to under distention. Urinalysis correlation may be obtained as clinically warranted. 5.  Aortic Atherosclerosis (ICD10-I70.0). Electronically Signed   By: Ilona Sorrel M.D.   On: 09/24/2018 14:19   Mm Digital Diagnostic Unilat R  Result Date: 09/08/2018 CLINICAL DATA:  Screening recall for right breast calcifications. EXAM: DIGITAL DIAGNOSTIC RIGHT MAMMOGRAM WITH CAD COMPARISON:  Previous exam(s). ACR Breast Density Category c: The breast tissue is heterogeneously dense, which may obscure small masses. FINDINGS: Additional magnification views were performed of the right breast. There are 2 groups of of predominantly round and punctate calcifications in the lower outer right  breast, with the smaller group in the lower central to slightly outer right breast measuring 6 mm and the additional slightly more superior located group measuring 1.7 cm. This smaller group of calcifications may layer on the spot compression magnification ML view, however this wasdifficult to definitively determine with this appearance possibly related to patient motion. Mammographic images were processed with CAD. IMPRESSION: Indeterminate right breast calcifications. RECOMMENDATION: Recommend stereotactic guided biopsy of the 2 adjacent groups of calcifications in the right breast. I have discussed the findings and recommendations with the patient. Results were also provided in writing at the conclusion of the visit.  If applicable, a reminder letter will be sent to the patient regarding the next appointment. BI-RADS CATEGORY  4: Suspicious. Electronically Signed   By: Everlean Alstrom M.D.   On: 09/08/2018 11:09   Mm Clip Placement Right  Result Date: 09/23/2018 CLINICAL DATA:  Status post stereotactic biopsies of the right breast. EXAM: DIAGNOSTIC RIGHT MAMMOGRAM POST STEREOTACTIC BIOPSIES COMPARISON:  Previous exam(s). FINDINGS: Mammographic images were obtained following stereotactic guided biopsies of the right breast. Mammographic images show there coil shaped and X shaped clips in the lower outer quadrant of the right breast in appropriate position. IMPRESSION: Status post stereotactic biopsies of the right breast with pathology pending. Final Assessment: Post Procedure Mammograms for Marker Placement Electronically Signed   By: Lillia Mountain M.D.   On: 09/23/2018 11:35   Mm Rt Breast Bx W Loc Dev 1st Lesion Image Bx Spec Stereo Guide  Addendum Date: 09/24/2018   ADDENDUM REPORT: 09/24/2018 13:44 ADDENDUM: Pathology revealed FIBROCYSTIC CHANGES WITH CALCIFICATIONS of the RIGHT breast, lower outer quadrant, both sites, coil clip and X clip. This was found to be concordant by Dr. Lillia Mountain. Pathology  results were discussed with Laure Kidney ([place]) from [facility] by telephone, per request .She reported the patient doing well after the biopsy with tenderness at the site. Post biopsy instructions and care were reviewed and questions were answered. She was encouraged to call [facility] for any additional concerns. The patient was instructed to return for annual screening mammography and informed a reminder notice would be sent regarding this appointment. Pathology results reported by Stacie Acres, RN on 09/24/2018. Electronically Signed   By: Lillia Mountain M.D.   On: 09/24/2018 13:44   Result Date: 09/24/2018 CLINICAL DATA:  Two areas of suspicious calcifications in the right breast. EXAM: RIGHT BREAST STEREOTACTIC CORE NEEDLE BIOPSIES COMPARISON:  Previous exams. FINDINGS: The patient and I discussed the procedure of stereotactic-guided biopsy including benefits and alternatives. We discussed the high likelihood of a successful procedure. We discussed the risks of the procedure including infection, bleeding, tissue injury, clip migration, and inadequate sampling. Informed written consent was given. The usual time out protocol was performed immediately prior to the procedure. Using sterile technique and 1% lidocaine 1% lidocaine with epinephrine as local anesthetic, under stereotactic guidance, a 9 gauge vacuum assisted device was used to perform core needle biopsy of calcifications in the lower outer quadrant of the right breast using a lateral to medial approach. Specimen radiograph was performed showing calcifications are present in the tissue samples. Specimens with calcifications are identified for pathology. Lesion quadrant: Lower outer quadrant At the conclusion of the procedure, a coil shaped tissue marker clip was deployed into the biopsy cavity. Follow-up 2-view mammogram was performed and dictated separately. The patient and I discussed the procedure  of stereotactic-guided biopsy including benefits and alternatives. We discussed the high likelihood of a successful procedure. We discussed the risks of the procedure including infection, bleeding, tissue injury, clip migration, and inadequate sampling. Informed written consent was given. The usual time out protocol was performed immediately prior to the procedure. Using sterile technique and 1% lidocaine 1% lidocaine with epinephrine as local anesthetic, under stereotactic guidance, a 9 gauge vacuum assisted device was used to perform core needle biopsy of calcifications in the lower outer quadrant of the right breast using a lateral to medial approach. Specimen radiograph was performed showing calcifications are present in the tissue sample. Specimens with calcifications are identified for  pathology. Lesion quadrant: Lower outer quadrant At the conclusion of the procedure, a X shaped tissue marker clip was deployed into the biopsy cavity. Follow-up 2-view mammogram was performed and dictated separately. IMPRESSION: Stereotactic-guided biopsies of the right breast. No apparent complications. Electronically Signed: By: Lillia Mountain M.D. On: 09/23/2018 11:21   Mm Rt Breast Bx W Loc Dev Ea Ad Lesion Img Bx Spec Stereo Guide  Addendum Date: 09/24/2018   ADDENDUM REPORT: 09/24/2018 13:44 ADDENDUM: Pathology revealed FIBROCYSTIC CHANGES WITH CALCIFICATIONS of the RIGHT breast, lower outer quadrant, both sites, coil clip and X clip. This was found to be concordant by Dr. Lillia Mountain. Pathology results were discussed with Laure Kidney ([place]) from [facility] by telephone, per request .She reported the patient doing well after the biopsy with tenderness at the site. Post biopsy instructions and care were reviewed and questions were answered. She was encouraged to call [facility] for any additional concerns. The patient was instructed to return for annual screening  mammography and informed a reminder notice would be sent regarding this appointment. Pathology results reported by Stacie Acres, RN on 09/24/2018. Electronically Signed   By: Lillia Mountain M.D.   On: 09/24/2018 13:44   Result Date: 09/24/2018 CLINICAL DATA:  Two areas of suspicious calcifications in the right breast. EXAM: RIGHT BREAST STEREOTACTIC CORE NEEDLE BIOPSIES COMPARISON:  Previous exams. FINDINGS: The patient and I discussed the procedure of stereotactic-guided biopsy including benefits and alternatives. We discussed the high likelihood of a successful procedure. We discussed the risks of the procedure including infection, bleeding, tissue injury, clip migration, and inadequate sampling. Informed written consent was given. The usual time out protocol was performed immediately prior to the procedure. Using sterile technique and 1% lidocaine 1% lidocaine with epinephrine as local anesthetic, under stereotactic guidance, a 9 gauge vacuum assisted device was used to perform core needle biopsy of calcifications in the lower outer quadrant of the right breast using a lateral to medial approach. Specimen radiograph was performed showing calcifications are present in the tissue samples. Specimens with calcifications are identified for pathology. Lesion quadrant: Lower outer quadrant At the conclusion of the procedure, a coil shaped tissue marker clip was deployed into the biopsy cavity. Follow-up 2-view mammogram was performed and dictated separately. The patient and I discussed the procedure of stereotactic-guided biopsy including benefits and alternatives. We discussed the high likelihood of a successful procedure. We discussed the risks of the procedure including infection, bleeding, tissue injury, clip migration, and inadequate sampling. Informed written consent was given. The usual time out protocol was performed immediately prior to the procedure. Using sterile technique and 1% lidocaine 1% lidocaine with  epinephrine as local anesthetic, under stereotactic guidance, a 9 gauge vacuum assisted device was used to perform core needle biopsy of calcifications in the lower outer quadrant of the right breast using a lateral to medial approach. Specimen radiograph was performed showing calcifications are present in the tissue sample. Specimens with calcifications are identified for pathology. Lesion quadrant: Lower outer quadrant At the conclusion of the procedure, a X shaped tissue marker clip was deployed into the biopsy cavity. Follow-up 2-view mammogram was performed and dictated separately. IMPRESSION: Stereotactic-guided biopsies of the right breast. No apparent complications. Electronically Signed: By: Lillia Mountain M.D. On: 09/23/2018 11:21        Discharge Exam: Vitals:   10/01/18 1235 10/01/18 1410  BP: 120/62 (!) 136/91  Pulse: (!) 47 73  Resp: 20 15  Temp: 98.6 F (37 C) 97.7 F (36.5 C)  SpO2: 98% 100%   Vitals:   09/30/18 2349 10/01/18 0607 10/01/18 1235 10/01/18 1410  BP: (!) 144/66 111/69 120/62 (!) 136/91  Pulse: (!) 51 (!) 49 (!) 47 73  Resp: 20 18 20 15   Temp: (!) 97.4 F (36.3 C) (!) 97.5 F (36.4 C) 98.6 F (37 C) 97.7 F (36.5 C)  TempSrc: Oral Oral Oral   SpO2: 100% 100% 98% 100%  Weight:   76 kg   Height:   5\' 4"  (1.626 m)     General: Pt is alert, awake, not in acute distress Cardiovascular: RRR, S1/S2 +, no rubs, no gallops Respiratory: CTA bilaterally, no wheezing, no rhonchi Abdominal: Soft, NT, ND, bowel sounds + Extremities: no edema, no cyanosis   The results of significant diagnostics from this hospitalization (including imaging, microbiology, ancillary and laboratory) are listed below for reference.    Significant Diagnostic Studies: Ct Chest W Contrast  Result Date: 09/24/2018 CLINICAL DATA:  Stage IIC colonic adenocarcinoma status post right hemicolectomy in June 2017. Restaging. EXAM: CT CHEST, ABDOMEN, AND PELVIS WITH CONTRAST TECHNIQUE:  Multidetector CT imaging of the chest, abdomen and pelvis was performed following the standard protocol during bolus administration of intravenous contrast. CONTRAST:  118mL OMNIPAQUE IOHEXOL 300 MG/ML  SOLN COMPARISON:  02/12/2018 CT chest, abdomen and pelvis. FINDINGS: CT CHEST FINDINGS Cardiovascular: Top-normal heart size, stable. No significant pericardial effusion/thickening. Coronary atherosclerosis. Left subclavian Port-A-Cath terminates in the middle third of the SVC. Atherosclerotic nonaneurysmal thoracic aorta. Stable dilated main pulmonary artery (3.5 cm diameter). No central pulmonary emboli. Mediastinum/Nodes: Subcentimeter hypodense left thyroid nodules are stable. Unremarkable esophagus. No pathologically enlarged axillary, mediastinal or hilar lymph nodes. Lungs/Pleura: No pneumothorax. No pleural effusion. No acute consolidative airspace disease or lung masses. Posterior right upper lobe 1.0 cm solid pulmonary nodule (series 4/image 40) is stable since 01/26/2016 chest CT, most compatible with a benign nodule. No new significant pulmonary nodules. Musculoskeletal:  No aggressive appearing focal osseous lesions. CT ABDOMEN PELVIS FINDINGS Hepatobiliary: Normal liver size. Subcentimeter hypodense inferior right liver lobe lesion is too small to characterize and is unchanged since 01/26/2016 CT, considered benign. No new liver lesions. Normal gallbladder with no radiopaque cholelithiasis. No biliary ductal dilatation. Pancreas: Normal, with no mass or duct dilation. Spleen: Normal size. No mass. Adrenals/Urinary Tract: Normal adrenals. 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 Stable mild fullness of the renal collecting systems without overt hydronephrosis. Symmetric normal contrast nephrograms. Numerous subcentimeter hypodense renal cortical lesions in both kidneys are too small to characterize and are not appreciably changed, considered benign. No new renal lesions.  Questionable mild diffuse bladder wall thickening, possibly due to under distension. Stomach/Bowel: Normal non-distended stomach. Stable postsurgical changes from right hemicolectomy with ileocolic anastomosis in the right abdomen. No small bowel dilatation or wall thickening. No anastomotic mass or wall thickening. Oral contrast transits to the splenic flexure of the colon. Moderate stool throughout the remnant large-bowel. No definite large bowel wall thickening, diverticulosis or significant pericolonic fat stranding. Vascular/Lymphatic: Atherosclerotic nonaneurysmal abdominal aorta. Patent portal, splenic, hepatic and renal veins. No pathologically enlarged lymph nodes in the abdomen or pelvis. Reproductive: Intrauterine device appears low lying in the uterine cavity, unchanged. No adnexal masses. Other: No pneumoperitoneum, ascites or focal fluid collection. Musculoskeletal: No aggressive appearing focal osseous lesions. Mild lumbar spondylosis. IMPRESSION: 1. Stable postsurgical changes from right hemicolectomy with no evidence of anastomotic tumor recurrence. 2. No findings of metastatic disease.  3. Low lying [name] in the uterine cavity, unchanged. Suggest correlation with pelvic ultrasound. 4. Questionable mild diffuse bladder wall thickening, which may be due to under distention. Urinalysis correlation may be obtained as clinically warranted. 5.  Aortic Atherosclerosis (ICD10-I70.0). Electronically Signed   By: Ilona Sorrel M.D.   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 Restaging. EXAM: CT CHEST, ABDOMEN, AND PELVIS WITH CONTRAST TECHNIQUE: Multidetector CT imaging of the chest, abdomen and pelvis was performed following the standard protocol during bolus administration of intravenous contrast. CONTRAST:  131mL OMNIPAQUE IOHEXOL 300 MG/ML  SOLN COMPARISON:  02/12/2018 CT  chest, abdomen and pelvis. FINDINGS: CT CHEST FINDINGS Cardiovascular: Top-normal heart size, stable. No significant pericardial effusion/thickening. Coronary atherosclerosis. Left subclavian Port-A-Cath terminates in the middle third of the SVC. Atherosclerotic nonaneurysmal thoracic aorta. Stable dilated main pulmonary artery (3.5 cm diameter). No central pulmonary emboli. Mediastinum/Nodes: Subcentimeter hypodense left thyroid nodules are stable. Unremarkable esophagus. No pathologically enlarged axillary, mediastinal or hilar lymph nodes. Lungs/Pleura: No pneumothorax. No pleural effusion. No acute consolidative airspace disease or lung masses. Posterior right upper lobe 1.0 cm solid pulmonary nodule (series 4/image 40) is stable since 01/26/2016 chest CT, most compatible with a benign nodule. No new significant pulmonary nodules. Musculoskeletal:  No aggressive appearing focal osseous lesions. CT ABDOMEN PELVIS FINDINGS Hepatobiliary: Normal liver size. Subcentimeter hypodense inferior right liver lobe lesion is too small to characterize and is unchanged since 01/26/2016 CT, considered benign. No new liver lesions. Normal gallbladder with no radiopaque cholelithiasis. No biliary ductal dilatation. Pancreas: Normal, with no mass or duct dilation. Spleen: Normal size. No mass. Adrenals/Urinary Tract: Normal adrenals. 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 Stable mild fullness of the renal collecting systems without overt hydronephrosis. Symmetric normal contrast nephrograms. Numerous subcentimeter hypodense renal cortical lesions in both kidneys are too small to characterize and are not appreciably changed, considered benign. No new renal lesions. Questionable mild diffuse bladder wall thickening, possibly due to under distension. Stomach/Bowel: Normal non-distended stomach. Stable postsurgical changes from right hemicolectomy with ileocolic anastomosis in the right abdomen. No  small bowel dilatation or wall thickening. No anastomotic mass or wall thickening. Oral contrast transits to the splenic flexure of the colon. Moderate stool throughout the remnant large-bowel. No definite large bowel wall thickening, diverticulosis or significant pericolonic fat stranding. Vascular/Lymphatic: Atherosclerotic nonaneurysmal abdominal aorta. Patent portal, splenic, hepatic and renal veins. No pathologically enlarged lymph nodes in the abdomen or pelvis. Reproductive: Intrauterine device appears low lying in the uterine cavity, unchanged. No adnexal masses. Other: No pneumoperitoneum, ascites or focal fluid collection. Musculoskeletal: No aggressive appearing focal osseous lesions. Mild lumbar spondylosis. IMPRESSION: 1. Stable postsurgical changes from right hemicolectomy with no evidence of anastomotic tumor recurrence. 2. No findings of metastatic disease. 3. Low lying [name] in the uterine cavity, unchanged. Suggest correlation with pelvic ultrasound. 4. Questionable mild diffuse bladder wall thickening, which may be due to under distention. Urinalysis correlation may be obtained as clinically warranted. 5.  Aortic Atherosclerosis (ICD10-I70.0). Electronically Signed   By: Ilona Sorrel M.D.   On: 09/24/2018 14:19   Mm Digital Diagnostic Unilat R  Result Date: 09/08/2018 CLINICAL DATA:  Screening recall for right breast calcifications. EXAM: DIGITAL DIAGNOSTIC RIGHT MAMMOGRAM WITH CAD COMPARISON:  Previous exam(s). ACR Breast Density Category c: The breast tissue is heterogeneously dense, which may obscure small masses. FINDINGS: Additional magnification  views were performed of the right breast. There are 2 groups of of predominantly round and punctate calcifications in the lower outer right breast, with the smaller group in the lower central to slightly outer right breast measuring 6 mm and the additional slightly more superior located group measuring 1.7 cm. This smaller group of calcifications  may layer on the spot compression magnification ML view, however this wasdifficult to definitively determine with this appearance possibly related to patient motion. Mammographic images were processed with CAD. IMPRESSION: Indeterminate right breast calcifications. RECOMMENDATION: Recommend stereotactic guided biopsy of the 2 adjacent groups of calcifications in the right breast. I have discussed the findings and recommendations with the patient. Results were also provided in writing at the conclusion of the visit. If applicable, a reminder letter will be sent to the patient regarding the next appointment. BI-RADS CATEGORY  4: Suspicious. Electronically Signed   By: Everlean Alstrom M.D.   On: 09/08/2018 11:09   Mm Clip Placement Right  Result Date: 09/23/2018 CLINICAL DATA:  Status post stereotactic biopsies of the right breast. EXAM: DIAGNOSTIC RIGHT MAMMOGRAM POST STEREOTACTIC BIOPSIES COMPARISON:  Previous exam(s). FINDINGS: Mammographic images were obtained following stereotactic guided biopsies of the right breast. Mammographic images show there coil shaped and X shaped clips in the lower outer quadrant of the right breast in appropriate position. IMPRESSION: Status post stereotactic biopsies of the right breast with pathology pending. Final Assessment: Post Procedure Mammograms for Marker Placement Electronically Signed   By: Lillia Mountain M.D.   On: 09/23/2018 11:35   Mm Rt Breast Bx W Loc Dev 1st Lesion Image Bx Spec Stereo Guide  Addendum Date: 09/24/2018   ADDENDUM REPORT: 09/24/2018 13:44 ADDENDUM: Pathology revealed FIBROCYSTIC CHANGES WITH CALCIFICATIONS of the RIGHT breast, lower outer quadrant, both sites, coil clip and X clip. This was found to be concordant by Dr. Lillia Mountain. Pathology results were discussed with Laure Kidney ([place]) from [facility] by telephone, per request .She reported the patient doing well after the biopsy with tenderness at the site. Post  biopsy instructions and care were reviewed and questions were answered. She was encouraged to call [facility] for any additional concerns. The patient was instructed to return for annual screening mammography and informed a reminder notice would be sent regarding this appointment. Pathology results reported by Stacie Acres, RN on 09/24/2018. Electronically Signed   By: Lillia Mountain M.D.   On: 09/24/2018 13:44   Result Date: 09/24/2018 CLINICAL DATA:  Two areas of suspicious calcifications in the right breast. EXAM: RIGHT BREAST STEREOTACTIC CORE NEEDLE BIOPSIES COMPARISON:  Previous exams. FINDINGS: The patient and I discussed the procedure of stereotactic-guided biopsy including benefits and alternatives. We discussed the high likelihood of a successful procedure. We discussed the risks of the procedure including infection, bleeding, tissue injury, clip migration, and inadequate sampling. Informed written consent was given. The usual time out protocol was performed immediately prior to the procedure. Using sterile technique and 1% lidocaine 1% lidocaine with epinephrine as local anesthetic, under stereotactic guidance, a 9 gauge vacuum assisted device was used to perform core needle biopsy of calcifications in the lower outer quadrant of the right breast using a lateral to medial approach. Specimen radiograph was performed showing calcifications are present in the tissue samples. Specimens with calcifications are identified for pathology. Lesion quadrant: Lower outer quadrant At the conclusion of the procedure, a coil shaped tissue marker clip was deployed into the biopsy cavity. Follow-up  2-view mammogram was performed and dictated separately. The patient and I discussed the procedure of stereotactic-guided biopsy including benefits and alternatives. We discussed the high likelihood of a successful procedure. We discussed the risks of the procedure including infection, bleeding, tissue  injury, clip migration, and inadequate sampling. Informed written consent was given. The usual time out protocol was performed immediately prior to the procedure. Using sterile technique and 1% lidocaine 1% lidocaine with epinephrine as local anesthetic, under stereotactic guidance, a 9 gauge vacuum assisted device was used to perform core needle biopsy of calcifications in the lower outer quadrant of the right breast using a lateral to medial approach. Specimen radiograph was performed showing calcifications are present in the tissue sample. Specimens with calcifications are identified for pathology. Lesion quadrant: Lower outer quadrant At the conclusion of the procedure, a X shaped tissue marker clip was deployed into the biopsy cavity. Follow-up 2-view mammogram was performed and dictated separately. IMPRESSION: Stereotactic-guided biopsies of the right breast. No apparent complications. Electronically Signed: By: Lillia Mountain M.D. On: 09/23/2018 11:21   Mm Rt Breast Bx W Loc Dev Ea Ad Lesion Img Bx Spec Stereo Guide  Addendum Date: 09/24/2018   ADDENDUM REPORT: 09/24/2018 13:44 ADDENDUM: Pathology revealed FIBROCYSTIC CHANGES WITH CALCIFICATIONS of the RIGHT breast, lower outer quadrant, both sites, coil clip and X clip. This was found to be concordant by Dr. Lillia Mountain. Pathology results were discussed with Laure Kidney ([place]) from [facility] by telephone, per request .She reported the patient doing well after the biopsy with tenderness at the site. Post biopsy instructions and care were reviewed and questions were answered. She was encouraged to call [facility] for any additional concerns. The patient was instructed to return for annual screening mammography and informed a reminder notice would be sent regarding this appointment. Pathology results reported by Stacie Acres, RN on 09/24/2018. Electronically Signed   By: Lillia Mountain M.D.   On: 09/24/2018  13:44   Result Date: 09/24/2018 CLINICAL DATA:  Two areas of suspicious calcifications in the right breast. EXAM: RIGHT BREAST STEREOTACTIC CORE NEEDLE BIOPSIES COMPARISON:  Previous exams. FINDINGS: The patient and I discussed the procedure of stereotactic-guided biopsy including benefits and alternatives. We discussed the high likelihood of a successful procedure. We discussed the risks of the procedure including infection, bleeding, tissue injury, clip migration, and inadequate sampling. Informed written consent was given. The usual time out protocol was performed immediately prior to the procedure. Using sterile technique and 1% lidocaine 1% lidocaine with epinephrine as local anesthetic, under stereotactic guidance, a 9 gauge vacuum assisted device was used to perform core needle biopsy of calcifications in the lower outer quadrant of the right breast using a lateral to medial approach. Specimen radiograph was performed showing calcifications are present in the tissue samples. Specimens with calcifications are identified for pathology. Lesion quadrant: Lower outer quadrant At the conclusion of the procedure, a coil shaped tissue marker clip was deployed into the biopsy cavity. Follow-up 2-view mammogram was performed and dictated separately. The patient and I discussed the procedure of stereotactic-guided biopsy including benefits and alternatives. We discussed the high likelihood of a successful procedure. We discussed the risks of the procedure including infection, bleeding, tissue injury, clip migration, and inadequate sampling. Informed written consent was given. The usual time out protocol was performed immediately prior to the procedure. Using sterile technique and 1% lidocaine 1% lidocaine with epinephrine as local anesthetic, under stereotactic guidance, a 9  gauge vacuum assisted device was used to perform core needle biopsy of calcifications in the lower outer quadrant of the right breast using a  lateral to medial approach. Specimen radiograph was performed showing calcifications are present in the tissue sample. Specimens with calcifications are identified for pathology. Lesion quadrant: Lower outer quadrant At the conclusion of the procedure, a X shaped tissue marker clip was deployed into the biopsy cavity. Follow-up 2-view mammogram was performed and dictated separately. IMPRESSION: Stereotactic-guided biopsies of the right breast. No apparent complications. Electronically Signed: By: Lillia Mountain M.D. On: 09/23/2018 11:21     Microbiology: No results found for this or any previous visit (from the past 240 hour(s)).   Labs: Basic Metabolic Panel: Recent Labs  Lab 09/30/18 1255  NA 140  K 3.8  CL 107  CO2 24  GLUCOSE 123*  BUN 9  CREATININE 0.78  CALCIUM 9.4   Liver Function Tests: No results for input(s): AST, ALT, ALKPHOS, BILITOT, PROT, ALBUMIN in the last 168 hours. No results for input(s): LIPASE, AMYLASE in the last 168 hours. No results for input(s): AMMONIA in the last 168 hours. CBC: Recent Labs  Lab 09/30/18 1255  WBC 4.6  HGB 12.4  HCT 42.8  MCV 82.5  PLT 190   Cardiac Enzymes: No results for input(s): CKTOTAL, CKMB, CKMBINDEX, TROPONINI in the last 168 hours. BNP: Invalid input(s): POCBNP CBG: Recent Labs  Lab 09/30/18 1240 09/30/18 1759 10/01/18 0852 10/01/18 1209 10/01/18 1414  GLUCAP 95 82 83 103* 88    Time coordinating discharge:  36 minutes  Signed:  Orson Eva, DO [facility] Pager: [phone] 10/01/2018, 2:34 PM

## 2018-10-01 NOTE — Clinical Social Work Note (Signed)
RN to call facility when ready for patient to discharge.  Patient recently returned to floor from having a procedure and wants to eat.   FL2 and discharge summary faxed to facility via [place] hub.   Niece, Oleta Mouse, informed of discharge.   LCSW signing off.     Keiasha Diep, Clydene Pugh, LCSW

## 2018-10-01 NOTE — Transfer of Care (Signed)
Immediate Anesthesia Transfer of Care Note  Patient: Mary Wells  Procedure(s) Performed: COLONOSCOPY WITH PROPOFOL (N/A )  Patient Location: PACU  Anesthesia Type:MAC  Level of Consciousness: awake, alert  and patient cooperative  Airway & Oxygen Therapy: Patient Spontanous Breathing  Post-op Assessment: Report given to RN and Post -op Vital signs reviewed and stable  Post vital signs: Reviewed and stable  Last Vitals:  Vitals Value Taken Time  BP 136/91 10/01/2018  2:08 PM  Temp    Pulse 73 10/01/2018  2:10 PM  Resp 15 10/01/2018  2:10 PM  SpO2 100 % 10/01/2018  2:10 PM  Vitals shown include unvalidated device data.  Last Pain:  Vitals:   10/01/18 1359  TempSrc:   PainSc: 0-No pain      Patients Stated Pain Goal: 8 (25/49/82 6415)  Complications: No apparent anesthesia complications

## 2018-10-01 NOTE — NC FL2 (Deleted)
[place] MEDICAID FL2 LEVEL OF CARE SCREENING TOOL     IDENTIFICATION  Patient Name: Mary Wells Birthdate: 01-14-1956 Sex: female Admission Date (Current Location): 09/30/2018  [facility] and [place] Number:  [employer] and Address:  [place] [address], [place]      Provider Number: [id number]  Attending Physician Name and Address:  Orson Eva, MD  Relative Name and Phone Number:       Current Level of Care: Other (Comment)(observation) Recommended Level of Care: Family Care Home Prior Approval Number:    Date Approved/Denied:   PASRR Number:    Discharge Plan: Domiciliary (Rest home)([facility])    Current Diagnoses: Patient Active Problem List   Diagnosis Date Noted  . Normocytic anemia   . Rectal mass 09/30/2018  . Pulmonary hypertension, unspecified ([place]) 05/05/2017  . Colonic mass   . Adenocarcinoma of colon ([place])   . HLD (hyperlipidemia) 01/22/2016  . GERD (gastroesophageal reflux disease) 01/22/2016  . Encounter for screening for cervical cancer  12/21/2015  . Iron deficiency anemia 09/18/2013  . Diabetes mellitus ([place]) 09/15/2013  . Schizophrenia ([place]) 09/15/2013    Orientation RESPIRATION BLADDER Height & Weight     Self, Time, Situation, Place  Normal Incontinent Weight: 167 lb 8.8 oz (76 kg) Height:  5\' 4"  (162.6 cm)  BEHAVIORAL SYMPTOMS/MOOD NEUROLOGICAL BOWEL NUTRITION STATUS      Continent (heart healthy)  AMBULATORY STATUS COMMUNICATION OF NEEDS Skin   Independent Verbally Normal                       Personal Care Assistance Level of Assistance  Bathing, Feeding, Dressing   Feeding assistance: Independent Dressing Assistance: Limited assistance     Functional Limitations Info  Sight, Hearing, Speech Sight Info: Adequate Hearing Info: Adequate Speech Info: Adequate    SPECIAL CARE FACTORS FREQUENCY                       Contractures Contractures Info: Not present    Additional  Factors Info  Code Status, Allergies, Psychotropic Code Status Info: Full code Allergies Info: NKA Psychotropic Info: Klonopin, Depakote         Current Medications (10/01/2018):  This is the current hospital active medication list Current Facility-Administered Medications  Medication Dose Route Frequency Provider Last Rate Last Dose  . acetaminophen (TYLENOL) tablet 650 mg  650 mg Oral Q6H PRN Tat, David, MD       Or  . acetaminophen (TYLENOL) suppository 650 mg  650 mg Rectal Q6H PRN Tat, Shanon Brow, MD      . benztropine (COGENTIN) tablet 1 mg  1 mg Oral BID Tat, David, MD   1 mg at 09/30/18 2124  . clonazePAM (KLONOPIN) tablet 0.5 mg  0.5 mg Oral BID Orson Eva, MD   0.5 mg at 09/30/18 2124  . divalproex (DEPAKOTE ER) 24 hr tablet 250 mg  250 mg Oral QHS Orson Eva, MD   250 mg at 09/30/18 2124  . famotidine (PEPCID) tablet 20 mg  20 mg Oral Daily Tat, David, MD   20 mg at 09/30/18 1249  . fluPHENAZine (PROLIXIN) tablet 5 mg  5 mg Oral BID Orson Eva, MD   5 mg at 09/30/18 2124  . insulin aspart (novoLOG) injection 0-9 Units  0-9 Units Subcutaneous TID WC Tat, David, MD      . loratadine (CLARITIN) tablet 10 mg  10 mg Oral Daily Tat, Shanon Brow,  MD      . ondansetron (ZOFRAN) tablet 4 mg  4 mg Oral Q6H PRN Tat, Shanon Brow, MD       Or  . ondansetron (ZOFRAN) injection 4 mg  4 mg Intravenous Q6H PRN Tat, Shanon Brow, MD      . simvastatin (ZOCOR) tablet 20 mg  20 mg Oral q1800 Tat, David, MD   20 mg at 09/30/18 1649     Discharge Medications: TAKE these medications   benztropine 1 MG tablet Commonly known as:  COGENTIN Take 1 mg by mouth 2 (two) times daily.   clonazePAM 0.5 MG tablet Commonly known as:  KLONOPIN Take 0.5 mg by mouth 2 (two) times daily.   DEPAKOTE ER 250 MG 24 hr tablet Generic drug:  divalproex Take 250 mg by mouth at bedtime.   docusate sodium 100 MG capsule Commonly known as:  COLACE Take 100 mg by mouth daily as needed for mild constipation.   famotidine 20 MG  tablet Commonly known as:  PEPCID Take 20 mg by mouth daily.   loratadine 10 MG tablet Commonly known as:  CLARITIN Take 10 mg by mouth daily.   metFORMIN 500 MG tablet Commonly known as:  GLUCOPHAGE Take 500 mg by mouth 2 (two) times daily with a meal.   NON FORMULARY Take 1 capsule by mouth 2 (two) times daily. Fiber capsules 520 mg.   simvastatin 20 MG tablet Commonly known as:  ZOCOR Take 20 mg by mouth daily.     Relevant Imaging Results:  Relevant Lab Results:   Additional Information n/a  Ihor Gully, LCSW

## 2018-10-01 NOTE — Progress Notes (Signed)
Tap water enema complete at this time. Pt tolerated well and understanding of task. No fecal matter noted in output, light brown in color.

## 2018-10-01 NOTE — NC FL2 (Signed)
[place] MEDICAID FL2 LEVEL OF CARE SCREENING TOOL     IDENTIFICATION  Patient Name: Mary Wells Birthdate: 07/05/1956 Sex: female Admission Date (Current Location): 09/30/2018  [facility] and [place] Number:  [employer] and Address:  [place] [address], [place]      Provider Number: [id number]  Attending Physician Name and Address:  Orson Eva, MD  Relative Name and Phone Number:       Current Level of Care: Other (Comment)(observation) Recommended Level of Care: Family Care Home Prior Approval Number:    Date Approved/Denied:   PASRR Number:    Discharge Plan: Domiciliary (Rest home)([facility])    Current Diagnoses: Patient Active Problem List   Diagnosis Date Noted  . Normocytic anemia   . Rectal mass 09/30/2018  . Pulmonary hypertension, unspecified ([place]) 05/05/2017  . Colonic mass   . Adenocarcinoma of colon ([place])   . HLD (hyperlipidemia) 01/22/2016  . GERD (gastroesophageal reflux disease) 01/22/2016  . Encounter for screening for cervical cancer  12/21/2015  . Iron deficiency anemia 09/18/2013  . Diabetes mellitus ([place]) 09/15/2013  . Schizophrenia ([place]) 09/15/2013    Orientation RESPIRATION BLADDER Height & Weight     Self, Time, Situation, Place  Normal Incontinent Weight: 167 lb 8.8 oz (76 kg) Height:  5\' 4"  (162.6 cm)  BEHAVIORAL SYMPTOMS/MOOD NEUROLOGICAL BOWEL NUTRITION STATUS      Continent (heart healthy)  AMBULATORY STATUS COMMUNICATION OF NEEDS Skin   Independent Verbally Normal                       Personal Care Assistance Level of Assistance  Bathing, Feeding, Dressing Bathing Assistance: Limited assistance Feeding assistance: Independent Dressing Assistance: Limited assistance     Functional Limitations Info  Sight, Hearing, Speech Sight Info: Adequate Hearing Info: Adequate Speech Info: Adequate    SPECIAL CARE FACTORS FREQUENCY                       Contractures Contractures  Info: Not present    Additional Factors Info  Code Status, Allergies, Psychotropic Code Status Info: Full code Allergies Info: NKA Psychotropic Info: Cogentin, Klonopin         Current Medications (10/01/2018):  This is the current hospital active medication list Current Facility-Administered Medications  Medication Dose Route Frequency Provider Last Rate Last Dose  . acetaminophen (TYLENOL) tablet 650 mg  650 mg Oral Q6H PRN Tat, David, MD       Or  . acetaminophen (TYLENOL) suppository 650 mg  650 mg Rectal Q6H PRN Tat, Shanon Brow, MD      . benztropine (COGENTIN) tablet 1 mg  1 mg Oral BID Tat, David, MD   1 mg at 09/30/18 2124  . clonazePAM (KLONOPIN) tablet 0.5 mg  0.5 mg Oral BID Orson Eva, MD   0.5 mg at 09/30/18 2124  . divalproex (DEPAKOTE ER) 24 hr tablet 250 mg  250 mg Oral QHS Orson Eva, MD   250 mg at 09/30/18 2124  . famotidine (PEPCID) tablet 20 mg  20 mg Oral Daily Tat, David, MD   20 mg at 09/30/18 1249  . fluPHENAZine (PROLIXIN) tablet 5 mg  5 mg Oral BID Orson Eva, MD   5 mg at 09/30/18 2124  . insulin aspart (novoLOG) injection 0-9 Units  0-9 Units Subcutaneous TID WC Tat, David, MD      . loratadine (CLARITIN) tablet 10 mg  10 mg Oral Daily  Orson Eva, MD      . ondansetron [facility]) tablet 4 mg  4 mg Oral Q6H PRN Tat, Shanon Brow, MD       Or  . ondansetron (ZOFRAN) injection 4 mg  4 mg Intravenous Q6H PRN Tat, Shanon Brow, MD      . simvastatin (ZOCOR) tablet 20 mg  20 mg Oral q1800 Tat, Shanon Brow, MD   20 mg at 09/30/18 1649     Discharge Medications: TAKE these medications   benztropine 1 MG tablet Commonly known as:  COGENTIN Take 1 mg by mouth 2 (two) times daily.   clonazePAM 0.5 MG tablet Commonly known as:  KLONOPIN Take 0.5 mg by mouth 2 (two) times daily.   DEPAKOTE ER 250 MG 24 hr tablet Generic drug:  divalproex Take 250 mg by mouth at bedtime.   docusate sodium 100 MG capsule Commonly known as:  COLACE Take 100 mg by mouth daily as needed for mild  constipation.   famotidine 20 MG tablet Commonly known as:  PEPCID Take 20 mg by mouth daily.   loratadine 10 MG tablet Commonly known as:  CLARITIN Take 10 mg by mouth daily.   metFORMIN 500 MG tablet Commonly known as:  GLUCOPHAGE Take 500 mg by mouth 2 (two) times daily with a meal.   NON FORMULARY Take 1 capsule by mouth 2 (two) times daily. Fiber capsules 520 mg.   simvastatin 20 MG tablet Commonly known as:  ZOCOR Take 20 mg by mouth daily.     Relevant Imaging Results:  Relevant Lab Results:   Additional Information n/a  Ihor Gully, LCSW

## 2018-10-01 NOTE — Progress Notes (Signed)
Patient discharged home today per MD orders. Patient vital signs WDL. IV removed and site WDL. Discharge Instructions including follow up appointments, medications, and education reviewed with patient. Patient verbalizes understanding. Patient is transported out via wheelchair.  

## 2018-10-02 NOTE — Addendum Note (Signed)
Addendum  created 10/02/18 1356 by Ollen Bowl, CRNA   Intraprocedure Staff edited

## 2018-10-05 ENCOUNTER — Telehealth: Payer: Self-pay | Admitting: Gastroenterology

## 2018-10-05 NOTE — Telephone Encounter (Signed)
[phone] PATIENT NEEDS IRON PILL PRESCRIPTION REFILLED, PLEASE CALL. PHARMACY IS RX CARE

## 2018-10-05 NOTE — Telephone Encounter (Signed)
Forwarding to AM, this is RMR pt.

## 2018-10-05 NOTE — Telephone Encounter (Signed)
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 Pt was asked to hold her iron 7 days before her TCS. Pt's office needed refills on iron and was asked to call the office that provides iron RX for pt. Supervisor is ok with calling Dr. Legrand Rams.

## 2018-10-06 ENCOUNTER — Encounter (HOSPITAL_COMMUNITY): Payer: Self-pay | Admitting: Gastroenterology

## 2018-10-15 ENCOUNTER — Ambulatory Visit (INDEPENDENT_AMBULATORY_CARE_PROVIDER_SITE_OTHER): Payer: Medicaid Other | Admitting: Advanced Practice Midwife

## 2018-10-15 ENCOUNTER — Encounter: Payer: Self-pay | Admitting: Advanced Practice Midwife

## 2018-10-15 ENCOUNTER — Other Ambulatory Visit: Payer: Self-pay

## 2018-10-15 VITALS — BP 104/65 | HR 57 | Ht 64.0 in | Wt 165.0 lb

## 2018-10-15 DIAGNOSIS — B9689 Other specified bacterial agents as the cause of diseases classified elsewhere: Secondary | ICD-10-CM | POA: Diagnosis not present

## 2018-10-15 DIAGNOSIS — N760 Acute vaginitis: Secondary | ICD-10-CM

## 2018-10-15 DIAGNOSIS — Z30432 Encounter for removal of intrauterine contraceptive device: Secondary | ICD-10-CM

## 2018-10-15 MED ORDER — METRONIDAZOLE 500 MG PO TABS: 500.0000 mg | ORAL_TABLET | Freq: Two times a day (BID) | ORAL | 0 refills | Status: DC

## 2018-10-15 NOTE — Progress Notes (Signed)
Mary Wells 63 y.o.  Vitals:   10/15/18 1350  BP: 104/65  Pulse: (!) 57   Past Medical History:  Diagnosis Date  . Adenocarcinoma of colon ([place])   . Allergy   . Diabetes mellitus without complication ([place]) 6333  . GERD (gastroesophageal reflux disease)   . Iron deficiency anemia 09/18/2013  . Schizophrenia [facility])    Past Surgical History:  Procedure Laterality Date  . BIOPSY  01/24/2016   Procedure: BIOPSY;  Surgeon: Daneil Dolin, MD;  Location: [facility];  Service: Endoscopy;;  cecal mass  . COLONOSCOPY  04/21/2006   SLF: limited colonoscopy due to a poor bowel prep   . COLONOSCOPY  02/22/2009   SLF: poor bowel prep/Formed stools in the right colon which limited the extent of the exam/The scope was passed to approximately the proximal transverse colon.  No polyps, masses, inflammatory changes, diverticular/normal rectum  . COLONOSCOPY WITH PROPOFOL N/A 01/24/2016   Dr. Gala Romney: large fungating, exophytic tumor arising out of the base of the cecum/appendix. Partially obscuring the ileocecal valve  . COLONOSCOPY WITH PROPOFOL N/A 10/01/2018   Procedure: COLONOSCOPY WITH PROPOFOL;  Surgeon: Danie Binder, MD;  Location: [facility];  Service: Endoscopy;  Laterality: N/A;  . EXCISIONAL HEMORRHOIDECTOMY    . LIVER BIOPSY N/A 01/29/2016   Procedure: LIVER BIOPSY;  Surgeon: Aviva Signs, MD;  Location: [facility] ORS;  Service: General;  Laterality: N/A;  . PARTIAL COLECTOMY N/A 01/29/2016   Procedure: PARTIAL COLECTOMY ;  Surgeon: Aviva Signs, MD;  Location: [facility] ORS;  Service: General;  Laterality: N/A;  . PORTACATH PLACEMENT Left 01/29/2016   Procedure: INSERTION PORT-A-CATH;  Surgeon: Aviva Signs, MD;  Location: [facility] ORS;  Service: General;  Laterality: Left;  left subclavian   family history includes Leukemia in her cousin and paternal grandfather.  Current Outpatient Medications:  .  benztropine (COGENTIN) 1 MG tablet, Take 1 mg by mouth 2 (two) times daily., Disp: , Rfl:  .   clonazePAM (KLONOPIN) 0.5 MG tablet, Take 0.5 mg by mouth 2 (two) times daily. , Disp: , Rfl:  .  divalproex (DEPAKOTE ER) 250 MG 24 hr tablet, Take 250 mg by mouth at bedtime., Disp: , Rfl:  .  docusate sodium (COLACE) 100 MG capsule, Take 100 mg by mouth daily as needed for mild constipation., Disp: , Rfl:  .  famotidine (PEPCID) 20 MG tablet, Take 20 mg by mouth daily., Disp: , Rfl:  .  loratadine (CLARITIN) 10 MG tablet, Take 10 mg by mouth daily., Disp: , Rfl:  .  metFORMIN (GLUCOPHAGE) 500 MG tablet, Take 500 mg by mouth 2 (two) times daily with a meal. , Disp: , Rfl:  .  NON FORMULARY, Take 1 capsule by mouth 2 (two) times daily. Fiber capsules 520 mg., Disp: , Rfl:  .  simvastatin (ZOCOR) 20 MG tablet, Take 20 mg by mouth daily., Disp: , Rfl:  .  metroNIDAZOLE (FLAGYL) 500 MG tablet, Take 1 tablet (500 mg total) by mouth 2 (two) times daily., Disp: 14 tablet, Rfl: 0    Here for [name] removal.  She had the mirena [name] placed >20 years ago and would like it removed after it was discovered on a CT scan. . A graves speculum was placed, and the strings were visible.  They were grasped with a curved Claiborne Billings and the [name] easily removed.  Pt given [name] removal f/u instructions. Yellow frothy discharge w/aminie odor. Wet prep + clue, wbc, no trich or yeast.  rx flagyl

## 2018-12-08 ENCOUNTER — Encounter (HOSPITAL_COMMUNITY): Payer: Medicaid Other

## 2018-12-25 ENCOUNTER — Other Ambulatory Visit: Payer: Self-pay

## 2018-12-25 ENCOUNTER — Ambulatory Visit (HOSPITAL_COMMUNITY)
Admission: RE | Admit: 2018-12-25 | Discharge: 2018-12-25 | Disposition: A | Discharge status: Home / Self Care | Payer: Medicaid Other | Source: Ambulatory Visit | Attending: Internal Medicine | Admitting: Internal Medicine

## 2019-03-23 ENCOUNTER — Other Ambulatory Visit: Payer: Self-pay

## 2019-03-23 ENCOUNTER — Inpatient Hospital Stay (HOSPITAL_COMMUNITY): Payer: Medicare Other | Attending: Hematology

## 2019-03-23 VITALS — BP 124/50 | HR 45 | Temp 97.3°F | Resp 18

## 2019-03-23 DIAGNOSIS — K5900 Constipation, unspecified: Secondary | ICD-10-CM | POA: Diagnosis not present

## 2019-03-23 DIAGNOSIS — Z452 Encounter for adjustment and management of vascular access device: Secondary | ICD-10-CM | POA: Diagnosis not present

## 2019-03-23 DIAGNOSIS — F17210 Nicotine dependence, cigarettes, uncomplicated: Secondary | ICD-10-CM | POA: Diagnosis not present

## 2019-03-23 DIAGNOSIS — C189 Malignant neoplasm of colon, unspecified: Secondary | ICD-10-CM | POA: Insufficient documentation

## 2019-03-23 DIAGNOSIS — E119 Type 2 diabetes mellitus without complications: Secondary | ICD-10-CM | POA: Diagnosis not present

## 2019-03-23 DIAGNOSIS — Z79899 Other long term (current) drug therapy: Secondary | ICD-10-CM | POA: Diagnosis not present

## 2019-03-23 DIAGNOSIS — F209 Schizophrenia, unspecified: Secondary | ICD-10-CM | POA: Insufficient documentation

## 2019-03-23 MED ORDER — HEPARIN SOD (PORK) LOCK FLUSH 100 UNIT/ML IV SOLN
500.0000 [IU] | Freq: Once | INTRAVENOUS | Status: AC
  Administered 2019-03-23: 500 [IU] via INTRAVENOUS

## 2019-03-23 MED ORDER — SODIUM CHLORIDE 0.9% FLUSH
10.0000 mL | Freq: Once | INTRAVENOUS | Status: AC
  Administered 2019-03-23: 10 mL via INTRAVENOUS

## 2019-03-23 NOTE — Progress Notes (Signed)
Patients port flushed without difficulty.  No blood return noted before and after flush. No complaints of pain with flush and no bruising or swelling noted at site.  Band aid applied.  VSS with discharge and left ambulatory with no s/s of distress noted.  

## 2019-03-30 ENCOUNTER — Inpatient Hospital Stay (HOSPITAL_BASED_OUTPATIENT_CLINIC_OR_DEPARTMENT_OTHER): Payer: Medicare Other | Admitting: Nurse Practitioner

## 2019-03-30 ENCOUNTER — Other Ambulatory Visit (HOSPITAL_COMMUNITY): Payer: Self-pay | Admitting: Nurse Practitioner

## 2019-03-30 ENCOUNTER — Inpatient Hospital Stay (HOSPITAL_COMMUNITY): Payer: Medicare Other

## 2019-03-30 ENCOUNTER — Encounter (HOSPITAL_COMMUNITY): Payer: Self-pay | Admitting: Nurse Practitioner

## 2019-03-30 ENCOUNTER — Other Ambulatory Visit: Payer: Self-pay

## 2019-03-30 VITALS — BP 114/56 | HR 54 | Temp 96.8°F | Resp 16 | Wt 160.1 lb

## 2019-03-30 DIAGNOSIS — R928 Other abnormal and inconclusive findings on diagnostic imaging of breast: Secondary | ICD-10-CM | POA: Diagnosis not present

## 2019-03-30 DIAGNOSIS — C189 Malignant neoplasm of colon, unspecified: Secondary | ICD-10-CM

## 2019-03-30 DIAGNOSIS — R933 Abnormal findings on diagnostic imaging of other parts of digestive tract: Secondary | ICD-10-CM | POA: Diagnosis not present

## 2019-03-30 DIAGNOSIS — Z1231 Encounter for screening mammogram for malignant neoplasm of breast: Secondary | ICD-10-CM

## 2019-03-30 LAB — CBC WITH DIFFERENTIAL/PLATELET
Abs Immature Granulocytes: 0.01 10*3/uL (ref 0.00–0.07)
Basophils Absolute: 0 10*3/uL (ref 0.0–0.1)
Basophils Relative: 1 %
Eosinophils Absolute: 0.1 10*3/uL (ref 0.0–0.5)
Eosinophils Relative: 2 %
HCT: 39.1 % (ref 36.0–46.0)
Hemoglobin: 11.8 g/dL — ABNORMAL LOW (ref 12.0–15.0)
Immature Granulocytes: 0 %
Lymphocytes Relative: 32 %
Lymphs Abs: 1.5 10*3/uL (ref 0.7–4.0)
MCH: 25.2 pg — ABNORMAL LOW (ref 26.0–34.0)
MCHC: 30.2 g/dL (ref 30.0–36.0)
MCV: 83.5 fL (ref 80.0–100.0)
Monocytes Absolute: 0.4 10*3/uL (ref 0.1–1.0)
Monocytes Relative: 9 %
Neutro Abs: 2.5 10*3/uL (ref 1.7–7.7)
Neutrophils Relative %: 56 %
Platelets: 154 10*3/uL (ref 150–400)
RBC: 4.68 MIL/uL (ref 3.87–5.11)
RDW: 16.3 % — ABNORMAL HIGH (ref 11.5–15.5)
WBC: 4.5 10*3/uL (ref 4.0–10.5)
nRBC: 0 % (ref 0.0–0.2)

## 2019-03-30 LAB — COMPREHENSIVE METABOLIC PANEL
ALT: 13 U/L (ref 0–44)
AST: 18 U/L (ref 15–41)
Albumin: 3.9 g/dL (ref 3.5–5.0)
Alkaline Phosphatase: 49 U/L (ref 38–126)
Anion gap: 7 (ref 5–15)
BUN: 13 mg/dL (ref 8–23)
CO2: 26 mmol/L (ref 22–32)
Calcium: 9.6 mg/dL (ref 8.9–10.3)
Chloride: 105 mmol/L (ref 98–111)
Creatinine, Ser: 0.89 mg/dL (ref 0.44–1.00)
GFR calc Af Amer: >60 mL/min (ref >60)
GFR calc non Af Amer: >60 mL/min (ref >60)
Glucose, Bld: 89 mg/dL (ref 70–99)
Potassium: 4.1 mmol/L (ref 3.5–5.1)
Sodium: 138 mmol/L (ref 135–145)
Total Bilirubin: 0.2 mg/dL — ABNORMAL LOW (ref 0.3–1.2)
Total Protein: 7.7 g/dL (ref 6.5–8.1)

## 2019-03-30 LAB — LACTATE DEHYDROGENASE: LDH: 112 U/L (ref 98–192)

## 2019-03-30 NOTE — Progress Notes (Signed)
[place], [place] [address]   CLINIC:  Medical Oncology/Hematology  PCP:  Rosita Fire, MD [place] [id number] [phone]   REASON FOR VISIT: Follow-up for stage II adenocarcinoma the colon  CURRENT THERAPY: Observation  BRIEF ONCOLOGIC HISTORY:  Oncology History  Adenocarcinoma of colon ([place])  01/25/2016 Imaging   CT C/A/P large 9 cm mass within the colon at level of hepatic flexure, enlarged mesenteric LN concerning for metastatic disease, There is a nonspecific 13 mm nodule within the right hemipelvis which may represent a metastatic deposit. Alternatively, given the appearance, this may represent a small amount of loculated fluid. There are nonspecific low-attenuation lesions within the liver, the majority which are subcentimeter in size. Metastatic disease is not excluded.    01/25/2016 Procedure   Colonoscopy on 6/22 with Large fungating, exophytic tumor appears to be arising out of the base of cecum / appendix. It's partially obscuring the ileocecal valve.   01/29/2016 Surgery   R hemicolectomy with resection of terminal ileum, liver biopsy with Dr. Arnoldo Morale   01/29/2016 Pathology Results   INVASIVE WELL DIFFERENTIATED ADENOCARCINOMA WITH ABUNDANT EXTRACELLULAR MUCIN, SPANNING 19 CM IN GREATEST DIMENSION. - TUMOR INVADES THROUGH MUSCULARIS PROPRIA THROUGH THE SEROSA OF ONE BOWEL SEGMENT INTO A SECOND ADHESED SEGMENT OF BOWEL. - MARGINS ARE NEGATIVE.No extramural satellite tumor nodules seen, 0/27 LN, Liver biopsy with benign liver tissue   03/15/2016 PET scan   No abnormal metabolic activity status post interval right hemicolectomy. No evidence of residual or metastatic disease. The right upper lobe pulmonary nodule is stable and without hypermetabolic activity. This favors a benign etiology.      CANCER STAGING: Cancer Staging Adenocarcinoma of colon [facility]) Staging form: Colon and Rectum, AJCC 7th  Edition - Pathologic stage from 02/02/2016: Stage IIC (T4b, N0, cM0) - Signed by Baird Cancer, PA-C on 07/04/2016    INTERVAL HISTORY:  Ms. Diallo 63 y.o. female returns for routine follow-up for stage II adenocarcinoma of the colon.  Patient reports she has been feeling fine since her last visit.  She does have intermittent constipation issues.  She also reports she has occasional blood when wiping from her hemorrhoids.  Otherwise everything is normal. Denies any nausea, vomiting, or diarrhea. Denies any new pains. Had not noticed any recent bleeding such as epistaxis, hematuria or hematochezia. Denies recent chest pain on exertion, shortness of breath on minimal exertion, pre-syncopal episodes, or palpitations. Denies any numbness or tingling in hands or feet. Denies any recent fevers, infections, or recent hospitalizations. Patient reports appetite at 75% and energy level at 75%.  Patient is eating well maintaining her weight at this time.  Patient was brought here by her caretaker of her group home.     REVIEW OF SYSTEMS:  Review of Systems  Gastrointestinal: Positive for constipation.  All other systems reviewed and are negative.    PAST MEDICAL/SURGICAL HISTORY:  Past Medical History:  Diagnosis Date  . Adenocarcinoma of colon ([place])   . Allergy   . Diabetes mellitus without complication ([place]) 123456  . GERD (gastroesophageal reflux disease)   . Iron deficiency anemia 09/18/2013  . Schizophrenia [facility])    Past Surgical History:  Procedure Laterality Date  . BIOPSY  01/24/2016   Procedure: BIOPSY;  Surgeon: Daneil Dolin, MD;  [facility];  Service: Endoscopy;;  cecal mass  . COLONOSCOPY  04/21/2006   SLF: limited colonoscopy due to a poor bowel prep   .  COLONOSCOPY  02/22/2009   SLF: poor bowel prep/Formed stools in the right colon which limited the extent of the exam/The scope was passed to approximately the proximal transverse colon.  No polyps, masses,  inflammatory changes, diverticular/normal rectum  . COLONOSCOPY WITH PROPOFOL N/A 01/24/2016   Dr. Gala Romney: large fungating, exophytic tumor arising out of the base of the cecum/appendix. Partially obscuring the ileocecal valve  . COLONOSCOPY WITH PROPOFOL N/A 10/01/2018   Procedure: COLONOSCOPY WITH PROPOFOL;  Surgeon: Danie Binder, MD;  Location: [facility];  Service: Endoscopy;  Laterality: N/A;  . EXCISIONAL HEMORRHOIDECTOMY    . LIVER BIOPSY N/A 01/29/2016   Procedure: LIVER BIOPSY;  Surgeon: Aviva Signs, MD;  Location: [facility] ORS;  Service: General;  Laterality: N/A;  . PARTIAL COLECTOMY N/A 01/29/2016   Procedure: PARTIAL COLECTOMY ;  Surgeon: Aviva Signs, MD;  Location: [facility] ORS;  Service: General;  Laterality: N/A;  . PORTACATH PLACEMENT Left 01/29/2016   Procedure: INSERTION PORT-A-CATH;  Surgeon: Aviva Signs, MD;  Location: [facility] ORS;  Service: General;  Laterality: Left;  left subclavian     SOCIAL HISTORY:  Social History   Socioeconomic History  . Marital status: Single    Spouse name: Not on file  . Number of children: Not on file  . Years of education: Not on file  . Highest education level: Not on file  Occupational History  . Not on file  Social Needs  . Financial resource strain: Not on file  . Food insecurity    Worry: Not on file    Inability: Not on file  . Transportation needs    Medical: Not on file    Non-medical: Not on file  Tobacco Use  . Smoking status: Current Every Day Smoker    Packs/day: 0.50    Years: 19.00    Pack years: 9.50    Types: Cigarettes  . Smokeless tobacco: Never Used  . Tobacco comment: 3 cigs daily  Substance and Sexual Activity  . Alcohol use: No    Alcohol/week: 0.0 standard drinks  . Drug use: No  . Sexual activity: Never  Lifestyle  . Physical activity    Days per week: Not on file    Minutes per session: Not on file  . Stress: Not on file  Relationships  . Social Herbalist on phone: Not on file    Gets  together: Not on file    Attends religious service: Not on file    Active member of club or organization: Not on file    Attends meetings of clubs or organizations: Not on file    Relationship status: Not on file  . Intimate partner violence    Fear of current or ex partner: Not on file    Emotionally abused: Not on file    Physically abused: Not on file    Forced sexual activity: Not on file  Other Topics Concern  . Not on file  Social History Narrative  . Not on file    FAMILY HISTORY:  Family History  Problem Relation Age of Onset  . Leukemia Paternal Grandfather   . Leukemia Cousin   . Colon cancer Neg Hx     CURRENT MEDICATIONS:  Outpatient Encounter Medications as of 03/30/2019  Medication Sig  . benztropine (COGENTIN) 1 MG tablet Take 1 mg by mouth 2 (two) times daily.  . clonazePAM (KLONOPIN) 0.5 MG tablet Take 0.5 mg by mouth 2 (two) times daily.   . divalproex (  DEPAKOTE ER) 250 MG 24 hr tablet Take 250 mg by mouth at bedtime.  . famotidine (PEPCID) 20 MG tablet Take 20 mg by mouth daily.  . Ferrous Sulfate (IRON HIGH-POTENCY) 325 MG TABS Take 325 mg by mouth 2 (two) times daily with a meal.  . fluPHENAZine (PROLIXIN) 5 MG tablet two by mouth two times per day  . loratadine (CLARITIN) 10 MG tablet Take 10 mg by mouth daily.  . metFORMIN (GLUCOPHAGE) 500 MG tablet Take 500 mg by mouth 2 (two) times daily with a meal.   . PSYLLIUM FIBER PO Take 1 capsule by mouth 2 (two) times daily.   . simvastatin (ZOCOR) 20 MG tablet Take 20 mg by mouth daily.  . [DISCONTINUED] polyethylene glycol-electrolytes (NULYTELY/GOLYTELY) 420 g solution Take 4,000 mLs by mouth as directed.  . docusate sodium (COLACE) 100 MG capsule Take 100 mg by mouth daily as needed for mild constipation.  . [DISCONTINUED] metroNIDAZOLE (FLAGYL) 500 MG tablet Take 1 tablet (500 mg total) by mouth 2 (two) times daily.  . [DISCONTINUED] NON FORMULARY Take 1 capsule by mouth 2 (two) times daily. Fiber capsules  520 mg.   No facility-administered encounter medications on file as of 03/30/2019.     ALLERGIES:  No Known Allergies   PHYSICAL EXAM:  ECOG Performance status: 1  Vital sign: Blood pressure 114/56 Pulse 54 Respirations 20 Temperature 96.8 O2 sat 100% on room air   :Physical Exam Constitutional:      Appearance: Normal appearance. She is normal weight.  Cardiovascular:     Rate and Rhythm: Normal rate and regular rhythm.     Heart sounds: Normal heart sounds.  Pulmonary:     Effort: Pulmonary effort is normal.     Breath sounds: Normal breath sounds.  Abdominal:     General: Bowel sounds are normal.     Palpations: Abdomen is soft.  Musculoskeletal: Normal range of motion.  Skin:    General: Skin is warm and dry.  Neurological:     Mental Status: She is alert and oriented to person, place, and time. Mental status is at baseline.  Psychiatric:        Mood and Affect: Mood normal.        Behavior: Behavior normal.        Thought Content: Thought content normal.        Judgment: Judgment normal.      LABORATORY DATA:  I have reviewed the labs as listed.  CBC    Component Value Date/Time   WBC 4.6 09/30/2018 1255   RBC 5.19 (H) 09/30/2018 1255   HGB 12.4 09/30/2018 1255   HCT 42.8 09/30/2018 1255   PLT 190 09/30/2018 1255   MCV 82.5 09/30/2018 1255   MCH 23.9 (L) 09/30/2018 1255   MCHC 29.0 (L) 09/30/2018 1255   RDW 16.0 (H) 09/30/2018 1255   LYMPHSABS 1.8 09/16/2018 1223   MONOABS 0.5 09/16/2018 1223   EOSABS 0.1 09/16/2018 1223   BASOSABS 0.0 09/16/2018 1223   CMP Latest Ref Rng & Units 09/30/2018 09/16/2018 02/09/2018  Glucose 70 - 99 mg/dL 123(H) 150(H) 105(H)  BUN 8 - 23 mg/dL 9 17 14   Creatinine 0.44 - 1.00 mg/dL 0.78 0.79 0.89  Sodium 135 - 145 mmol/L 140 139 139  Potassium 3.5 - 5.1 mmol/L 3.8 4.6 4.3  Chloride 98 - 111 mmol/L 107 107 106  CO2 22 - 32 mmol/L 24 22 26   Calcium 8.9 - 10.3 mg/dL 9.4 9.9 9.4  Total Protein  6.5 - 8.1 g/dL - 8.0 7.4   Total Bilirubin 0.3 - 1.2 mg/dL - 0.4 0.4  Alkaline Phos 38 - 126 U/L - 51 50  AST 15 - 41 U/L - 24 21  ALT 0 - 44 U/L - 16 14    I personally performed a face-to-face visit.  All questions were answered to patient's stated satisfaction. Encouraged patient to call with any new concerns or questions before his next visit to the cancer center and we can certain see him sooner, if needed.     ASSESSMENT & PLAN:   Adenocarcinoma of colon ([place]) 1.  Stage II adenocarcinoma of the colon: - Patient was diagnosed 01/2016. -She was treated with a right hemicolectomy with resection of terminal ileum.  There was a questionable liver lesion on image which was biopsied and negative for malignancy. -Postsurgical PET scan on 03/2016 showed negative for residual or metastatic disease.  There was a notation of RUL pulmonary nodule that was stable and non-hypermetabolic -CT of the chest on 01/2017 revealed stable lung nodule with benign etiology.  Also negative for recurrent or metastatic disease. -CT CAP done on 02/12/2018 showed 9 mm stable nodule in the posterior right upper lobe dating back to August 2017.  Stable and lacks metabolic activity on prior PET.  No evidence of metastatic disease in the thorax.  No evidence of metastatic disease involving the abdomen or pelvis.  Hepatic steatosis. - Colonoscopy done on 10/01/2018 showed the examinee did portion of the ileum was normal.  There was significant looping of the colon.  External and internal hemorrhoids.  No mass in the rectum.  Repeat colonoscopy in 5 years. -CT CAP done on 09/24/2018 showed stable postsurgical changes from right hemo-colectomy with no evidence of anastomotic tumor recurrence.  No findings of metastatic disease.  Questionable mild diffuse bladder wall thickening may be due to to under distention. -Patient was referred to urology but has not kept any of her appointments. - Labs done on 03/30/2019 showed -Patient will return in 6 months with  repeat CT CAP and labs.  2.  Breast calcifications: - Patient had a mammogram on 08/2018 that was BI-RADS Category 0 suspicious. - Patient had a right breast biopsy done on 09/23/2018 that showed fibrocystic changes. - She will have a repeat mammogram in 6 months.  3.  Noncompliance: -Patient has a history of being noncompliant with follow-up and referrals. -Patient lives in a group home and is under a caretaker and relies on them for transportation.      Orders placed this encounter:  Orders Placed This Encounter  Procedures  . MM Digital Diagnostic Bilat  . CT CHEST W CONTRAST  . CT ABDOMEN PELVIS W CONTRAST  . Lactate dehydrogenase  . CEA  . CBC with Differential/Platelet  . Comprehensive metabolic panel  . Vitamin B12  . VITAMIN D 25 Hydroxy (Vit-D Deficiency, Fractures)      Francene Finders FNP-C [place] [phone]

## 2019-03-30 NOTE — Patient Instructions (Signed)
[place] at [facility] Discharge Instructions  Follow-up in 6 months with repeat CT scans and labs.   Thank you for choosing [place] at [facility] to provide your oncology and hematology care.  To afford each patient quality time with our provider, please arrive at least 15 minutes before your scheduled appointment time.   If you have a lab appointment with the [facility] please come in thru the Main Entrance and check in at the main information desk.  You need to re-schedule your appointment should you arrive 10 or more minutes late.  We strive to give you quality time with our providers, and arriving late affects you and other patients whose appointments are after yours.  Also, if you no show three or more times for appointments you may be dismissed from the clinic at the providers discretion.     Again, thank you for choosing [facility].  Our hope is that these requests will decrease the amount of time that you wait before being seen by our physicians.       _____________________________________________________________  Should you have questions after your visit to [facility], please contact our office at [phone] between the hours of 8:00 a.m. and 4:30 p.m.  Voicemails left after 4:00 p.m. will not be returned until the following business day.  For prescription refill requests, have your pharmacy contact our office and allow 72 hours.    Due to Covid, you will need to wear a mask upon entering the hospital. If you do not have a mask, a mask will be given to you at the Main Entrance upon arrival. For doctor visits, patients may have 1 support person with them. For treatment visits, patients can not have anyone with them due to social distancing guidelines and our immunocompromised population.

## 2019-03-30 NOTE — Assessment & Plan Note (Signed)
1.  Stage II adenocarcinoma of the colon: - Patient was diagnosed 01/2016. -She was treated with a right hemicolectomy with resection of terminal ileum.  There was a questionable liver lesion on image which was biopsied and negative for malignancy. -Postsurgical PET scan on 03/2016 showed negative for residual or metastatic disease.  There was a notation of RUL pulmonary nodule that was stable and non-hypermetabolic -CT of the chest on 01/2017 revealed stable lung nodule with benign etiology.  Also negative for recurrent or metastatic disease. -CT CAP done on 02/12/2018 showed 9 mm stable nodule in the posterior right upper lobe dating back to August 2017.  Stable and lacks metabolic activity on prior PET.  No evidence of metastatic disease in the thorax.  No evidence of metastatic disease involving the abdomen or pelvis.  Hepatic steatosis. - Colonoscopy done on 10/01/2018 showed the examinee did portion of the ileum was normal.  There was significant looping of the colon.  External and internal hemorrhoids.  No mass in the rectum.  Repeat colonoscopy in 5 years. -CT CAP done on 09/24/2018 showed stable postsurgical changes from right hemo-colectomy with no evidence of anastomotic tumor recurrence.  No findings of metastatic disease.  Questionable mild diffuse bladder wall thickening may be due to to under distention. -Patient was referred to urology but has not kept any of her appointments. - Labs done on 03/30/2019 showed -Patient will return in 6 months with repeat CT CAP and labs.  2.  Breast calcifications: - Patient had a mammogram on 08/2018 that was BI-RADS Category 0 suspicious. - Patient had a right breast biopsy done on 09/23/2018 that showed fibrocystic changes. - She will have a repeat mammogram in 6 months.  3.  Noncompliance: -Patient has a history of being noncompliant with follow-up and referrals. -Patient lives in a group home and is under a caretaker and relies on them for  transportation.

## 2019-03-31 LAB — CEA: CEA: 5.9 ng/mL — ABNORMAL HIGH (ref 0.0–4.7)

## 2019-05-07 ENCOUNTER — Other Ambulatory Visit: Payer: Self-pay

## 2019-05-07 ENCOUNTER — Encounter (HOSPITAL_COMMUNITY): Payer: Self-pay

## 2019-05-07 ENCOUNTER — Observation Stay (HOSPITAL_COMMUNITY)
Admission: EM | Admit: 2019-05-07 | Discharge: 2019-05-09 | Disposition: A | Discharge status: Home / Self Care | Payer: Medicare Other | Attending: Internal Medicine | Admitting: Internal Medicine

## 2019-05-07 DIAGNOSIS — E119 Type 2 diabetes mellitus without complications: Secondary | ICD-10-CM

## 2019-05-07 DIAGNOSIS — C189 Malignant neoplasm of colon, unspecified: Secondary | ICD-10-CM | POA: Diagnosis present

## 2019-05-07 DIAGNOSIS — Z7984 Long term (current) use of oral hypoglycemic drugs: Secondary | ICD-10-CM | POA: Diagnosis not present

## 2019-05-07 DIAGNOSIS — R1013 Epigastric pain: Secondary | ICD-10-CM | POA: Insufficient documentation

## 2019-05-07 DIAGNOSIS — K5900 Constipation, unspecified: Secondary | ICD-10-CM

## 2019-05-07 DIAGNOSIS — E785 Hyperlipidemia, unspecified: Secondary | ICD-10-CM | POA: Diagnosis not present

## 2019-05-07 DIAGNOSIS — F17210 Nicotine dependence, cigarettes, uncomplicated: Secondary | ICD-10-CM | POA: Diagnosis not present

## 2019-05-07 DIAGNOSIS — R001 Bradycardia, unspecified: Secondary | ICD-10-CM | POA: Diagnosis not present

## 2019-05-07 DIAGNOSIS — D509 Iron deficiency anemia, unspecified: Secondary | ICD-10-CM | POA: Diagnosis present

## 2019-05-07 DIAGNOSIS — Z20828 Contact with and (suspected) exposure to other viral communicable diseases: Secondary | ICD-10-CM | POA: Diagnosis not present

## 2019-05-07 DIAGNOSIS — R0789 Other chest pain: Principal | ICD-10-CM | POA: Insufficient documentation

## 2019-05-07 DIAGNOSIS — K219 Gastro-esophageal reflux disease without esophagitis: Secondary | ICD-10-CM | POA: Diagnosis present

## 2019-05-07 DIAGNOSIS — R9431 Abnormal electrocardiogram [ECG] [EKG]: Secondary | ICD-10-CM | POA: Diagnosis not present

## 2019-05-07 DIAGNOSIS — F209 Schizophrenia, unspecified: Secondary | ICD-10-CM | POA: Diagnosis present

## 2019-05-07 DIAGNOSIS — N390 Urinary tract infection, site not specified: Secondary | ICD-10-CM | POA: Diagnosis present

## 2019-05-07 DIAGNOSIS — R079 Chest pain, unspecified: Secondary | ICD-10-CM | POA: Diagnosis present

## 2019-05-07 LAB — CBC WITH DIFFERENTIAL/PLATELET
Abs Immature Granulocytes: 0.02 10*3/uL (ref 0.00–0.07)
Basophils Absolute: 0 10*3/uL (ref 0.0–0.1)
Basophils Relative: 1 %
Eosinophils Absolute: 0.1 10*3/uL (ref 0.0–0.5)
Eosinophils Relative: 3 %
HCT: 37 % (ref 36.0–46.0)
Hemoglobin: 11.3 g/dL — ABNORMAL LOW (ref 12.0–15.0)
Immature Granulocytes: 0 %
Lymphocytes Relative: 43 %
Lymphs Abs: 2.2 10*3/uL (ref 0.7–4.0)
MCH: 25.8 pg — ABNORMAL LOW (ref 26.0–34.0)
MCHC: 30.5 g/dL (ref 30.0–36.0)
MCV: 84.5 fL (ref 80.0–100.0)
Monocytes Absolute: 0.5 10*3/uL (ref 0.1–1.0)
Monocytes Relative: 10 %
Neutro Abs: 2.2 10*3/uL (ref 1.7–7.7)
Neutrophils Relative %: 43 %
Platelets: 176 10*3/uL (ref 150–400)
RBC: 4.38 MIL/uL (ref 3.87–5.11)
RDW: 15.8 % — ABNORMAL HIGH (ref 11.5–15.5)
WBC: 5 10*3/uL (ref 4.0–10.5)
nRBC: 0 % (ref 0.0–0.2)

## 2019-05-07 NOTE — ED Notes (Signed)
ED Provider at bedside. 

## 2019-05-07 NOTE — ED Provider Notes (Signed)
[facility] EMERGENCY DEPARTMENT Provider Note   CSN: [id number] Arrival date & time: 05/07/19  2314     History   Chief Complaint Chief Complaint  Patient presents with   Abdominal Pain    HPI Mary Wells is a 63 y.o. female.     Level 5 caveat for psychiatric illness.  Patient with history of diabetes, adenocarcinoma of the colon status post resection, acid reflux disease and schizophrenia presenting with "pain around my heart".  States this woke her from sleep about 1 hour ago and is improving.  She says it lasted about 30 to 45 minutes and she describes pain and tightness over the left side of her chest near her prior power port.  She had some nausea with this but no vomiting.  Did have an episode of vomiting 2 days ago.  She is states she had a cardiac catheterization 3 years ago but not know the results.  She reports the chest pain did not move anywhere.  There is no vomiting but there was nausea.  There is no sweating.  No shortness of breath, cough or fever.  Denies any abdominal pain or back pain.  She received aspirin by EMS and now feels improved.  The history is provided by the patient and the EMS personnel. The history is limited by the condition of the patient.  Abdominal Pain Associated symptoms: nausea   Associated symptoms: no chest pain, no dysuria, no fatigue, no fever, no hematuria, no shortness of breath and no vomiting     Past Medical History:  Diagnosis Date   Adenocarcinoma of colon ([place])    Allergy    Diabetes mellitus without complication ([place]) [id number]   GERD (gastroesophageal reflux disease)    Iron deficiency anemia 09/18/2013   Schizophrenia ([place])     Patient Active Problem List   Diagnosis Date Noted   Normocytic anemia    Rectal mass 09/30/2018   Pulmonary hypertension, unspecified ([place]) 05/05/2017   Colonic mass    Adenocarcinoma of colon ([place])    HLD (hyperlipidemia) 01/22/2016   GERD (gastroesophageal reflux disease)  01/22/2016   Encounter for screening for cervical cancer  12/21/2015   Iron deficiency anemia 09/18/2013   Diabetes mellitus ([place]) 09/15/2013   Schizophrenia ([place]) 09/15/2013    Past Surgical History:  Procedure Laterality Date   BIOPSY  01/24/2016   Procedure: BIOPSY;  Surgeon: Daneil Dolin, MD;  Location: [facility] END[facility] SU[facility];  Service: Endoscopy;;  cecal mass   COLONOSCOPY  04/21/2006   SLF: limited colonoscopy due to a poor bowel prep    COLONOSCOPY  02/22/2009   SLF: poor bowel prep/Formed stools in the right colon which limited the extent of the exam/The scope was passed to approximately the proximal transverse colon.  No polyps, masses, inflammatory changes, diverticular/normal rectum   COLONOSCOPY WITH PROPOFOL N/A 01/24/2016   Dr. Gala Romney: large fungating, exophytic tumor arising out of the base of the cecum/appendix. Partially obscuring the ileocecal valve   COLONOSCOPY WITH PROPOFOL N/A 10/01/2018   Procedure: COLONOSCOPY WITH PROPOFOL;  Surgeon: Danie Binder, MD;  Location: [facility] END[facility];  Service: Endoscopy;  Laterality: N/A;   EXCISIONAL HEMORRHOIDECTOMY     LIVER BIOPSY N/A 01/29/2016   Procedure: LIVER BIOPSY;  Surgeon: Aviva Signs, MD;  Location: [facility] ORS;  Service: General;  Laterality: N/A;   PARTIAL COLECTOMY N/A 01/29/2016   Procedure: PARTIAL COLECTOMY ;  Surgeon: Aviva Signs, MD;  Location: [facility] ORS;  Service: General;  Laterality: N/A;  PORTACATH PLACEMENT Left 01/29/2016   Procedure: INSERTION PORT-A-CATH;  Surgeon: Aviva Signs, MD;  Location: [facility] ORS;  Service: General;  Laterality: Left;  left subclavian     OB History   No obstetric history on file.      Home Medications    Prior to Admission medications   Medication Sig Start Date End Date Taking? Authorizing Provider  benztropine (COGENTIN) 1 MG tablet Take 1 mg by mouth 2 (two) times daily.    [provider]  clonazePAM (KLONOPIN) 0.5 MG tablet Take 0.5 mg by mouth 2 (two) times daily.      [provider]  divalproex (DEPAKOTE ER) 250 MG 24 hr tablet Take 250 mg by mouth at bedtime.    [provider]  docusate sodium (COLACE) 100 MG capsule Take 100 mg by mouth daily as needed for mild constipation.    [provider]  famotidine (PEPCID) 20 MG tablet Take 20 mg by mouth daily.    [provider]  Ferrous Sulfate (IRON HIGH-POTENCY) 325 MG TABS Take 325 mg by mouth 2 (two) times daily with a meal.    [provider]  fluPHENAZine (PROLIXIN) 5 MG tablet two by mouth two times per day 02/16/19   [provider]  loratadine (CLARITIN) 10 MG tablet Take 10 mg by mouth daily.    [provider]  metFORMIN (GLUCOPHAGE) 500 MG tablet Take 500 mg by mouth 2 (two) times daily with a meal.     [provider]  PSYLLIUM FIBER PO Take 1 capsule by mouth 2 (two) times daily.     [provider]  simvastatin (ZOCOR) 20 MG tablet Take 20 mg by mouth daily.    [provider]    Family History Family History  Problem Relation Age of Onset   Leukemia Paternal Grandfather    Leukemia Cousin    Colon cancer Neg Hx     Social History Social History   Tobacco Use   Smoking status: Current Every Day Smoker    Packs/day: 0.50    Years: 19.00    Pack years: 9.50    Types: Cigarettes   Smokeless tobacco: Never Used   Tobacco comment: 3 cigs daily  Substance Use Topics   Alcohol use: No    Alcohol/week: 0.0 standard drinks   Drug use: No     Allergies   Patient has no known allergies.   Review of Systems Review of Systems  Constitutional: Negative for activity change, appetite change, fatigue and fever.  HENT: Negative for congestion and rhinorrhea.   Eyes: Negative for visual disturbance.  Respiratory: Positive for chest tightness. Negative for shortness of breath.   Cardiovascular: Negative for chest pain.  Gastrointestinal: Positive for abdominal pain and nausea. Negative for  vomiting.  Genitourinary: Negative for dysuria and hematuria.  Musculoskeletal: Negative for arthralgias, back pain and myalgias.  Skin: Negative for rash.  Neurological: Negative for dizziness, weakness and headaches.    all other systems are negative except as noted in the HPI and PMH.    Physical Exam Updated Vital Signs BP (!) 105/54    Pulse (!) 41    Temp 98.7 F (37.1 C) (Oral)    Resp 11    Wt 72.6 kg    SpO2 96%    BMI 27.47 kg/m   Physical Exam Vitals signs and nursing note reviewed.  Constitutional:      General: She is not in acute distress.    Appearance:  She is well-developed.  HENT:     Head: Normocephalic and atraumatic.     Nose: Nose normal.     Mouth/Throat:     Mouth: Mucous membranes are moist.     Pharynx: No oropharyngeal exudate.  Eyes:     Conjunctiva/sclera: Conjunctivae normal.     Pupils: Pupils are equal, round, and reactive to light.  Neck:     Musculoskeletal: Normal range of motion and neck supple.     Comments: No meningismus. Cardiovascular:     Rate and Rhythm: Regular rhythm. Bradycardia present.     Heart sounds: Normal heart sounds. No murmur.  Pulmonary:     Effort: Pulmonary effort is normal. No respiratory distress.     Breath sounds: Normal breath sounds.     Comments: Port in left chest appears nontender and nonerythematous Chest:     Chest wall: Tenderness present.  Abdominal:     Palpations: Abdomen is soft.     Tenderness: There is no abdominal tenderness. There is no guarding or rebound.  Musculoskeletal: Normal range of motion.        General: No tenderness.  Skin:    General: Skin is warm.     Capillary Refill: Capillary refill takes less than 2 seconds.     Findings: No rash.  Neurological:     General: No focal deficit present.     Mental Status: She is alert and oriented to person, place, and time. Mental status is at baseline.     Cranial Nerves: No cranial nerve deficit.     Motor: No abnormal muscle tone.      Coordination: Coordination normal.     Comments: No ataxia on finger to nose bilaterally. No pronator drift. 5/5 strength throughout. CN 2-12 intact.Equal grip strength. Sensation intact.   Psychiatric:        Behavior: Behavior normal.      ED Treatments / Results  Labs (all labs ordered are listed, but only abnormal results are displayed) Labs Reviewed  CBC WITH DIFFERENTIAL/PLATELET - Abnormal; Notable for the following components:      Result Value   Hemoglobin 11.3 (*)    MCH 25.8 (*)    RDW 15.8 (*)    All other components within normal limits  COMPREHENSIVE METABOLIC PANEL - Abnormal; Notable for the following components:   Albumin 3.3 (*)    All other components within normal limits  URINALYSIS, ROUTINE W REFLEX MICROSCOPIC - Abnormal; Notable for the following components:   Hgb urine dipstick SMALL (*)    Nitrite POSITIVE (*)    Leukocytes,Ua MODERATE (*)    WBC, UA >50 (*)    Bacteria, UA RARE (*)    All other components within normal limits  VALPROIC ACID LEVEL - Abnormal; Notable for the following components:   Valproic Acid Lvl 35 (*)    All other components within normal limits  D-DIMER, QUANTITATIVE (NOT AT [facility]) - Abnormal; Notable for the following components:   D-Dimer, Quant 0.99 (*)    All other components within normal limits  CBC - Abnormal; Notable for the following components:   Hemoglobin 11.6 (*)    MCH 25.4 (*)    RDW 15.9 (*)    All other components within normal limits  SARS CORONAVIRUS 2 (TAT 6-24 HRS)  URINE CULTURE  LIPASE, BLOOD  COMPREHENSIVE METABOLIC PANEL  HEMOGLOBIN A1C  CBG MONITORING, ED  TROPONIN I (HIGH SENSITIVITY)  TROPONIN I (HIGH SENSITIVITY)  TROPONIN I (HIGH SENSITIVITY)  EKG EKG Interpretation  Date/Time:  Friday May 07 2019 23:26:42 EDT Ventricular Rate:  44 PR Interval:    QRS Duration: 120 QT Interval:  498 QTC Calculation: 426 R Axis:   -38 Text Interpretation:  Sinus bradycardia Nonspecific IVCD  with LAD Inferior infarct, old Probable anterior infarct, age indeterminate Baseline wander in lead(s) I II aVR Artifact T wave inversions seen in 2017 Confirmed by Ezequiel Essex [phone]) on 05/07/2019 11:46:06 PM   Radiology Ct Angio Chest Pe W And/or Wo Contrast  Result Date: 05/08/2019 CLINICAL DATA:  Sharp epigastric abdominal pain. Elevated D-dimer. EXAM: CT ANGIOGRAPHY CHEST CT ABDOMEN AND PELVIS WITH CONTRAST TECHNIQUE: Multidetector CT imaging of the chest was performed using the standard protocol during bolus administration of intravenous contrast. Multiplanar CT image reconstructions and MIPs were obtained to evaluate the vascular anatomy. Multidetector CT imaging of the abdomen and pelvis was performed using the standard protocol during bolus administration of intravenous contrast. CONTRAST:  112mL OMNIPAQUE IOHEXOL 350 MG/ML SOLN COMPARISON:  None. FINDINGS: CTA CHEST FINDINGS Cardiovascular: Contrast injection is sufficient to demonstrate satisfactory opacification of the pulmonary arteries to the segmental level. There is no pulmonary embolus. The main pulmonary artery is enlarged, measuring 3.4 cm at the bifurcation. There is no CT evidence of acute right heart strain. There is mild calcific aortic atherosclerosis. There is a normal 3-vessel arch branching pattern. Heart size is mildly enlarged. No pericardial effusion. Mediastinum/Nodes: No mediastinal, hilar or axillary lymphadenopathy. The visualized thyroid and thoracic esophageal course are unremarkable. Lungs/Pleura: 9 mm right upper lobe nodule. No pleural effusion or pneumothorax. No focal airspace consolidation. No focal pleural abnormality. Musculoskeletal: No chest wall abnormality. No acute or significant osseous findings. Review of the MIP images confirms the above findings. CT ABDOMEN and PELVIS FINDINGS Hepatobiliary: Normal hepatic contours and density. No visible biliary dilatation. Normal gallbladder. Pancreas: Normal contours  without ductal dilatation. No peripancreatic fluid collection. Spleen: Normal. Adrenals/Urinary Tract: --Adrenal glands: Normal. --Right kidney/ureter: Multiple subcentimeter cysts. --Left kidney/ureter: Multiple subcentimeter cysts. --Urinary bladder: Distended urinary bladder. Stomach/Bowel: --Stomach/Duodenum: No hiatal hernia or other gastric abnormality. Normal duodenal course and caliber. --Small bowel: No dilatation or inflammation. --Colon: No focal abnormality. Right upper quadrant colonic postsurgical change. --Appendix: Not visualized. No right lower quadrant inflammation or free fluid. Vascular/Lymphatic: Atherosclerotic calcification is present within the non-aneurysmal abdominal aorta, without hemodynamically significant stenosis. No abdominal or pelvic lymphadenopathy. Reproductive: Normal uterus and ovaries. Musculoskeletal. No bony spinal canal stenosis or focal osseous abnormality. Other: None. IMPRESSION: 1. No pulmonary embolus or acute aortic syndrome. 2. Enlarged main pulmonary artery, which may indicate underlying pulmonary hypertension. 3. No acute abdominal or pelvic abnormality. 4. 9 mm right upper lobe pulmonary nodule, unchanged as far back as 01/26/2016 and therefore benign. 5. Aortic Atherosclerosis (ICD10-I70.0). Electronically Signed   By: Ulyses Jarred M.D.   On: 05/08/2019 03:07   Ct Abdomen Pelvis W Contrast  Result Date: 05/08/2019 CLINICAL DATA:  Sharp epigastric abdominal pain. Elevated D-dimer. EXAM: CT ANGIOGRAPHY CHEST CT ABDOMEN AND PELVIS WITH CONTRAST TECHNIQUE: Multidetector CT imaging of the chest was performed using the standard protocol during bolus administration of intravenous contrast. Multiplanar CT image reconstructions and MIPs were obtained to evaluate the vascular anatomy. Multidetector CT imaging of the abdomen and pelvis was performed using the standard protocol during bolus administration of intravenous contrast. CONTRAST:  149mL OMNIPAQUE IOHEXOL 350  MG/ML SOLN COMPARISON:  None. FINDINGS: CTA CHEST FINDINGS Cardiovascular: Contrast injection is sufficient to demonstrate satisfactory opacification of the pulmonary arteries  to the segmental level. There is no pulmonary embolus. The main pulmonary artery is enlarged, measuring 3.4 cm at the bifurcation. There is no CT evidence of acute right heart strain. There is mild calcific aortic atherosclerosis. There is a normal 3-vessel arch branching pattern. Heart size is mildly enlarged. No pericardial effusion. Mediastinum/Nodes: No mediastinal, hilar or axillary lymphadenopathy. The visualized thyroid and thoracic esophageal course are unremarkable. Lungs/Pleura: 9 mm right upper lobe nodule. No pleural effusion or pneumothorax. No focal airspace consolidation. No focal pleural abnormality. Musculoskeletal: No chest wall abnormality. No acute or significant osseous findings. Review of the MIP images confirms the above findings. CT ABDOMEN and PELVIS FINDINGS Hepatobiliary: Normal hepatic contours and density. No visible biliary dilatation. Normal gallbladder. Pancreas: Normal contours without ductal dilatation. No peripancreatic fluid collection. Spleen: Normal. Adrenals/Urinary Tract: --Adrenal glands: Normal. --Right kidney/ureter: Multiple subcentimeter cysts. --Left kidney/ureter: Multiple subcentimeter cysts. --Urinary bladder: Distended urinary bladder. Stomach/Bowel: --Stomach/Duodenum: No hiatal hernia or other gastric abnormality. Normal duodenal course and caliber. --Small bowel: No dilatation or inflammation. --Colon: No focal abnormality. Right upper quadrant colonic postsurgical change. --Appendix: Not visualized. No right lower quadrant inflammation or free fluid. Vascular/Lymphatic: Atherosclerotic calcification is present within the non-aneurysmal abdominal aorta, without hemodynamically significant stenosis. No abdominal or pelvic lymphadenopathy. Reproductive: Normal uterus and ovaries.  Musculoskeletal. No bony spinal canal stenosis or focal osseous abnormality. Other: None. IMPRESSION: 1. No pulmonary embolus or acute aortic syndrome. 2. Enlarged main pulmonary artery, which may indicate underlying pulmonary hypertension. 3. No acute abdominal or pelvic abnormality. 4. 9 mm right upper lobe pulmonary nodule, unchanged as far back as 01/26/2016 and therefore benign. 5. Aortic Atherosclerosis (ICD10-I70.0). Electronically Signed   By: Ulyses Jarred M.D.   On: 05/08/2019 03:07   Dg Abdomen Acute W/chest  Result Date: 05/08/2019 CLINICAL DATA:  Abdominal pain EXAM: DG ABDOMEN ACUTE W/ 1V CHEST COMPARISON:  CT chest September 24, 2018 FINDINGS: There is a moderate to large amount of colonic stool throughout. Mildly prominent air-filled loops of splenic colon is seen. There is air to the level of the rectum. No radiopaque calculi or other significant radiographic abnormality is seen. Heart size and mediastinal contours are within normal limits. Again noted is an 8 mm pulmonary nodule in the right upper lung. IMPRESSION: Moderate to large amount of colonic stool. However no definite evidence of obstruction. Stable 8 mm pulmonary nodule in the right upper lung. Electronically Signed   By: Prudencio Pair M.D.   On: 05/08/2019 00:41    Procedures Procedures (including critical care time)  Medications Ordered in ED Medications - No data to display   Initial Impression / Assessment and Plan / ED Course  I have reviewed the triage vital signs and the nursing notes.  Pertinent labs & imaging results that were available during my care of the patient were reviewed by me and considered in my medical decision making (see chart for details).       Episode of "pain around my heart which is since improved".  EKG is sinus bradycardia in the 40s.  She does have T wave inversions inferior laterally which was not seen in 2019 but was present in 2017.  Chest pain has resolved.  Interestingly she states  the site of the pain is near her Port-A-Cath but it is not painful to palpation.  Initial troponin is negative.  D-dimer is positive but subsequent CT PE study shows no pulmonary embolism.  Stable lung nodule.  Heart score is 5.  Remains bradycardic  in the 62s and 40s but this appears to be chronic.  She is not on any AV nodal blockers.  Given her EKG changes and elevated heart score coupled with her persistent bradycardia, observation admission discussed with Dr. Maudie Mercury. Rocephin for UTI, culture sent.    Final Clinical Impressions(s) / ED Diagnoses   Final diagnoses:  Chest pain, unspecified type  Sinus bradycardia  Abnormal EKG    ED Discharge Orders    None       Jerome Viglione, Annie Main, MD 05/08/19 [phone]

## 2019-05-07 NOTE — ED Triage Notes (Signed)
Ems called out to [facility] for chest pain. Upon arrival and patient was complaining of abd pain. Pt HR was bradycardic with ems. In the 40-50s. Last BP 115/44. 20g l ac. 186ml NS

## 2019-05-08 ENCOUNTER — Emergency Department (HOSPITAL_COMMUNITY): Payer: Medicare Other

## 2019-05-08 ENCOUNTER — Observation Stay (HOSPITAL_BASED_OUTPATIENT_CLINIC_OR_DEPARTMENT_OTHER): Payer: Medicare Other

## 2019-05-08 ENCOUNTER — Encounter (HOSPITAL_COMMUNITY): Payer: Self-pay

## 2019-05-08 DIAGNOSIS — F209 Schizophrenia, unspecified: Secondary | ICD-10-CM

## 2019-05-08 DIAGNOSIS — R001 Bradycardia, unspecified: Secondary | ICD-10-CM | POA: Diagnosis not present

## 2019-05-08 DIAGNOSIS — N390 Urinary tract infection, site not specified: Secondary | ICD-10-CM | POA: Diagnosis not present

## 2019-05-08 DIAGNOSIS — R079 Chest pain, unspecified: Secondary | ICD-10-CM | POA: Diagnosis not present

## 2019-05-08 LAB — TROPONIN I (HIGH SENSITIVITY)
Troponin I (High Sensitivity): 2 ng/L (ref <18)
Troponin I (High Sensitivity): 3 ng/L (ref <18)
Troponin I (High Sensitivity): 3 ng/L (ref <18)
Troponin I (High Sensitivity): <2 ng/L (ref <18)

## 2019-05-08 LAB — COMPREHENSIVE METABOLIC PANEL
ALT: 13 U/L (ref 0–44)
ALT: 13 U/L (ref 0–44)
AST: 18 U/L (ref 15–41)
AST: 19 U/L (ref 15–41)
Albumin: 3.3 g/dL — ABNORMAL LOW (ref 3.5–5.0)
Albumin: 3.3 g/dL — ABNORMAL LOW (ref 3.5–5.0)
Alkaline Phosphatase: 47 U/L (ref 38–126)
Alkaline Phosphatase: 48 U/L (ref 38–126)
Anion gap: 5 (ref 5–15)
Anion gap: 8 (ref 5–15)
BUN: 14 mg/dL (ref 8–23)
BUN: 16 mg/dL (ref 8–23)
CO2: 26 mmol/L (ref 22–32)
CO2: 27 mmol/L (ref 22–32)
Calcium: 9 mg/dL (ref 8.9–10.3)
Calcium: 9 mg/dL (ref 8.9–10.3)
Chloride: 105 mmol/L (ref 98–111)
Chloride: 108 mmol/L (ref 98–111)
Creatinine, Ser: 0.72 mg/dL (ref 0.44–1.00)
Creatinine, Ser: 0.79 mg/dL (ref 0.44–1.00)
GFR calc Af Amer: >60 mL/min (ref >60)
GFR calc Af Amer: >60 mL/min (ref >60)
GFR calc non Af Amer: >60 mL/min (ref >60)
GFR calc non Af Amer: >60 mL/min (ref >60)
Glucose, Bld: 111 mg/dL — ABNORMAL HIGH (ref 70–99)
Glucose, Bld: 93 mg/dL (ref 70–99)
Potassium: 4 mmol/L (ref 3.5–5.1)
Potassium: 4.3 mmol/L (ref 3.5–5.1)
Sodium: 139 mmol/L (ref 135–145)
Sodium: 140 mmol/L (ref 135–145)
Total Bilirubin: 0.3 mg/dL (ref 0.3–1.2)
Total Bilirubin: 0.3 mg/dL (ref 0.3–1.2)
Total Protein: 6.8 g/dL (ref 6.5–8.1)
Total Protein: 6.9 g/dL (ref 6.5–8.1)

## 2019-05-08 LAB — CBG MONITORING, ED
Glucose-Capillary: 75 mg/dL (ref 70–99)
Glucose-Capillary: 78 mg/dL (ref 70–99)
Glucose-Capillary: 100 mg/dL — ABNORMAL HIGH (ref 70–99)

## 2019-05-08 LAB — CBC
HCT: 38.3 % (ref 36.0–46.0)
Hemoglobin: 11.6 g/dL — ABNORMAL LOW (ref 12.0–15.0)
MCH: 25.4 pg — ABNORMAL LOW (ref 26.0–34.0)
MCHC: 30.3 g/dL (ref 30.0–36.0)
MCV: 84 fL (ref 80.0–100.0)
Platelets: 172 10*3/uL (ref 150–400)
RBC: 4.56 MIL/uL (ref 3.87–5.11)
RDW: 15.9 % — ABNORMAL HIGH (ref 11.5–15.5)
WBC: 4.8 10*3/uL (ref 4.0–10.5)
nRBC: 0 % (ref 0.0–0.2)

## 2019-05-08 LAB — URINALYSIS, ROUTINE W REFLEX MICROSCOPIC
Bilirubin Urine: NEGATIVE
Glucose, UA: NEGATIVE mg/dL
Ketones, ur: NEGATIVE mg/dL
Nitrite: POSITIVE — AB
Protein, ur: NEGATIVE mg/dL
Specific Gravity, Urine: 1.005 (ref 1.005–1.030)
WBC, UA: >50 WBC/hpf — ABNORMAL HIGH (ref 0–5)
pH: 6 (ref 5.0–8.0)

## 2019-05-08 LAB — D-DIMER, QUANTITATIVE: D-Dimer, Quant: 0.99 ug/mL-FEU — ABNORMAL HIGH (ref 0.00–0.50)

## 2019-05-08 LAB — TSH: TSH: 0.42 u[IU]/mL (ref 0.350–4.500)

## 2019-05-08 LAB — ECHOCARDIOGRAM COMPLETE: Weight: 2560.86 oz

## 2019-05-08 LAB — HEMOGLOBIN A1C
Hgb A1c MFr Bld: 6.4 % — ABNORMAL HIGH (ref 4.8–5.6)
Mean Plasma Glucose: 136.98 mg/dL

## 2019-05-08 LAB — GLUCOSE, CAPILLARY
Glucose-Capillary: 154 mg/dL — ABNORMAL HIGH (ref 70–99)
Glucose-Capillary: 123 mg/dL — ABNORMAL HIGH (ref 70–99)

## 2019-05-08 LAB — VALPROIC ACID LEVEL: Valproic Acid Lvl: 35 ug/mL — ABNORMAL LOW (ref 50.0–100.0)

## 2019-05-08 LAB — SARS CORONAVIRUS 2 (TAT 6-24 HRS): SARS Coronavirus 2: NEGATIVE

## 2019-05-08 LAB — LIPASE, BLOOD: Lipase: 21 U/L (ref 11–51)

## 2019-05-08 MED ORDER — ACETAMINOPHEN 650 MG RE SUPP: 650.0000 mg | Freq: Four times a day (QID) | RECTAL | Status: DC | PRN

## 2019-05-08 MED ORDER — LORATADINE 10 MG PO TABS
10.0000 mg | ORAL_TABLET | Freq: Every day | ORAL | Status: DC
  Administered 2019-05-08: 10 mg via ORAL
  Filled 2019-05-08 (×2): qty 1

## 2019-05-08 MED ORDER — INSULIN ASPART 100 UNIT/ML ~~LOC~~ SOLN: 0.0000 [IU] | Freq: Every day | SUBCUTANEOUS | Status: DC

## 2019-05-08 MED ORDER — PSYLLIUM 95 % PO PACK
PACK | Freq: Two times a day (BID) | ORAL | Status: DC
  Administered 2019-05-09: 1 via ORAL
  Filled 2019-05-08 (×6): qty 1

## 2019-05-08 MED ORDER — FAMOTIDINE 20 MG PO TABS
20.0000 mg | ORAL_TABLET | Freq: Every day | ORAL | Status: DC
  Administered 2019-05-08: 20 mg via ORAL
  Filled 2019-05-08: qty 1

## 2019-05-08 MED ORDER — CLONAZEPAM 0.5 MG PO TABS
0.5000 mg | ORAL_TABLET | Freq: Two times a day (BID) | ORAL | Status: DC
  Administered 2019-05-08: 0.5 mg via ORAL
  Filled 2019-05-08 (×2): qty 1

## 2019-05-08 MED ORDER — IOHEXOL 350 MG/ML SOLN
100.0000 mL | Freq: Once | INTRAVENOUS | Status: AC | PRN
  Administered 2019-05-08: 100 mL via INTRAVENOUS

## 2019-05-08 MED ORDER — SODIUM CHLORIDE 0.9 % IV SOLN
INTRAVENOUS | Status: DC
  Administered 2019-05-08: 04:00:00 via INTRAVENOUS

## 2019-05-08 MED ORDER — FLUPHENAZINE HCL 5 MG PO TABS
5.0000 mg | ORAL_TABLET | Freq: Two times a day (BID) | ORAL | Status: DC
  Administered 2019-05-08: 5 mg via ORAL
  Filled 2019-05-08 (×5): qty 1

## 2019-05-08 MED ORDER — PANTOPRAZOLE SODIUM 40 MG PO TBEC
40.0000 mg | DELAYED_RELEASE_TABLET | Freq: Two times a day (BID) | ORAL | Status: DC
  Administered 2019-05-08: 23:00:00 40 mg via ORAL
  Filled 2019-05-08 (×2): qty 1

## 2019-05-08 MED ORDER — LINACLOTIDE 145 MCG PO CAPS
145.0000 ug | ORAL_CAPSULE | Freq: Every day | ORAL | Status: DC
  Administered 2019-05-09: 08:00:00 145 ug via ORAL
  Filled 2019-05-08 (×3): qty 1

## 2019-05-08 MED ORDER — SIMVASTATIN 20 MG PO TABS
20.0000 mg | ORAL_TABLET | Freq: Every day | ORAL | Status: DC
  Administered 2019-05-08: 20 mg via ORAL
  Filled 2019-05-08 (×2): qty 1

## 2019-05-08 MED ORDER — SODIUM CHLORIDE 0.9 % IV SOLN
1.0000 g | Freq: Once | INTRAVENOUS | Status: AC
  Administered 2019-05-08: 1 g via INTRAVENOUS
  Filled 2019-05-08: qty 10

## 2019-05-08 MED ORDER — ACETAMINOPHEN 325 MG PO TABS: 650.0000 mg | ORAL_TABLET | Freq: Four times a day (QID) | ORAL | Status: DC | PRN

## 2019-05-08 MED ORDER — DOCUSATE SODIUM 100 MG PO CAPS: 100.0000 mg | ORAL_CAPSULE | Freq: Every day | ORAL | Status: DC | PRN

## 2019-05-08 MED ORDER — ENOXAPARIN SODIUM 40 MG/0.4ML ~~LOC~~ SOLN
40.0000 mg | SUBCUTANEOUS | Status: DC
  Filled 2019-05-08: qty 0.4

## 2019-05-08 MED ORDER — INSULIN ASPART 100 UNIT/ML ~~LOC~~ SOLN: 0.0000 [IU] | SUBCUTANEOUS | Status: DC

## 2019-05-08 MED ORDER — DIVALPROEX SODIUM ER 250 MG PO TB24
250.0000 mg | ORAL_TABLET | Freq: Every day | ORAL | Status: DC
  Administered 2019-05-08: 250 mg via ORAL
  Filled 2019-05-08 (×3): qty 1

## 2019-05-08 MED ORDER — BENZTROPINE MESYLATE 1 MG PO TABS
1.0000 mg | ORAL_TABLET | Freq: Two times a day (BID) | ORAL | Status: DC
  Administered 2019-05-08 – 2019-05-09 (×2): 1 mg via ORAL
  Filled 2019-05-08 (×2): qty 1

## 2019-05-08 NOTE — ED Notes (Signed)
Updated pt's group home.

## 2019-05-08 NOTE — ED Notes (Signed)
CBG 78.  Meal tray given.

## 2019-05-08 NOTE — H&P (Signed)
TRH H&P    Patient Demographics:    Mary Wells, is a 63 y.o. female  MRN: [id number]  DOB - [birth year]  Admit Date - 05/07/2019  Referring MD/NP/PA:  Wyvonnia Dusky,   Outpatient Primary MD for the patient is Rosita Fire, MD  Patient coming from:  ALF  Chief complaint- chest pain    HPI:    Mary Wells  is a 63 y.o. female, Schizophrenia, [name], Iron deficiency anemia, Adenocarcinoma of the colon, Dm2 presents with c/o chest pain left sided starting last nite. Pt states pain is near portacath site. Lasted for about 30 minutes,  And went away by itself.  It was not associated with fever, chills, cough, palp, sob, n/v.    In ED,  T 98.7, P 42, R 10, Bp 113/57  Pox 100% on RA Wt 72.6kg  CTA chest IMPRESSION: 1. No pulmonary embolus or acute aortic syndrome. 2. Enlarged main pulmonary artery, which may indicate underlying pulmonary hypertension. 3. No acute abdominal or pelvic abnormality. 4. 9 mm right upper lobe pulmonary nodule, unchanged as far back as 01/26/2016 and therefore benign. 5. Aortic Atherosclerosis (ICD10-I70.0).  Urinalysis wbc >50  Na 140, K 4.3,  Bun 14, Creatinine 0.72 Alb 3.3 Ast 19, Alt 13 Wbc 4.8, Hgb 11.6, Plt 172  Trop  3  Ekg nsr at 44, nl axis, t inversion  In 3, avf, v1-3 (old, present in 2017)  Pt will be admitted for chest pain and bradycardia.    Review of systems:    In addition to the HPI above,  No Fever-chills, No Headache, No changes with Vision or hearing, No problems swallowing food or Liquids, No Cough or Shortness of Breath, No Abdominal pain, No Nausea or Vomiting, bowel movements are regular, No Blood in stool or Urine, No dysuria, No new skin rashes or bruises, No new joints pains-aches,  No new weakness, tingling, numbness in any extremity, No recent weight gain or loss, No polyuria, polydypsia or polyphagia, No significant Mental  Stressors.  All other systems reviewed and are negative.    Past History of the following :    Past Medical History:  Diagnosis Date   Adenocarcinoma of colon ([place])    Allergy    Diabetes mellitus without complication ([place]) [id number]   GERD (gastroesophageal reflux disease)    Iron deficiency anemia 09/18/2013   Schizophrenia ([place])       Past Surgical History:  Procedure Laterality Date   BIOPSY  01/24/2016   Procedure: BIOPSY;  Surgeon: Daneil Dolin, MD;  [facility];  Service: Endoscopy;;  cecal mass   COLONOSCOPY  04/21/2006   SLF: limited colonoscopy due to a poor bowel prep    COLONOSCOPY  02/22/2009   SLF: poor bowel prep/Formed stools in the right colon which limited the extent of the exam/The scope was passed to approximately the proximal transverse colon.  No polyps, masses, inflammatory changes, diverticular/normal rectum   COLONOSCOPY WITH PROPOFOL N/A 01/24/2016   Dr. 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 Partially obscuring the ileocecal valve   COLONOSCOPY WITH PROPOFOL N/A 10/01/2018   Procedure: COLONOSCOPY WITH PROPOFOL;  Surgeon: Danie Binder, MD;  Location: [facility];  Service: Endoscopy;  Laterality: N/A;   EXCISIONAL HEMORRHOIDECTOMY     LIVER BIOPSY N/A 01/29/2016   Procedure: LIVER BIOPSY;  Surgeon: Aviva Signs, MD;  Location: [facility];  Service: General;  Laterality: N/A;   PARTIAL COLECTOMY N/A 01/29/2016   Procedure: PARTIAL COLECTOMY ;  Surgeon: Aviva Signs, MD;  Location: [facility];  Service: General;  Laterality: N/A;   PORTACATH PLACEMENT Left 01/29/2016   Procedure: INSERTION PORT-A-CATH;  Surgeon: Aviva Signs, MD;  Location: [facility];  Service: General;  Laterality: Left;  left subclavian      Social History:      Social History   Tobacco Use   Smoking status: Current Every Day Smoker    Packs/day: 0.50    Years: 19.00    Pack years: 9.50    Types: Cigarettes    Smokeless tobacco: Never Used   Tobacco comment: 3 cigs daily  Substance Use Topics   Alcohol use: No    Alcohol/week: 0.0 standard drinks       Family History :     Family History  Problem Relation Age of Onset   Leukemia Paternal Grandfather    Leukemia Cousin    Colon cancer Neg Hx        Home Medications:   Prior to Admission medications   Medication Sig Start Date End Date Taking? Authorizing Provider  benztropine (COGENTIN) 1 MG tablet Take 1 mg by mouth 2 (two) times daily.    [provider]  clonazePAM (KLONOPIN) 0.5 MG tablet Take 0.5 mg by mouth 2 (two) times daily.     [provider]  divalproex (DEPAKOTE ER) 250 MG 24 hr tablet Take 250 mg by mouth at bedtime.    [provider]  docusate sodium (COLACE) 100 MG capsule Take 100 mg by mouth daily as needed for mild constipation.    [provider]  famotidine (PEPCID) 20 MG tablet Take 20 mg by mouth daily.    [provider]  Ferrous Sulfate (IRON HIGH-POTENCY) 325 MG TABS Take 325 mg by mouth 2 (two) times daily with a meal.    [provider]  fluPHENAZine (PROLIXIN) 5 MG tablet two by mouth two times per day 02/16/19   [provider]  loratadine (CLARITIN) 10 MG tablet Take 10 mg by mouth daily.    [provider]  metFORMIN (GLUCOPHAGE) 500 MG tablet Take 500 mg by mouth 2 (two) times daily with a meal.     [provider]  PSYLLIUM FIBER PO Take 1 capsule by mouth 2 (two) times daily.     [provider]  simvastatin (ZOCOR) 20 MG tablet Take 20 mg by mouth daily.    [provider]     Allergies:    No Known Allergies   Physical Exam:   Vitals  Blood pressure (!) 102/57, pulse (!) 47, temperature 98.7 F (37.1 C), temperature source Oral, resp. rate 10, weight 72.6 kg, SpO2 100 %.  1.  General: Axoxo1(person)  2. Psychiatric: euthymic  3. Neurologic: nonfocal  4. HEENMT:  Anicteric,  pupils 1.82mm symmetric, direct, consensual, near intact Neck: no jvd  5. Respiratory : CTAB  6. Cardiovascular : Bradycardic s1, s2,   7. Gastrointestinal:  Abd: soft,  Nt, nd, +bs  8. Skin:  Ext: no  c/c/e, no rash  9.Musculoskeletal:  Good ROM    Data Review:    CBC Recent Labs  Lab 05/07/19 2348 05/08/19 0435  WBC 5.0 4.8  HGB 11.3* 11.6*  HCT 37.0 38.3  PLT 176 172  MCV 84.5 84.0  MCH 25.8* 25.4*  MCHC 30.5 30.3  RDW 15.8* 15.9*  LYMPHSABS 2.2  --   MONOABS 0.5  --   EOSABS 0.1  --   BASOSABS 0.0  --    ------------------------------------------------------------------------------------------------------------------  Results for orders placed or performed during the hospital encounter of 05/07/19 (from the past 48 hour(s))  CBC with Differential/Platelet     Status: Abnormal   Collection Time: 05/07/19 11:48 PM  Result Value Ref Range   WBC 5.0 4.0 - 10.5 K/uL   RBC 4.38 3.87 - 5.11 MIL/uL   Hemoglobin 11.3 (L) 12.0 - 15.0 g/dL   HCT 37.0 36.0 - 46.0 %   MCV 84.5 80.0 - 100.0 fL   MCH 25.8 (L) 26.0 - 34.0 pg   MCHC 30.5 30.0 - 36.0 g/dL   RDW 15.8 (H) 11.5 - 15.5 %   Platelets 176 150 - 400 K/uL   nRBC 0.0 0.0 - 0.2 %   Neutrophils Relative % 43 %   Neutro Abs 2.2 1.7 - 7.7 K/uL   Lymphocytes Relative 43 %   Lymphs Abs 2.2 0.7 - 4.0 K/uL   Monocytes Relative 10 %   Monocytes Absolute 0.5 0.1 - 1.0 K/uL   Eosinophils Relative 3 %   Eosinophils Absolute 0.1 0.0 - 0.5 K/uL   Basophils Relative 1 %   Basophils Absolute 0.0 0.0 - 0.1 K/uL   Immature Granulocytes 0 %   Abs Immature Granulocytes 0.02 0.00 - 0.07 K/uL    Comment: Performed at [facility], [address] [place], [place] [address]  Comprehensive metabolic panel     Status: Abnormal   Collection Time: 05/07/19 11:48 PM  Result Value Ref Range   Sodium 139 135 - 145 mmol/L   Potassium 4.0 3.5 - 5.1 mmol/L   Chloride 105 98 - 111 mmol/L   CO2 26 22 - 32 mmol/L   Glucose, Bld 93 70 -  99 mg/dL   BUN 16 8 - 23 mg/dL   Creatinine, Ser 0.79 0.44 - 1.00 mg/dL   Calcium 9.0 8.9 - 10.3 mg/dL   Total Protein 6.8 6.5 - 8.1 g/dL   Albumin 3.3 (L) 3.5 - 5.0 g/dL   AST 18 15 - 41 U/L   ALT 13 0 - 44 U/L   Alkaline Phosphatase 47 38 - 126 U/L   Total Bilirubin 0.3 0.3 - 1.2 mg/dL   GFR calc non Af Amer >60 >60 mL/min   GFR calc Af Amer >60 >60 mL/min   Anion gap 8 5 - 15    Comment: Performed at [facility], [address], [place] , [place] [address]  Lipase, blood     Status: None   Collection Time: 05/07/19 11:48 PM  Result Value Ref Range   Lipase 21 11 - 51 U/L    Comment: Performed at [facility], [address], [place],  [address]  Troponin I (High Sensitivity)     Status: None   Collection Time: 05/07/19 11:48 PM  Result Value Ref Range   Troponin I (High Sensitivity) <2.0 <18 ng/L    Comment: Performed at [facility], [address] [place], [place] [address]  Valproic acid level     Status: Abnormal  Collection Time: 05/07/19 11:48 PM  Result Value Ref Range   Valproic Acid Lvl 35 (L) 50.0 - 100.0 ug/mL    Comment: Performed at [facility], [address] [place], [place] [address]  D-dimer, quantitative (not at [facility])     Status: Abnormal   Collection Time: 05/07/19 11:48 PM  Result Value Ref Range   D-Dimer, Quant 0.99 (H) 0.00 - 0.50 ug/mL-FEU    Comment: (NOTE) At the manufacturer cut-off of 0.50 ug/mL FEU, this assay has been documented to exclude PE with a sensitivity and negative predictive value of 97 to 99%.  At this time, this assay has not been approved by the FDA to exclude DVT/VTE. Results should be correlated with clinical presentation. Performed at [facility], [address], [place], [place] [address]   Troponin I (High Sensitivity)     Status: None   Collection Time: 05/08/19  1:16 AM  Result Value Ref Range   Troponin I (High Sensitivity) 2 <18 ng/L    Comment: (NOTE) Elevated high sensitivity troponin I (hsTnI) values and  significant  changes across serial measurements may suggest ACS but many other  chronic and acute conditions are known to elevate hsTnI results.  Refer to the "Links" section for chest pain algorithms and additional  guidance. Performed at [facility], [address] [place], [place] [address]   CBG monitoring, ED     Status: None   Collection Time: 05/08/19  3:54 AM  Result Value Ref Range   Glucose-Capillary 75 70 - 99 mg/dL  Urinalysis, Routine w reflex microscopic     Status: Abnormal   Collection Time: 05/08/19  3:55 AM  Result Value Ref Range   Color, Urine YELLOW YELLOW   APPearance CLEAR CLEAR   Specific Gravity, Urine 1.005 1.005 - 1.030   pH 6.0 5.0 - 8.0   Glucose, UA NEGATIVE NEGATIVE mg/dL   Hgb urine dipstick SMALL (A) NEGATIVE   Bilirubin Urine NEGATIVE NEGATIVE   Ketones, ur NEGATIVE NEGATIVE mg/dL   Protein, ur NEGATIVE NEGATIVE mg/dL   Nitrite POSITIVE (A) NEGATIVE   Leukocytes,Ua MODERATE (A) NEGATIVE   RBC / HPF 0-5 0 - 5 RBC/hpf   WBC, UA >50 (H) 0 - 5 WBC/hpf   Bacteria, UA RARE (A) NONE SEEN   Squamous Epithelial / LPF 0-5 0 - 5    Comment: Performed at [facility], [address], [place], [place] [address]  Troponin I (High Sensitivity)     Status: None   Collection Time: 05/08/19  4:34 AM  Result Value Ref Range   Troponin I (High Sensitivity) 3 <18 ng/L    Comment: (NOTE) Elevated high sensitivity troponin I (hsTnI) values and significant  changes across serial measurements may suggest ACS but many other  chronic and acute conditions are known to elevate hsTnI results.  Refer to the "Links" section for chest pain algorithms and additional  guidance. Performed at [facility], [address], [place], [place] [address]   Comprehensive metabolic panel     Status: Abnormal   Collection Time: 05/08/19  4:35 AM  Result Value Ref Range   Sodium 140 135 - 145 mmol/L   Potassium 4.3 3.5 - 5.1 mmol/L   Chloride 108 98 - 111 mmol/L   CO2 27 22 - 32  mmol/L   Glucose, Bld 111 (H) 70 - 99 mg/dL   BUN 14 8 - 23 mg/dL   Creatinine, Ser 0.72 0.44 - 1.00 mg/dL   Calcium 9.0 8.9 -  10.3 mg/dL   Total Protein 6.9 6.5 - 8.1 g/dL   Albumin 3.3 (L) 3.5 - 5.0 g/dL   AST 19 15 - 41 U/L   ALT 13 0 - 44 U/L   Alkaline Phosphatase 48 38 - 126 U/L   Total Bilirubin 0.3 0.3 - 1.2 mg/dL   GFR calc non Af Amer >60 >60 mL/min   GFR calc Af Amer >60 >60 mL/min   Anion gap 5 5 - 15    Comment: Performed at [facility], [address], [place], [place] [address]  CBC     Status: Abnormal   Collection Time: 05/08/19  4:35 AM  Result Value Ref Range   WBC 4.8 4.0 - 10.5 K/uL   RBC 4.56 3.87 - 5.11 MIL/uL   Hemoglobin 11.6 (L) 12.0 - 15.0 g/dL   HCT 38.3 36.0 - 46.0 %   MCV 84.0 80.0 - 100.0 fL   MCH 25.4 (L) 26.0 - 34.0 pg   MCHC 30.3 30.0 - 36.0 g/dL   RDW 15.9 (H) 11.5 - 15.5 %   Platelets 172 150 - 400 K/uL   nRBC 0.0 0.0 - 0.2 %    Comment: Performed at [facility], [address], [place], [place] [address]    Chemistries  Recent Labs  Lab 05/07/19 2348 05/08/19 0435  NA 139 140  K 4.0 4.3  CL 105 108  CO2 26 27  GLUCOSE 93 111*  BUN 16 14  CREATININE 0.79 0.72  CALCIUM 9.0 9.0  AST 18 19  ALT 13 13  ALKPHOS 47 48  BILITOT 0.3 0.3   ------------------------------------------------------------------------------------------------------------------  ------------------------------------------------------------------------------------------------------------------ GFR: CrCl cannot be calculated (Unknown ideal weight.). Liver Function Tests: Recent Labs  Lab 05/07/19 2348 05/08/19 0435  AST 18 19  ALT 13 13  ALKPHOS 47 48  BILITOT 0.3 0.3  PROT 6.8 6.9  ALBUMIN 3.3* 3.3*   Recent Labs  Lab 05/07/19 2348  LIPASE 21   No results for input(s): AMMONIA in the last 168 hours. Coagulation Profile: No results for input(s): INR, PROTIME in the last 168 hours. Cardiac Enzymes: No results for input(s): CKTOTAL, CKMB,  CKMBINDEX, TROPONINI in the last 168 hours. BNP (last 3 results) No results for input(s): PROBNP in the last 8760 hours. HbA1C: No results for input(s): HGBA1C in the last 72 hours. CBG: Recent Labs  Lab 05/08/19 0354  GLUCAP 75   Lipid Profile: No results for input(s): CHOL, HDL, LDLCALC, TRIG, CHOLHDL, LDLDIRECT in the last 72 hours. Thyroid Function Tests: No results for input(s): TSH, T4TOTAL, FREET4, T3FREE, THYROIDAB in the last 72 hours. Anemia Panel: No results for input(s): VITAMINB12, FOLATE, FERRITIN, TIBC, IRON, RETICCTPCT in the last 72 hours.  --------------------------------------------------------------------------------------------------------------- Urine analysis:    Component Value Date/Time   COLORURINE YELLOW 05/08/2019 0355   APPEARANCEUR CLEAR 05/08/2019 0355   LABSPEC 1.005 05/08/2019 0355   PHURINE 6.0 05/08/2019 0355   GLUCOSEU NEGATIVE 05/08/2019 0355   HGBUR SMALL (A) 05/08/2019 0355   BILIRUBINUR NEGATIVE 05/08/2019 0355   KETONESUR NEGATIVE 05/08/2019 0355   PROTEINUR NEGATIVE 05/08/2019 0355   NITRITE POSITIVE (A) 05/08/2019 0355   LEUKOCYTESUR MODERATE (A) 05/08/2019 0355      Imaging Results:    Ct Angio Chest Pe W And/or Wo Contrast  Result Date: 05/08/2019 CLINICAL DATA:  Sharp epigastric abdominal pain. Elevated D-dimer. EXAM: CT ANGIOGRAPHY CHEST CT ABDOMEN AND PELVIS WITH CONTRAST TECHNIQUE: Multidetector CT imaging of the chest was performed using the standard protocol during bolus administration  of intravenous contrast. Multiplanar CT image reconstructions and MIPs were obtained to evaluate the vascular anatomy. Multidetector CT imaging of the abdomen and pelvis was performed using the standard protocol during bolus administration of intravenous contrast. CONTRAST:  166mL OMNIPAQUE IOHEXOL 350 MG/ML SOLN COMPARISON:  None. FINDINGS: CTA CHEST FINDINGS Cardiovascular: Contrast injection is sufficient to demonstrate satisfactory  opacification of the pulmonary arteries to the segmental level. There is no pulmonary embolus. The main pulmonary artery is enlarged, measuring 3.4 cm at the bifurcation. There is no CT evidence of acute right heart strain. There is mild calcific aortic atherosclerosis. There is a normal 3-vessel arch branching pattern. Heart size is mildly enlarged. No pericardial effusion. Mediastinum/Nodes: No mediastinal, hilar or axillary lymphadenopathy. The visualized thyroid and thoracic esophageal course are unremarkable. Lungs/Pleura: 9 mm right upper lobe nodule. No pleural effusion or pneumothorax. No focal airspace consolidation. No focal pleural abnormality. Musculoskeletal: No chest wall abnormality. No acute or significant osseous findings. Review of the MIP images confirms the above findings. CT ABDOMEN and PELVIS FINDINGS Hepatobiliary: Normal hepatic contours and density. No visible biliary dilatation. Normal gallbladder. Pancreas: Normal contours without ductal dilatation. No peripancreatic fluid collection. Spleen: Normal. Adrenals/Urinary Tract: --Adrenal glands: Normal. --Right kidney/ureter: Multiple subcentimeter cysts. --Left kidney/ureter: Multiple subcentimeter cysts. --Urinary bladder: Distended urinary bladder. Stomach/Bowel: --Stomach/Duodenum: No hiatal hernia or other gastric abnormality. Normal duodenal course and caliber. --Small bowel: No dilatation or inflammation. --Colon: No focal abnormality. Right upper quadrant colonic postsurgical change. --Appendix: Not visualized. No right lower quadrant inflammation or free fluid. Vascular/Lymphatic: Atherosclerotic calcification is present within the non-aneurysmal abdominal aorta, without hemodynamically significant stenosis. No abdominal or pelvic lymphadenopathy. Reproductive: Normal uterus and ovaries. Musculoskeletal. No bony spinal canal stenosis or focal osseous abnormality. Other: None. IMPRESSION: 1. No pulmonary embolus or acute aortic  syndrome. 2. Enlarged main pulmonary artery, which may indicate underlying pulmonary hypertension. 3. No acute abdominal or pelvic abnormality. 4. 9 mm right upper lobe pulmonary nodule, unchanged as far back as 01/26/2016 and therefore benign. 5. Aortic Atherosclerosis (ICD10-I70.0). Electronically Signed   By: Ulyses Jarred M.D.   On: 05/08/2019 03:07   Ct Abdomen Pelvis W Contrast  Result Date: 05/08/2019 CLINICAL DATA:  Sharp epigastric abdominal pain. Elevated D-dimer. EXAM: CT ANGIOGRAPHY CHEST CT ABDOMEN AND PELVIS WITH CONTRAST TECHNIQUE: Multidetector CT imaging of the chest was performed using the standard protocol during bolus administration of intravenous contrast. Multiplanar CT image reconstructions and MIPs were obtained to evaluate the vascular anatomy. Multidetector CT imaging of the abdomen and pelvis was performed using the standard protocol during bolus administration of intravenous contrast. CONTRAST:  132mL OMNIPAQUE IOHEXOL 350 MG/ML SOLN COMPARISON:  None. FINDINGS: CTA CHEST FINDINGS Cardiovascular: Contrast injection is sufficient to demonstrate satisfactory opacification of the pulmonary arteries to the segmental level. There is no pulmonary embolus. The main pulmonary artery is enlarged, measuring 3.4 cm at the bifurcation. There is no CT evidence of acute right heart strain. There is mild calcific aortic atherosclerosis. There is a normal 3-vessel arch branching pattern. Heart size is mildly enlarged. No pericardial effusion. Mediastinum/Nodes: No mediastinal, hilar or axillary lymphadenopathy. The visualized thyroid and thoracic esophageal course are unremarkable. Lungs/Pleura: 9 mm right upper lobe nodule. No pleural effusion or pneumothorax. No focal airspace consolidation. No focal pleural abnormality. Musculoskeletal: No chest wall abnormality. No acute or significant osseous findings. Review of the MIP images confirms the above findings. CT ABDOMEN and PELVIS FINDINGS  Hepatobiliary: Normal hepatic contours and density. No visible biliary dilatation. Normal gallbladder.  Pancreas: Normal contours without ductal dilatation. No peripancreatic fluid collection. Spleen: Normal. Adrenals/Urinary Tract: --Adrenal glands: Normal. --Right kidney/ureter: Multiple subcentimeter cysts. --Left kidney/ureter: Multiple subcentimeter cysts. --Urinary bladder: Distended urinary bladder. Stomach/Bowel: --Stomach/Duodenum: No hiatal hernia or other gastric abnormality. Normal duodenal course and caliber. --Small bowel: No dilatation or inflammation. --Colon: No focal abnormality. Right upper quadrant colonic postsurgical change. --Appendix: Not visualized. No right lower quadrant inflammation or free fluid. Vascular/Lymphatic: Atherosclerotic calcification is present within the non-aneurysmal abdominal aorta, without hemodynamically significant stenosis. No abdominal or pelvic lymphadenopathy. Reproductive: Normal uterus and ovaries. Musculoskeletal. No bony spinal canal stenosis or focal osseous abnormality. Other: None. IMPRESSION: 1. No pulmonary embolus or acute aortic syndrome. 2. Enlarged main pulmonary artery, which may indicate underlying pulmonary hypertension. 3. No acute abdominal or pelvic abnormality. 4. 9 mm right upper lobe pulmonary nodule, unchanged as far back as 01/26/2016 and therefore benign. 5. Aortic Atherosclerosis (ICD10-I70.0). Electronically Signed   By: Ulyses Jarred M.D.   On: 05/08/2019 03:07   Dg Abdomen Acute W/chest  Result Date: 05/08/2019 CLINICAL DATA:  Abdominal pain EXAM: DG ABDOMEN ACUTE W/ 1V CHEST COMPARISON:  CT chest September 24, 2018 FINDINGS: There is a moderate to large amount of colonic stool throughout. Mildly prominent air-filled loops of splenic colon is seen. There is air to the level of the rectum. No radiopaque calculi or other significant radiographic abnormality is seen. Heart size and mediastinal contours are within normal limits. Again  noted is an 8 mm pulmonary nodule in the right upper lung. IMPRESSION: Moderate to large amount of colonic stool. However no definite evidence of obstruction. Stable 8 mm pulmonary nodule in the right upper lung. Electronically Signed   By: Prudencio Pair M.D.   On: 05/08/2019 00:41       Assessment & Plan:    Active Problems:   Diabetes mellitus (HCC)   Schizophrenia (HCC)   Iron deficiency anemia   GERD (gastroesophageal reflux disease)   Adenocarcinoma of colon (HCC)   Chest pain   Acute lower UTI   Bradycardia  Chest pain  Tele Trop I q3h x2 Check cardiac echo NPO, consider stress testing and cardiology consult in am  Bradycardia Check tsh Check depakote level, note depakote can cause bradycardia  Acute lower uti Urine culture Rocephin 1gm iv qday  Dm2 Cont Metformin ISS  Hyperlipidemia Cont Simvastatin  Schizophrenia Cont Clonazepam Cont Cogentin Cont Prolixin Cont Depakote, check level, consider decreasing dose if continues to have bradycardia  Gerd Cont Pepcid  DVT Prophylaxis-   Lovenox - SCDs   AM Labs Ordered, also please review Full Orders  Family Communication: Admission, patients condition and plan of care including tests being ordered have been discussed with the patient  who indicate understanding and agree with the plan and Code Status.  Code Status:  FULL CODE per patient, left message for niece that patient admitted to [facility]  Admission status: Observation: Based on patients clinical presentation and evaluation of above clinical data, I have made determination that patient meets observation criteria at this time.    Time spent in minutes : 70 minutes   Jani Gravel M.D on 05/08/2019 at 6:02 AM

## 2019-05-08 NOTE — Progress Notes (Signed)
*  PRELIMINARY RESULTS* Echocardiogram 2D Echocardiogram has been performed.  Leavy Cella 05/08/2019, 11:27 AM

## 2019-05-08 NOTE — Progress Notes (Signed)
Patient seen and examined. Admitted after midnight secondary to CP. Hemodynamically stable and reports being CP free currently. Troponin so far neg. Will complete medical stratification (A1C, lipid panel and echo). Patient's images demonstrated diffuse constipation and will treat it with Linzess. PPI BID ordered. Will advance diet. Hopefully home in am. Please refer to H7P written by Dr. Maudie Mercury for further info/details on admission.   Barton Dubois MD [phone]

## 2019-05-09 ENCOUNTER — Other Ambulatory Visit: Payer: Self-pay

## 2019-05-09 DIAGNOSIS — K5900 Constipation, unspecified: Secondary | ICD-10-CM

## 2019-05-09 DIAGNOSIS — K219 Gastro-esophageal reflux disease without esophagitis: Secondary | ICD-10-CM | POA: Diagnosis not present

## 2019-05-09 DIAGNOSIS — E119 Type 2 diabetes mellitus without complications: Secondary | ICD-10-CM

## 2019-05-09 DIAGNOSIS — R001 Bradycardia, unspecified: Secondary | ICD-10-CM

## 2019-05-09 DIAGNOSIS — R079 Chest pain, unspecified: Secondary | ICD-10-CM | POA: Diagnosis not present

## 2019-05-09 DIAGNOSIS — C189 Malignant neoplasm of colon, unspecified: Secondary | ICD-10-CM | POA: Diagnosis not present

## 2019-05-09 LAB — URINE CULTURE: Culture: >=100000 — AB

## 2019-05-09 LAB — GLUCOSE, CAPILLARY
Glucose-Capillary: 79 mg/dL (ref 70–99)
Glucose-Capillary: 84 mg/dL (ref 70–99)

## 2019-05-09 MED ORDER — CHLORHEXIDINE GLUCONATE CLOTH 2 % EX PADS: 6.0000 | MEDICATED_PAD | Freq: Every day | CUTANEOUS | Status: DC

## 2019-05-09 MED ORDER — FAMOTIDINE 20 MG PO TABS: 20.0000 mg | ORAL_TABLET | Freq: Every day | ORAL | Status: AC

## 2019-05-09 MED ORDER — POLYETHYLENE GLYCOL 3350 17 G PO PACK: 17.0000 g | PACK | Freq: Every day | ORAL | 0 refills | Status: AC | PRN

## 2019-05-09 MED ORDER — PANTOPRAZOLE SODIUM 40 MG PO TBEC: 40.0000 mg | DELAYED_RELEASE_TABLET | Freq: Two times a day (BID) | ORAL | 1 refills | Status: AC

## 2019-05-09 MED ORDER — DOCUSATE SODIUM 100 MG PO CAPS: 100.0000 mg | ORAL_CAPSULE | Freq: Two times a day (BID) | ORAL | 2 refills | Status: AC

## 2019-05-09 MED ORDER — FAMOTIDINE 20 MG PO TABS: 20.0000 mg | ORAL_TABLET | Freq: Every day | ORAL | Status: DC

## 2019-05-09 NOTE — Discharge Summary (Signed)
Physician Discharge Summary  Mary Wells [id number] DOB: Sep 09, 1955 DOA: 05/07/2019  PCP: Rosita Fire, MD  Admit date: 05/07/2019 Discharge date: 05/09/2019  Time spent: 35 minutes  Recommendations for Outpatient Follow-up:  1. Outpatient follow-up with cardiology service for further stratification testing recommended. 2. Repeat basic metabolic panel to follow electrolytes and renal function 3. Reassess resolution of patient reflux symptoms and if needed make referral to GI service for endoscopic evaluation.   Discharge Diagnoses:  Active Problems:   Diabetes mellitus (HCC)   Schizophrenia (HCC)   Iron deficiency anemia   GERD (gastroesophageal reflux disease)   Adenocarcinoma of colon (HCC)   Chest pain   Acute lower UTI   Bradycardia   Sinus bradycardia   Constipation   Discharge Condition: Stable and improved.  Patient discharged home with instruction to follow-up with PCP in 10 days.  Diet recommendation: Heart healthy modified carbohydrate diet.  Filed Weights   05/07/19 2324 05/08/19 1540 05/09/19 0459  Weight: 72.6 kg 71.2 kg 73.4 kg    History of present illness:  63 y.o. female, Schizophrenia, [name], Iron deficiency anemia, Adenocarcinoma of the colon, Dm2 presents with c/o chest pain left sided starting last nite. Pt states pain is near portacath site. Lasted for about 30 minutes,  And went away by itself.  It was not associated with fever, chills, cough, palp, sob, n/v.    In ED,  T 98.7, P 42, R 10, Bp 113/57  Pox 100% on RA Wt 72.6kg  CTA chest IMPRESSION: 1. No pulmonary embolus or acute aortic syndrome. 2. Enlarged main pulmonary artery, which may indicate underlying pulmonary hypertension. 3. No acute abdominal or pelvic abnormality. 4. 9 mm right upper lobe pulmonary nodule, unchanged as far back as 01/26/2016 and therefore benign. 5. Aortic Atherosclerosis (ICD10-I70.0).  Urinalysis wbc >50  Na 140, K 4.3,  Bun 14, Creatinine 0.72 Alb  3.3 Ast 19, Alt 13 Wbc 4.8, Hgb 11.6, Plt 172  Hospital Course:  1-chest pain -Appears to be associated with reflux -Heart score 4; recommending outpatient follow-up for further stratification. -Troponin negative x3 -No acute ischemic changes or abnormalities on telemetry -2D echo reassuring with normal ejection fraction and no wall motion abnormalities. -Continue statins -Patient advised to follow heart healthy diet. -PPI dose adjusted for better symptom management and at discharge no complaints of chest pain or shortness of breath reported.  2-GERD: -As mentioned above patient has been discharged on adjusted dose of PPI and the use of famotidine nightly -If further symptoms recur in the future she will benefit of GI referral for endoscopic evaluation.  3-hyperlipidemia -Continue statins.  4-bradycardia -Sinus and asymptomatic -Per record review appears to be chronic -No using any beta-blockers or calcium channel blockers currently. -Outpatient follow-up with cardiology service has been recommended -Patient denies lightheadedness, dizziness, or any other associated symptoms.  5-type 2 diabetes mellitus -Appears to be stable and well-controlled -Continue the use of metformin -Patient advised to follow modified carbohydrate diet.  6-schizophrenia/mood disorder -Stable mood currently -No agitations or hallucinations -Continue the use of clonazepam, Depakote, Prolixin and Cogentin -Continue outpatient follow-up with psychiatry service.  7-presumed UTI -Patient without fever, no nausea, no dysuria or duration of WBCs. -Culture demonstrating growth of multiple microorganisms; suggesting most likely colonization. -Patient received 2 doses of Rocephin -No further antibiotics will be provided at this time.  Procedures:  See below for x-ray reports  2D echo: 1. Left ventricular ejection fraction, by visual estimation, is 60 to 65%. The left ventricle has normal  function.  There is no left ventricular hypertrophy.  2. Left ventricular diastolic Doppler parameters are consistent with pseudonormalization pattern of LV diastolic filling.  3. Global right ventricle has normal systolic function.The right ventricular size is normal. No increase in right ventricular wall thickness.  4. Left atrial size was normal.  5. Right atrial size was normal.  6. The mitral valve is normal in structure. No evidence of mitral valve regurgitation.  7. The tricuspid valve is normal in structure. Tricuspid valve regurgitation is trivial.  8. The aortic valve is normal in structure. Aortic valve regurgitation was not visualized by color flow Doppler. Structurally normal aortic valve, with no evidence of sclerosis or stenosis.  9. The pulmonic valve was grossly normal. Pulmonic valve regurgitation is not visualized by color flow Doppler. 10. The inferior vena cava is normal in size with greater than 50% respiratory variability, suggesting right atrial pressure of 3 mmHg. 11. The atrial septum is grossly normal.  Consultations:  Cardiology curbside (Dr. Radford Pax); recommendations for outpatient follow up for further stratification.   Discharge Exam: Vitals:   05/08/19 2111 05/09/19 0459  BP: (!) 134/59 (!) 102/50  Pulse: (!) 45 (!) 36  Resp: 20 18  Temp: 97.6 F (36.4 C) 97.7 F (36.5 C)  SpO2: 100% 99%    General: Afebrile, no chest pain, no nausea, no vomiting, no dysuria and no shortness of breath.  Patient in no acute distress and ready to go home. Cardiovascular: S1-S2, no rubs, no gallops, no JVD on exam.  Soft systolic ejection murmur appreciated on exam. Respiratory: Good air movement bilaterally, no wheezing, no crackles, normal respiratory effort. Abdomen: Soft, nontender, nondistended, positive bowel sounds Extremities: No edema, no cyanosis, no clubbing.  Discharge Instructions   Discharge Instructions    Diet - low sodium heart healthy   Complete by: As  directed    Discharge instructions   Complete by: As directed    Take medications as prescribed Follow heart healthy diet Arrange follow-up with PCP in 10 days Follow-up with cardiology service as an outpatient (office will contact you with appointment details)     Allergies as of 05/09/2019   No Known Allergies     Medication List    TAKE these medications   benztropine 1 MG tablet Commonly known as: COGENTIN Take 1 mg by mouth 2 (two) times daily.   clonazePAM 0.5 MG tablet Commonly known as: KLONOPIN Take 0.5 mg by mouth 2 (two) times daily.   Depakote ER 250 MG 24 hr tablet Generic drug: divalproex Take 250 mg by mouth at bedtime.   docusate sodium 100 MG capsule Commonly known as: COLACE Take 100 mg by mouth daily as needed for mild constipation. What changed: Another medication with the same name was added. Make sure you understand how and when to take each.   docusate sodium 100 MG capsule Commonly known as: Colace Take 1 capsule (100 mg total) by mouth 2 (two) times daily. What changed: You were already taking a medication with the same name, and this prescription was added. Make sure you understand how and when to take each.   famotidine 20 MG tablet Commonly known as: PEPCID Take 1 tablet (20 mg total) by mouth at bedtime. What changed: when to take this   fluPHENAZine 5 MG tablet Commonly known as: PROLIXIN two by mouth two times per day   Iron High-Potency 325 MG Tabs Take 325 mg by mouth 2 (two) times daily with a meal.   loratadine  10 MG tablet Commonly known as: CLARITIN Take 10 mg by mouth daily.   metFORMIN 500 MG tablet Commonly known as: GLUCOPHAGE Take 500 mg by mouth 2 (two) times daily with a meal.   pantoprazole 40 MG tablet Commonly known as: PROTONIX Take 1 tablet (40 mg total) by mouth 2 (two) times daily.   polyethylene glycol 17 g packet Commonly known as: MiraLax Take 17 g by mouth daily as needed for mild constipation.    PSYLLIUM FIBER PO Take 1 capsule by mouth 2 (two) times daily.   simvastatin 20 MG tablet Commonly known as: ZOCOR Take 20 mg by mouth daily.      No Known Allergies Follow-up Information    Rosita Fire, MD. Schedule an appointment as soon as possible for a visit in 10 day(s).   Specialty: Internal Medicine Contact information: [place] [address] [phone]        Herminio Commons, MD. Call today.   Specialty: Cardiology Why: office to set up appointment in 2 weeks Contact information: [place] [address] [phone]           The results of significant diagnostics from this hospitalization (including imaging, microbiology, ancillary and laboratory) are listed below for reference.    Significant Diagnostic Studies: Ct Angio Chest Pe W And/or Wo Contrast  Result Date: 05/08/2019 CLINICAL DATA:  Sharp epigastric abdominal pain. Elevated D-dimer. EXAM: CT ANGIOGRAPHY CHEST CT ABDOMEN AND PELVIS WITH CONTRAST TECHNIQUE: Multidetector CT imaging of the chest was performed using the standard protocol during bolus administration of intravenous contrast. Multiplanar CT image reconstructions and MIPs were obtained to evaluate the vascular anatomy. Multidetector CT imaging of the abdomen and pelvis was performed using the standard protocol during bolus administration of intravenous contrast. CONTRAST:  170mL OMNIPAQUE IOHEXOL 350 MG/ML SOLN COMPARISON:  None. FINDINGS: CTA CHEST FINDINGS Cardiovascular: Contrast injection is sufficient to demonstrate satisfactory opacification of the pulmonary arteries to the segmental level. There is no pulmonary embolus. The main pulmonary artery is enlarged, measuring 3.4 cm at the bifurcation. There is no CT evidence of acute right heart strain. There is mild calcific aortic atherosclerosis. There is a normal 3-vessel arch branching pattern. Heart size is mildly enlarged. No pericardial effusion.  Mediastinum/Nodes: No mediastinal, hilar or axillary lymphadenopathy. The visualized thyroid and thoracic esophageal course are unremarkable. Lungs/Pleura: 9 mm right upper lobe nodule. No pleural effusion or pneumothorax. No focal airspace consolidation. No focal pleural abnormality. Musculoskeletal: No chest wall abnormality. No acute or significant osseous findings. Review of the MIP images confirms the above findings. CT ABDOMEN and PELVIS FINDINGS Hepatobiliary: Normal hepatic contours and density. No visible biliary dilatation. Normal gallbladder. Pancreas: Normal contours without ductal dilatation. No peripancreatic fluid collection. Spleen: Normal. Adrenals/Urinary Tract: --Adrenal glands: Normal. --Right kidney/ureter: Multiple subcentimeter cysts. --Left kidney/ureter: Multiple subcentimeter cysts. --Urinary bladder: Distended urinary bladder. Stomach/Bowel: --Stomach/Duodenum: No hiatal hernia or other gastric abnormality. Normal duodenal course and caliber. --Small bowel: No dilatation or inflammation. --Colon: No focal abnormality. Right upper quadrant colonic postsurgical change. --Appendix: Not visualized. No right lower quadrant inflammation or free fluid. Vascular/Lymphatic: Atherosclerotic calcification is present within the non-aneurysmal abdominal aorta, without hemodynamically significant stenosis. No abdominal or pelvic lymphadenopathy. Reproductive: Normal uterus and ovaries. Musculoskeletal. No bony spinal canal stenosis or focal osseous abnormality. Other: None. IMPRESSION: 1. No pulmonary embolus or acute aortic syndrome. 2. Enlarged main pulmonary artery, which may indicate underlying pulmonary hypertension. 3. No acute abdominal or pelvic abnormality. 4.  9 mm right upper lobe pulmonary nodule, unchanged as far back as 01/26/2016 and therefore benign. 5. Aortic Atherosclerosis (ICD10-I70.0). Electronically Signed   By: Ulyses Jarred M.D.   On: 05/08/2019 03:07   Ct Abdomen Pelvis W  Contrast  Result Date: 05/08/2019 CLINICAL DATA:  Sharp epigastric abdominal pain. Elevated D-dimer. EXAM: CT ANGIOGRAPHY CHEST CT ABDOMEN AND PELVIS WITH CONTRAST TECHNIQUE: Multidetector CT imaging of the chest was performed using the standard protocol during bolus administration of intravenous contrast. Multiplanar CT image reconstructions and MIPs were obtained to evaluate the vascular anatomy. Multidetector CT imaging of the abdomen and pelvis was performed using the standard protocol during bolus administration of intravenous contrast. CONTRAST:  159mL OMNIPAQUE IOHEXOL 350 MG/ML SOLN COMPARISON:  None. FINDINGS: CTA CHEST FINDINGS Cardiovascular: Contrast injection is sufficient to demonstrate satisfactory opacification of the pulmonary arteries to the segmental level. There is no pulmonary embolus. The main pulmonary artery is enlarged, measuring 3.4 cm at the bifurcation. There is no CT evidence of acute right heart strain. There is mild calcific aortic atherosclerosis. There is a normal 3-vessel arch branching pattern. Heart size is mildly enlarged. No pericardial effusion. Mediastinum/Nodes: No mediastinal, hilar or axillary lymphadenopathy. The visualized thyroid and thoracic esophageal course are unremarkable. Lungs/Pleura: 9 mm right upper lobe nodule. No pleural effusion or pneumothorax. No focal airspace consolidation. No focal pleural abnormality. Musculoskeletal: No chest wall abnormality. No acute or significant osseous findings. Review of the MIP images confirms the above findings. CT ABDOMEN and PELVIS FINDINGS Hepatobiliary: Normal hepatic contours and density. No visible biliary dilatation. Normal gallbladder. Pancreas: Normal contours without ductal dilatation. No peripancreatic fluid collection. Spleen: Normal. Adrenals/Urinary Tract: --Adrenal glands: Normal. --Right kidney/ureter: Multiple subcentimeter cysts. --Left kidney/ureter: Multiple subcentimeter cysts. --Urinary bladder:  Distended urinary bladder. Stomach/Bowel: --Stomach/Duodenum: No hiatal hernia or other gastric abnormality. Normal duodenal course and caliber. --Small bowel: No dilatation or inflammation. --Colon: No focal abnormality. Right upper quadrant colonic postsurgical change. --Appendix: Not visualized. No right lower quadrant inflammation or free fluid. Vascular/Lymphatic: Atherosclerotic calcification is present within the non-aneurysmal abdominal aorta, without hemodynamically significant stenosis. No abdominal or pelvic lymphadenopathy. Reproductive: Normal uterus and ovaries. Musculoskeletal. No bony spinal canal stenosis or focal osseous abnormality. Other: None. IMPRESSION: 1. No pulmonary embolus or acute aortic syndrome. 2. Enlarged main pulmonary artery, which may indicate underlying pulmonary hypertension. 3. No acute abdominal or pelvic abnormality. 4. 9 mm right upper lobe pulmonary nodule, unchanged as far back as 01/26/2016 and therefore benign. 5. Aortic Atherosclerosis (ICD10-I70.0). Electronically Signed   By: Ulyses Jarred M.D.   On: 05/08/2019 03:07   Dg Abdomen Acute W/chest  Result Date: 05/08/2019 CLINICAL DATA:  Abdominal pain EXAM: DG ABDOMEN ACUTE W/ 1V CHEST COMPARISON:  CT chest September 24, 2018 FINDINGS: There is a moderate to large amount of colonic stool throughout. Mildly prominent air-filled loops of splenic colon is seen. There is air to the level of the rectum. No radiopaque calculi or other significant radiographic abnormality is seen. Heart size and mediastinal contours are within normal limits. Again noted is an 8 mm pulmonary nodule in the right upper lung. IMPRESSION: Moderate to large amount of colonic stool. However no definite evidence of obstruction. Stable 8 mm pulmonary nodule in the right upper lung. Electronically Signed   By: Prudencio Pair M.D.   On: 05/08/2019 00:41    Microbiology: Recent Results (from the past 240 hour(s))  SARS CORONAVIRUS 2 (TAT 6-24 HRS)  Nasopharyngeal Urine, Clean Catch     Status:  None   Collection Time: 05/08/19  3:55 AM   Specimen: Urine, Clean Catch; Nasopharyngeal  Result Value Ref Range Status   SARS Coronavirus 2 NEGATIVE NEGATIVE Final    Comment: (NOTE) SARS-CoV-2 target nucleic acids are NOT DETECTED. The SARS-CoV-2 RNA is generally detectable in upper and lower respiratory specimens during the acute phase of infection. Negative results do not preclude SARS-CoV-2 infection, do not rule out co-infections with other pathogens, and should not be used as the sole basis for treatment or other patient management decisions. Negative results must be combined with clinical observations, patient history, and epidemiological information. The expected result is Negative. Fact Sheet for Patients: SugarRoll.be Fact Sheet for Healthcare Providers: https://www.woods-mathews.com/ This test is not yet approved or cleared by the [place] FDA and  has been authorized for detection and/or diagnosis of SARS-CoV-2 by FDA under an Emergency Use Authorization (EUA). This EUA will remain  in effect (meaning this test can be used) for the duration of the COVID-19 declaration under Section 56 4(b)(1) of the Act, 21 U.S.C. section 360bbb-3(b)(1), unless the authorization is terminated or revoked sooner. Performed at [facility], [place] [address], [place], [place] [address]   Urine Culture     Status: Abnormal   Collection Time: 05/08/19  4:57 AM   Specimen: Urine, Clean Catch  Result Value Ref Range Status   Specimen Description   Final    URINE, CLEAN CATCH Performed at [facility], [address], [place], [place] [address]    Special Requests   Final    NONE Performed at [facility], [address], [place], [place] [address]    Culture (A)  Final    >=100,000 COLONIES/mL MULTIPLE SPECIES PRESENT, SUGGEST RECOLLECTION   Report Status 05/09/2019 FINAL  Final      Labs: Basic Metabolic Panel: Recent Labs  Lab 05/07/19 2348 05/08/19 0435  NA 139 140  K 4.0 4.3  CL 105 108  CO2 26 27  GLUCOSE 93 111*  BUN 16 14  CREATININE 0.79 0.72  CALCIUM 9.0 9.0   Liver Function Tests: Recent Labs  Lab 05/07/19 2348 05/08/19 0435  AST 18 19  ALT 13 13  ALKPHOS 47 48  BILITOT 0.3 0.3  PROT 6.8 6.9  ALBUMIN 3.3* 3.3*   Recent Labs  Lab 05/07/19 2348  LIPASE 21   CBC: Recent Labs  Lab 05/07/19 2348 05/08/19 0435  WBC 5.0 4.8  NEUTROABS 2.2  --   HGB 11.3* 11.6*  HCT 37.0 38.3  MCV 84.5 84.0  PLT 176 172   CBG: Recent Labs  Lab 05/08/19 0846 05/08/19 1330 05/08/19 1635 05/08/19 2108 05/09/19 0734  GLUCAP 78 100* 154* 123* 79   Signed:  Barton Dubois MD.  [facility] 05/09/2019, 11:03 AM

## 2019-06-15 ENCOUNTER — Other Ambulatory Visit: Payer: Self-pay

## 2019-06-15 ENCOUNTER — Other Ambulatory Visit (HOSPITAL_COMMUNITY): Payer: Self-pay | Admitting: Internal Medicine

## 2019-06-15 ENCOUNTER — Ambulatory Visit (HOSPITAL_COMMUNITY)
Admission: RE | Admit: 2019-06-15 | Discharge: 2019-06-15 | Disposition: A | Discharge status: Home / Self Care | Payer: Medicare Other | Source: Ambulatory Visit | Attending: Internal Medicine | Admitting: Internal Medicine

## 2019-06-15 DIAGNOSIS — R109 Unspecified abdominal pain: Secondary | ICD-10-CM | POA: Insufficient documentation

## 2019-09-12 ENCOUNTER — Ambulatory Visit: Payer: Medicare Other

## 2019-09-12 ENCOUNTER — Other Ambulatory Visit: Payer: Self-pay

## 2019-09-12 DIAGNOSIS — Z23 Encounter for immunization: Secondary | ICD-10-CM | POA: Insufficient documentation

## 2019-09-12 NOTE — Progress Notes (Signed)
   Covid-19 Vaccination Clinic  Name:  Mary Wells    MRN: [id number] DOB: 1956-06-07  09/12/2019  [name] was observed post Covid-19 immunization for 15 minutes without incidence. She was provided with Vaccine Information Sheet and instruction to access the V-Safe system.   [name] was instructed to call 911 with any severe reactions post vaccine: Marland Kitchen Difficulty breathing  . Swelling of your face and throat  . A fast heartbeat  . A bad rash all over your body  . Dizziness and weakness    Immunizations Administered    Name Date Dose VIS Date Route   Moderna COVID-19 Vaccine 09/12/2019  2:57 PM 0.5 mL 07/06/2019 Intramuscular   Manufacturer: Moderna   Lot: [id number]   [id number]

## 2019-09-13 ENCOUNTER — Ambulatory Visit: Payer: Medicare Other

## 2019-09-21 ENCOUNTER — Inpatient Hospital Stay (HOSPITAL_COMMUNITY): Payer: Medicare Other | Attending: Nurse Practitioner

## 2019-09-21 ENCOUNTER — Ambulatory Visit (HOSPITAL_COMMUNITY)
Admission: RE | Admit: 2019-09-21 | Discharge: 2019-09-21 | Disposition: A | Discharge status: Home / Self Care | Payer: Medicare Other | Source: Ambulatory Visit | Attending: Nurse Practitioner | Admitting: Nurse Practitioner

## 2019-09-21 ENCOUNTER — Other Ambulatory Visit: Payer: Self-pay

## 2019-09-21 DIAGNOSIS — Z79899 Other long term (current) drug therapy: Secondary | ICD-10-CM | POA: Diagnosis not present

## 2019-09-21 DIAGNOSIS — R921 Mammographic calcification found on diagnostic imaging of breast: Secondary | ICD-10-CM | POA: Insufficient documentation

## 2019-09-21 DIAGNOSIS — C189 Malignant neoplasm of colon, unspecified: Secondary | ICD-10-CM

## 2019-09-21 DIAGNOSIS — Z85038 Personal history of other malignant neoplasm of large intestine: Secondary | ICD-10-CM | POA: Insufficient documentation

## 2019-09-21 DIAGNOSIS — R911 Solitary pulmonary nodule: Secondary | ICD-10-CM | POA: Diagnosis not present

## 2019-09-21 LAB — CBC WITH DIFFERENTIAL/PLATELET
Abs Immature Granulocytes: 0.09 10*3/uL — ABNORMAL HIGH (ref 0.00–0.07)
Basophils Absolute: 0.1 10*3/uL (ref 0.0–0.1)
Basophils Relative: 1 %
Eosinophils Absolute: 0.1 10*3/uL (ref 0.0–0.5)
Eosinophils Relative: 1 %
HCT: 40 % (ref 36.0–46.0)
Hemoglobin: 12 g/dL (ref 12.0–15.0)
Immature Granulocytes: 1 %
Lymphocytes Relative: 25 %
Lymphs Abs: 1.6 10*3/uL (ref 0.7–4.0)
MCH: 25.4 pg — ABNORMAL LOW (ref 26.0–34.0)
MCHC: 30 g/dL (ref 30.0–36.0)
MCV: 84.7 fL (ref 80.0–100.0)
Monocytes Absolute: 0.5 10*3/uL (ref 0.1–1.0)
Monocytes Relative: 8 %
Neutro Abs: 4.1 10*3/uL (ref 1.7–7.7)
Neutrophils Relative %: 64 %
Platelets: 266 10*3/uL (ref 150–400)
RBC: 4.72 MIL/uL (ref 3.87–5.11)
RDW: 15.3 % (ref 11.5–15.5)
WBC: 6.5 10*3/uL (ref 4.0–10.5)
nRBC: 0 % (ref 0.0–0.2)

## 2019-09-21 LAB — VITAMIN D 25 HYDROXY (VIT D DEFICIENCY, FRACTURES): Vit D, 25-Hydroxy: 9.66 ng/mL — ABNORMAL LOW (ref 30–100)

## 2019-09-21 LAB — LACTATE DEHYDROGENASE: LDH: 122 U/L (ref 98–192)

## 2019-09-21 LAB — COMPREHENSIVE METABOLIC PANEL
ALT: 10 U/L (ref 0–44)
AST: 15 U/L (ref 15–41)
Albumin: 3.7 g/dL (ref 3.5–5.0)
Alkaline Phosphatase: 59 U/L (ref 38–126)
Anion gap: 11 (ref 5–15)
BUN: 15 mg/dL (ref 8–23)
CO2: 29 mmol/L (ref 22–32)
Calcium: 9.8 mg/dL (ref 8.9–10.3)
Chloride: 101 mmol/L (ref 98–111)
Creatinine, Ser: 0.88 mg/dL (ref 0.44–1.00)
GFR calc Af Amer: >60 mL/min (ref >60)
GFR calc non Af Amer: >60 mL/min (ref >60)
Glucose, Bld: 110 mg/dL — ABNORMAL HIGH (ref 70–99)
Potassium: 4.3 mmol/L (ref 3.5–5.1)
Sodium: 141 mmol/L (ref 135–145)
Total Bilirubin: 0.3 mg/dL (ref 0.3–1.2)
Total Protein: 8 g/dL (ref 6.5–8.1)

## 2019-09-21 LAB — VITAMIN B12: Vitamin B-12: 336 pg/mL (ref 180–914)

## 2019-09-21 MED ORDER — IOHEXOL 300 MG/ML  SOLN
100.0000 mL | Freq: Once | INTRAMUSCULAR | Status: AC | PRN
  Administered 2019-09-21: 16:00:00 100 mL via INTRAVENOUS

## 2019-09-22 LAB — CEA: CEA: 9 ng/mL — ABNORMAL HIGH (ref 0.0–4.7)

## 2019-09-27 ENCOUNTER — Other Ambulatory Visit (HOSPITAL_COMMUNITY): Payer: Medicaid Other

## 2019-09-27 ENCOUNTER — Ambulatory Visit (HOSPITAL_COMMUNITY)
Admission: RE | Admit: 2019-09-27 | Discharge: 2019-09-27 | Disposition: A | Discharge status: Home / Self Care | Payer: Medicare Other | Source: Ambulatory Visit | Attending: Nurse Practitioner | Admitting: Nurse Practitioner

## 2019-09-27 ENCOUNTER — Other Ambulatory Visit: Payer: Self-pay

## 2019-09-27 ENCOUNTER — Ambulatory Visit (HOSPITAL_COMMUNITY): Payer: Medicare Other

## 2019-09-27 DIAGNOSIS — Z1231 Encounter for screening mammogram for malignant neoplasm of breast: Secondary | ICD-10-CM | POA: Insufficient documentation

## 2019-09-28 ENCOUNTER — Ambulatory Visit (HOSPITAL_COMMUNITY): Payer: Medicaid Other | Admitting: Nurse Practitioner

## 2019-09-30 ENCOUNTER — Inpatient Hospital Stay (HOSPITAL_BASED_OUTPATIENT_CLINIC_OR_DEPARTMENT_OTHER): Payer: Medicare Other | Admitting: Nurse Practitioner

## 2019-09-30 ENCOUNTER — Other Ambulatory Visit: Payer: Self-pay

## 2019-09-30 VITALS — BP 105/52 | HR 57 | Temp 97.1°F | Resp 16 | Wt 157.0 lb

## 2019-09-30 DIAGNOSIS — Z85038 Personal history of other malignant neoplasm of large intestine: Secondary | ICD-10-CM | POA: Diagnosis not present

## 2019-09-30 DIAGNOSIS — E559 Vitamin D deficiency, unspecified: Secondary | ICD-10-CM

## 2019-09-30 DIAGNOSIS — C189 Malignant neoplasm of colon, unspecified: Secondary | ICD-10-CM | POA: Diagnosis not present

## 2019-09-30 MED ORDER — ERGOCALCIFEROL 1.25 MG (50000 UT) PO CAPS: 50000.0000 [IU] | ORAL_CAPSULE | ORAL | 4 refills | Status: DC

## 2019-09-30 NOTE — Patient Instructions (Signed)
[place] at [facility] Discharge Instructions  Follow up in 6 months with labs and scans   Thank you for choosing [place] at [facility] to provide your oncology and hematology care.  To afford each patient quality time with our provider, please arrive at least 15 minutes before your scheduled appointment time.   If you have a lab appointment with the [facility] please come in thru the Main Entrance and check in at the main information desk.  You need to re-schedule your appointment should you arrive 10 or more minutes late.  We strive to give you quality time with our providers, and arriving late affects you and other patients whose appointments are after yours.  Also, if you no show three or more times for appointments you may be dismissed from the clinic at the providers discretion.     Again, thank you for choosing [facility].  Our hope is that these requests will decrease the amount of time that you wait before being seen by our physicians.       _____________________________________________________________  Should you have questions after your visit to [facility], please contact our office at [phone] between the hours of 8:00 a.m. and 4:30 p.m.  Voicemails left after 4:00 p.m. will not be returned until the following business day.  For prescription refill requests, have your pharmacy contact our office and allow 72 hours.    Due to Covid, you will need to wear a mask upon entering the hospital. If you do not have a mask, a mask will be given to you at the Main Entrance upon arrival. For doctor visits, patients may have 1 support person with them. For treatment visits, patients can not have anyone with them due to social distancing guidelines and our immunocompromised population.

## 2019-09-30 NOTE — Progress Notes (Signed)
[address] [place], [place] [address]   CLINIC:  Medical Oncology/Hematology  PCP:  Rosita Fire, MD [place] [address] [phone]   REASON FOR VISIT: Follow-up for colon cancer  CURRENT THERAPY: Observation  BRIEF ONCOLOGIC HISTORY:  Oncology History  Adenocarcinoma of colon ([place])  01/25/2016 Imaging   CT C/A/P large 9 cm mass within the colon at level of hepatic flexure, enlarged mesenteric LN concerning for metastatic disease, There is a nonspecific 13 mm nodule within the right hemipelvis which may represent a metastatic deposit. Alternatively, given the appearance, this may represent a small amount of loculated fluid. There are nonspecific low-attenuation lesions within the liver, the majority which are subcentimeter in size. Metastatic disease is not excluded.    01/25/2016 Procedure   Colonoscopy on 6/22 with Large fungating, exophytic tumor appears to be arising out of the base of cecum / appendix. It's partially obscuring the ileocecal valve.   01/29/2016 Surgery   R hemicolectomy with resection of terminal ileum, liver biopsy with Dr. Arnoldo Morale   01/29/2016 Pathology Results   INVASIVE WELL DIFFERENTIATED ADENOCARCINOMA WITH ABUNDANT EXTRACELLULAR MUCIN, SPANNING 19 CM IN GREATEST DIMENSION. - TUMOR INVADES THROUGH MUSCULARIS PROPRIA THROUGH THE SEROSA OF ONE BOWEL SEGMENT INTO A SECOND ADHESED SEGMENT OF BOWEL. - MARGINS ARE NEGATIVE.No extramural satellite tumor nodules seen, 0/27 LN, Liver biopsy with benign liver tissue   03/15/2016 PET scan   No abnormal metabolic activity status post interval right hemicolectomy. No evidence of residual or metastatic disease. The right upper lobe pulmonary nodule is stable and without hypermetabolic activity. This favors a benign etiology.      CANCER STAGING: Cancer Staging Adenocarcinoma of colon [facility]) Staging form: Colon and Rectum, AJCC 7th Edition - Pathologic  stage from 02/02/2016: Stage IIC (T4b, N0, cM0) - Signed by Baird Cancer, PA-C on 07/04/2016    INTERVAL HISTORY:  Mary Wells 64 y.o. female returns for routine follow-up for colon cancer.  Patient reports she has been doing well since her last visit.  She denies any bright red bleeding per rectum or melena.  She denies any change in bowel habits.  She denies any new abdominal pain. Denies any nausea, vomiting, or diarrhea. Denies any new pains. Had not noticed any recent bleeding such as epistaxis, hematuria or hematochezia. Denies recent chest pain on exertion, shortness of breath on minimal exertion, pre-syncopal episodes, or palpitations. Denies any numbness or tingling in hands or feet. Denies any recent fevers, infections, or recent hospitalizations. Patient reports appetite at 100% and energy level at 75%.  Patient is eating well maintain her weight at this time.    REVIEW OF SYSTEMS:  Review of Systems  All other systems reviewed and are negative.    PAST MEDICAL/SURGICAL HISTORY:  Past Medical History:  Diagnosis Date  . Adenocarcinoma of colon ([place])   . Allergy   . Diabetes mellitus without complication ([place]) 123456  . GERD (gastroesophageal reflux disease)   . Iron deficiency anemia 09/18/2013  . Schizophrenia [facility])    Past Surgical History:  Procedure Laterality Date  . BIOPSY  01/24/2016   Procedure: BIOPSY;  Surgeon: Daneil Dolin, MD;  Location: [facility] END[facility] SU[facility];  Service: Endoscopy;;  cecal mass  . COLONOSCOPY  04/21/2006   SLF: limited colonoscopy due to a poor bowel prep   . COLONOSCOPY  02/22/2009   SLF: poor bowel prep/Formed stools in the right colon which limited the extent of the exam/The scope was  passed to approximately the proximal transverse colon.  No polyps, masses, inflammatory changes, diverticular/normal rectum  . COLONOSCOPY WITH PROPOFOL N/A 01/24/2016   Dr. Gala Romney: large fungating, exophytic tumor arising out of the base of the cecum/appendix. Partially  obscuring the ileocecal valve  . COLONOSCOPY WITH PROPOFOL N/A 10/01/2018   Procedure: COLONOSCOPY WITH PROPOFOL;  Surgeon: Danie Binder, MD;  Location: [facility] END[facility];  Service: Endoscopy;  Laterality: N/A;  . EXCISIONAL HEMORRHOIDECTOMY    . LIVER BIOPSY N/A 01/29/2016   Procedure: LIVER BIOPSY;  Surgeon: Aviva Signs, MD;  Location: [facility] ORS;  Service: General;  Laterality: N/A;  . PARTIAL COLECTOMY N/A 01/29/2016   Procedure: PARTIAL COLECTOMY ;  Surgeon: Aviva Signs, MD;  Location: [facility] ORS;  Service: General;  Laterality: N/A;  . PORTACATH PLACEMENT Left 01/29/2016   Procedure: INSERTION PORT-A-CATH;  Surgeon: Aviva Signs, MD;  Location: [facility] ORS;  Service: General;  Laterality: Left;  left subclavian     SOCIAL HISTORY:  Social History   Socioeconomic History  . Marital status: Single    Spouse name: Lynnae Prude  . Number of children: 1  . Years of education: 33  . Highest education level: Not on file  Occupational History  . Not on file  Tobacco Use  . Smoking status: Current Every Day Smoker    Packs/day: 0.50    Years: 19.00    Pack years: 9.50    Types: Cigarettes  . Smokeless tobacco: Never Used  . Tobacco comment: 3 cigs daily  Substance and Sexual Activity  . Alcohol use: No    Alcohol/week: 0.0 standard drinks  . Drug use: No  . Sexual activity: Never  Other Topics Concern  . Not on file  Social History Narrative  . Not on file   Social Determinants of Health   Financial Resource Strain: Low Risk   . Difficulty of Paying Living Expenses: Not very hard  Food Insecurity: Food Insecurity Present  . Worried About Charity fundraiser in the Last Year: Sometimes true  . Ran Out of Food in the Last Year: Sometimes true  Transportation Needs: Unknown  . Lack of Transportation (Medical): Patient refused  . Lack of Transportation (Non-Medical): Patient refused  Physical Activity: Unknown  . Days of Exercise per Week: Patient refused  . Minutes of Exercise per  Session: Patient refused  Stress: No Stress Concern Present  . Feeling of Stress : Only a little  Social Connections: Unknown  . Frequency of Communication with Friends and Family: Patient refused  . Frequency of Social Gatherings with Friends and Family: Patient refused  . Attends Religious Services: Patient refused  . Active Member of Clubs or Organizations: Patient refused  . Attends Archivist Meetings: Patient refused  . Marital Status: Patient refused  Intimate Partner Violence: Unknown  . Fear of Current or Ex-Partner: Patient refused  . Emotionally Abused: Patient refused  . Physically Abused: Patient refused  . Sexually Abused: Patient refused    FAMILY HISTORY:  Family History  Problem Relation Age of Onset  . Leukemia Paternal Grandfather   . Leukemia Cousin   . Colon cancer Neg Hx     CURRENT MEDICATIONS:  Outpatient Encounter Medications as of 09/30/2019  Medication Sig  . benztropine (COGENTIN) 1 MG tablet Take 1 mg by mouth 2 (two) times daily.  . clonazePAM (KLONOPIN) 0.5 MG tablet Take 0.5 mg by mouth 2 (two) times daily.   . divalproex (DEPAKOTE ER) 250 MG 24 hr tablet Take  250 mg by mouth at bedtime.  . docusate sodium (COLACE) 100 MG capsule Take 1 capsule (100 mg total) by mouth 2 (two) times daily.  . famotidine (PEPCID) 20 MG tablet Take 1 tablet (20 mg total) by mouth at bedtime.  . Ferrous Sulfate (IRON HIGH-POTENCY) 325 MG TABS Take 325 mg by mouth 2 (two) times daily with a meal.  . fluPHENAZine (PROLIXIN) 5 MG tablet two by mouth two times per day  . loratadine (CLARITIN) 10 MG tablet Take 10 mg by mouth daily.  . metFORMIN (GLUCOPHAGE) 500 MG tablet Take 500 mg by mouth 2 (two) times daily with a meal.   . pantoprazole (PROTONIX) 40 MG tablet Take 1 tablet (40 mg total) by mouth 2 (two) times daily.  . PSYLLIUM FIBER PO Take 1 capsule by mouth 2 (two) times daily.   . simvastatin (ZOCOR) 20 MG tablet Take 20 mg by mouth daily.  .  ergocalciferol (VITAMIN D2) 1.25 MG (50000 UT) capsule Take 1 capsule (50,000 Units total) by mouth once a week.  . polyethylene glycol (MIRALAX) 17 g packet Take 17 g by mouth daily as needed for mild constipation. (Patient not taking: Reported on 09/30/2019)  . [DISCONTINUED] docusate sodium (COLACE) 100 MG capsule Take 100 mg by mouth daily as needed for mild constipation.   No facility-administered encounter medications on file as of 09/30/2019.    ALLERGIES:  No Known Allergies   PHYSICAL EXAM:  ECOG Performance status: 2  Vitals:   09/30/19 1145  BP: (!) 105/52  Pulse: (!) 57  Resp: 16  Temp: (!) 97.1 F (36.2 C)  SpO2: 99%   Filed Weights   09/30/19 1145  Weight: 157 lb (71.2 kg)    Physical Exam Constitutional:      Appearance: Normal appearance. She is normal weight.  Cardiovascular:     Rate and Rhythm: Normal rate and regular rhythm.     Heart sounds: Normal heart sounds.  Pulmonary:     Effort: Pulmonary effort is normal.     Breath sounds: Normal breath sounds.  Abdominal:     General: Bowel sounds are normal.     Palpations: Abdomen is soft.  Musculoskeletal:        General: Normal range of motion.  Skin:    General: Skin is warm.  Neurological:     Mental Status: She is alert and oriented to person, place, and time. Mental status is at baseline.  Psychiatric:        Mood and Affect: Mood normal.        Behavior: Behavior normal.        Thought Content: Thought content normal.        Judgment: Judgment normal.      LABORATORY DATA:  I have reviewed the labs as listed.  CBC    Component Value Date/Time   WBC 6.5 09/21/2019 1515   RBC 4.72 09/21/2019 1515   HGB 12.0 09/21/2019 1515   HCT 40.0 09/21/2019 1515   PLT 266 09/21/2019 1515   MCV 84.7 09/21/2019 1515   MCH 25.4 (L) 09/21/2019 1515   MCHC 30.0 09/21/2019 1515   RDW 15.3 09/21/2019 1515   LYMPHSABS 1.6 09/21/2019 1515   MONOABS 0.5 09/21/2019 1515   EOSABS 0.1 09/21/2019 1515    BASOSABS 0.1 09/21/2019 1515   CMP Latest Ref Rng & Units 09/21/2019 05/08/2019 05/07/2019  Glucose 70 - 99 mg/dL 110(H) 111(H) 93  BUN 8 - 23 mg/dL 15 14 16   Creatinine  0.44 - 1.00 mg/dL 0.88 0.72 0.79  Sodium 135 - 145 mmol/L 141 140 139  Potassium 3.5 - 5.1 mmol/L 4.3 4.3 4.0  Chloride 98 - 111 mmol/L 101 108 105  CO2 22 - 32 mmol/L 29 27 26   Calcium 8.9 - 10.3 mg/dL 9.8 9.0 9.0  Total Protein 6.5 - 8.1 g/dL 8.0 6.9 6.8  Total Bilirubin 0.3 - 1.2 mg/dL 0.3 0.3 0.3  Alkaline Phos 38 - 126 U/L 59 48 47  AST 15 - 41 U/L 15 19 18   ALT 0 - 44 U/L 10 13 13     DIAGNOSTIC IMAGING:  I have independently reviewed the CT scans and mammogram and discussed with the patient.  I personally performed a face-to-face visit.  All questions were answered to patient's stated satisfaction. Encouraged patient to call with any new concerns or questions before his next visit to the cancer center and we can certain see him sooner, if needed.      ASSESSMENT & PLAN:   Adenocarcinoma of colon ([place]) 1.  Stage II adenocarcinoma of the colon: - Patient was diagnosed 01/2016. -She was treated with a right hemicolectomy with resection of terminal ileum.  There was a questionable liver lesion on image which was biopsied and negative for malignancy. -Postsurgical PET scan on 03/2016 showed negative for residual or metastatic disease.  There was a notation of RUL pulmonary nodule that was stable and non-hypermetabolic -CT of the chest on 01/2017 revealed stable lung nodule with benign etiology.  Also negative for recurrent or metastatic disease. -CT CAP done on 02/12/2018 showed 9 mm stable nodule in the posterior right upper lobe dating back to August 2017.  Stable and lacks metabolic activity on prior PET.  No evidence of metastatic disease in the thorax.  No evidence of metastatic disease involving the abdomen or pelvis.  Hepatic steatosis. - Colonoscopy done on 10/01/2018 showed the examinee did portion of the ileum  was normal.  There was significant looping of the colon.  External and internal hemorrhoids.  No mass in the rectum.  Repeat colonoscopy in 5 years. -CT CAP done on 09/24/2018 showed stable postsurgical changes from right hemo-colectomy with no evidence of anastomotic tumor recurrence.  No findings of metastatic disease.  Questionable mild diffuse bladder wall thickening may be due to to under distention. -Patient was referred to urology but has not kept any of her appointments. -CT CAP done on 09/21/2019 showed no evidence of recurrent or metastatic disease in the chest, abdomen or pelvis.  Stable benign 9 mm pulmonary nodule in the posterior right upper lobe. - Labs done on 09/21/2019 showed potassium 4.3, creatinine 0.88, WBC 6.5, hemoglobin 12.0, platelets 266, CEA 9.0 this is increased since last visit. -Patient will return in 6 months with repeat labs and scans.  2.  Breast calcifications: - Patient had a mammogram on 08/2018 that was BI-RADS Category 0 suspicious. - Patient had a right breast biopsy done on 09/23/2018 that showed fibrocystic changes. -Mammogram done on 09/27/2019 showed B RADS category 1 negative. - She will have a repeat mammogram in 1 year.  3.  Vitamin D deficiency: -Labs done on 09/27/2019 showed vitamin D level 9.66 -We will prescribe vitamin D 50,000 units weekly  4.  Noncompliance: -Patient has a history of being noncompliant with follow-up and referrals. -Patient lives in a group home and is under a caretaker and relies on them for transportation.      Orders placed this encounter:  Orders Placed This Encounter  Procedures  . CT ABDOMEN PELVIS W CONTRAST  . CT CHEST W CONTRAST  . Lactate dehydrogenase  . CBC with Differential/Platelet  . Comprehensive metabolic panel  . CEA  . Vitamin B12  . VITAMIN D 25 Hydroxy (Vit-D Deficiency, Fractures)      Francene Finders, FNP-C [place] [phone]

## 2019-09-30 NOTE — Assessment & Plan Note (Addendum)
1.  Stage II adenocarcinoma of the colon: - Patient was diagnosed 01/2016. -She was treated with a right hemicolectomy with resection of terminal ileum.  There was a questionable liver lesion on image which was biopsied and negative for malignancy. -Postsurgical PET scan on 03/2016 showed negative for residual or metastatic disease.  There was a notation of RUL pulmonary nodule that was stable and non-hypermetabolic -CT of the chest on 01/2017 revealed stable lung nodule with benign etiology.  Also negative for recurrent or metastatic disease. -CT CAP done on 02/12/2018 showed 9 mm stable nodule in the posterior right upper lobe dating back to August 2017.  Stable and lacks metabolic activity on prior PET.  No evidence of metastatic disease in the thorax.  No evidence of metastatic disease involving the abdomen or pelvis.  Hepatic steatosis. - Colonoscopy done on 10/01/2018 showed the examinee did portion of the ileum was normal.  There was significant looping of the colon.  External and internal hemorrhoids.  No mass in the rectum.  Repeat colonoscopy in 5 years. -CT CAP done on 09/24/2018 showed stable postsurgical changes from right hemo-colectomy with no evidence of anastomotic tumor recurrence.  No findings of metastatic disease.  Questionable mild diffuse bladder wall thickening may be due to to under distention. -Patient was referred to urology but has not kept any of her appointments. -CT CAP done on 09/21/2019 showed no evidence of recurrent or metastatic disease in the chest, abdomen or pelvis.  Stable benign 9 mm pulmonary nodule in the posterior right upper lobe. - Labs done on 09/21/2019 showed potassium 4.3, creatinine 0.88, WBC 6.5, hemoglobin 12.0, platelets 266, CEA 9.0 this is increased since last visit. -Patient will return in 6 months with repeat labs and scans.  2.  Breast calcifications: - Patient had a mammogram on 08/2018 that was BI-RADS Category 0 suspicious. - Patient had a right  breast biopsy done on 09/23/2018 that showed fibrocystic changes. -Mammogram done on 09/27/2019 showed B RADS category 1 negative. - She will have a repeat mammogram in 1 year.  3.  Vitamin D deficiency: -Labs done on 09/27/2019 showed vitamin D level 9.66 -We will prescribe vitamin D 50,000 units weekly  4.  Noncompliance: -Patient has a history of being noncompliant with follow-up and referrals. -Patient lives in a group home and is under a caretaker and relies on them for transportation.

## 2019-10-13 ENCOUNTER — Ambulatory Visit: Payer: Medicare Other | Attending: Internal Medicine

## 2019-10-13 DIAGNOSIS — Z23 Encounter for immunization: Secondary | ICD-10-CM

## 2019-10-13 NOTE — Progress Notes (Signed)
   Covid-19 Vaccination Clinic  Name:  EXIE HEIKKILA    MRN: [id number] DOB: 1956-08-02  10/13/2019  Ms. Hartinger was observed post Covid-19 immunization for 15 minutes without incident. She was provided with Vaccine Information Sheet and instruction to access the V-Safe system.   Ms. Miskiewicz was instructed to call 911 with any severe reactions post vaccine: Marland Kitchen Difficulty breathing  . Swelling of face and throat  . A fast heartbeat  . A bad rash all over body  . Dizziness and weakness   Immunizations Administered    Name Date Dose VIS Date Route   Moderna COVID-19 Vaccine 10/13/2019  2:34 PM 0.5 mL 07/06/2019 Intramuscular   Manufacturer: Moderna   Lot: [id number]   [id number]

## 2019-10-19 ENCOUNTER — Encounter: Payer: Self-pay | Admitting: General Surgery

## 2019-10-19 ENCOUNTER — Ambulatory Visit (INDEPENDENT_AMBULATORY_CARE_PROVIDER_SITE_OTHER): Payer: Medicare Other | Admitting: General Surgery

## 2019-10-19 ENCOUNTER — Other Ambulatory Visit: Payer: Self-pay

## 2019-10-19 VITALS — BP 109/54 | HR 51 | Temp 97.6°F | Resp 14 | Ht 64.0 in | Wt 161.0 lb

## 2019-10-19 DIAGNOSIS — C189 Malignant neoplasm of colon, unspecified: Secondary | ICD-10-CM

## 2019-10-19 NOTE — Patient Instructions (Signed)
Implanted Port Removal  Implanted port removal is a procedure to remove the port and catheter that are implanted under your skin. The port is a small disc under your skin that can be punctured with a needle. It is connected to a vein in your chest or neck by a small flexible tube (catheter). The implanted port is used to give medicines for treatments, and it may also be used to take blood samples. Your health care provider will remove the implanted port if:  You no longer need it for treatment.  It is not working properly.  The area around it gets infected. Tell a health care provider about:  Any allergies you have.  All medicines you are taking, including vitamins, herbs, eye drops, creams, and over-the-counter medicines.  Any problems you or family members have had with anesthetic medicines.  Any blood disorders you have.  Any surgeries you have had.  Any medical conditions you have.  Whether you are pregnant or may be pregnant. What are the risks? Generally, this is a safe procedure. However, problems may occur, including:  Infection.  Bleeding.  Allergic reactions to anesthetic medicines.  Damage to nerves or blood vessels. What happens before the procedure? Medicines  Ask your health care provider about: ? Changing or stopping your regular medicines. This is especially important if you are taking diabetes medicines or blood thinners. ? Taking medicines such as aspirin and ibuprofen. These medicines can thin your blood. Do not take these medicines unless your health care provider tells you to take them. ? Taking over-the-counter medicines, vitamins, herbs, and supplements. General instructions  You will have: ? A physical exam. ? Blood tests. ? Imaging tests, including a chest X-ray.  Follow instructions from your health care provider about eating or drinking restrictions.  Ask your health care provider how your surgical site will be marked or identified.  Ask  your health care provider what steps will be taken to help prevent infection. These may include: ? Removing hair at the surgery site. ? Washing skin with a germ-killing soap. ? Antibiotic medicine.  Plan to have someone take you home from the hospital or clinic.  If you will be going home right after the procedure, plan to have a responsible adult care for you for at least 24 hours after you leave the hospital or clinic. This is important. What happens during the procedure?  You may be given one or more of the following: ? A medicine to help you relax (sedative). ? A medicine to numb the area (local anesthetic).  A small incision will be made at the site of your implanted port.  The implanted port and the catheter that has been inside your vein will be gently removed.  The port and catheter will be inspected to make sure that all the parts have been removed. Part of the catheter may be tested for bacteria.  The incision will be closed with stitches (sutures), adhesive strips, or skin glue.  A bandage (dressing) will be placed over the incision. The health care provider may apply gentle pressure over the dressing for about 5 minutes. The procedure may vary among health care providers and hospitals. What happens after the procedure?  Your blood pressure, heart rate, breathing rate, and blood oxygen level will be monitored until you leave the hospital or clinic.  You will be monitored to make sure that there is no bleeding from the site where the port was removed.  Do not drive for 24 hours   if you were given a sedative during your procedure. Summary  Implanted port removal is a procedure to remove the port and catheter that are implanted under your skin.  Before the procedure, follow your health care provider's instructions about changing or stopping your regular medicines. This is especially important if you are taking diabetes medicines or blood thinners.  If you will be going  home right after the procedure, plan to have a responsible adult care for you for at least 24 hours after you leave the hospital or clinic. This information is not intended to replace advice given to you by your health care provider. Make sure you discuss any questions you have with your health care provider. Document Revised: 09/04/2017 Document Reviewed: 09/04/2017 Elsevier Patient Education  2020 [employer]  

## 2019-10-20 NOTE — Progress Notes (Signed)
Mary Wells; [id number]; [birth year]   HPI Patient is a 64 year old black female who was referred to my care by Dr. Delton Coombes and Dr. Legrand Rams for Port-A-Cath removal.  She was treated for colon carcinoma and now has finished with her chemotherapy.  She is referred for removal of her Port-A-Cath.  Patient denies any pain.  She has 0 out of 10 pain at the port site. Past Medical History:  Diagnosis Date  . Adenocarcinoma of colon ([place])   . Allergy   . Diabetes mellitus without complication ([place]) 123456  . GERD (gastroesophageal reflux disease)   . Iron deficiency anemia 09/18/2013  . Schizophrenia [facility])     Past Surgical History:  Procedure Laterality Date  . BIOPSY  01/24/2016   Procedure: BIOPSY;  Surgeon: Daneil Dolin, MD;  [facility];  Service: Endoscopy;;  cecal mass  . COLONOSCOPY  04/21/2006   SLF: limited colonoscopy due to a poor bowel prep   . COLONOSCOPY  02/22/2009   SLF: poor bowel prep/Formed stools in the right colon which limited the extent of the exam/The scope was passed to approximately the proximal transverse colon.  No polyps, masses, inflammatory changes, diverticular/normal rectum  . COLONOSCOPY WITH PROPOFOL N/A 01/24/2016   Dr. Gala Romney: large fungating, exophytic tumor arising out of the base of the cecum/appendix. Partially obscuring the ileocecal valve  . COLONOSCOPY WITH PROPOFOL N/A 10/01/2018   Procedure: COLONOSCOPY WITH PROPOFOL;  Surgeon: Danie Binder, MD;  [facility];  Service: Endoscopy;  Laterality: N/A;  . EXCISIONAL HEMORRHOIDECTOMY    . LIVER BIOPSY N/A 01/29/2016   Procedure: LIVER BIOPSY;  Surgeon: Aviva Signs, MD;  Location: [facility];  Service: General;  Laterality: N/A;  . PARTIAL COLECTOMY N/A 01/29/2016   Procedure: PARTIAL COLECTOMY ;  Surgeon: Aviva Signs, MD;  Location: [facility] OR[facility];  Service: General;  Laterality: N/A;  . PORTACATH PLACEMENT Left 01/29/2016   Procedure: INSERTION PORT-A-CATH;  Surgeon: Aviva Signs, MD;   Location: [facility] OR[facility];  Service: General;  Laterality: Left;  left subclavian    Family History  Problem Relation Age of Onset  . Leukemia Paternal Grandfather   . Leukemia Cousin   . Colon cancer Neg Hx     Current Outpatient Medications on File Prior to Visit  Medication Sig Dispense Refill  . benztropine (COGENTIN) 1 MG tablet Take 1 mg by mouth 2 (two) times daily.    . clonazePAM (KLONOPIN) 0.5 MG tablet Take 0.5 mg by mouth 2 (two) times daily.     . divalproex (DEPAKOTE ER) 250 MG 24 hr tablet Take 250 mg by mouth at bedtime.    . docusate sodium (COLACE) 100 MG capsule Take 1 capsule (100 mg total) by mouth 2 (two) times daily. 60 capsule 2  . ergocalciferol (VITAMIN D2) 1.25 MG (50000 UT) capsule Take 1 capsule (50,000 Units total) by mouth once a week. 16 capsule 4  . famotidine (PEPCID) 20 MG tablet Take 1 tablet (20 mg total) by mouth at bedtime.    . Ferrous Sulfate (IRON HIGH-POTENCY) 325 MG TABS Take 325 mg by mouth 2 (two) times daily with a meal.    . fluPHENAZine (PROLIXIN) 5 MG tablet two by mouth two times per day    . loratadine (CLARITIN) 10 MG tablet Take 10 mg by mouth daily.    . metFORMIN (GLUCOPHAGE) 500 MG tablet Take 500 mg by mouth 2 (two) times daily with a meal.     . pantoprazole (PROTONIX) 40  MG tablet Take 1 tablet (40 mg total) by mouth 2 (two) times daily. 60 tablet 1  . polyethylene glycol (MIRALAX) 17 g packet Take 17 g by mouth daily as needed for mild constipation. 28 each 0  . PSYLLIUM FIBER PO Take 1 capsule by mouth 2 (two) times daily.     . simvastatin (ZOCOR) 20 MG tablet Take 20 mg by mouth daily.     No current facility-administered medications on file prior to visit.    No Known Allergies  Social History   Substance and Sexual Activity  Alcohol Use No  . Alcohol/week: 0.0 standard drinks    Social History   Tobacco Use  Smoking Status Current Every Day Smoker  . Packs/day: 0.25  . Years: 19.00  . Pack years: 4.75  . Types:  Cigarettes  Smokeless Tobacco Never Used  Tobacco Comment   3 cigs daily    Review of Systems  Constitutional: Negative.   HENT: Negative.   Eyes: Negative.   Respiratory: Negative.   Cardiovascular: Negative.   Gastrointestinal: Negative.   Genitourinary: Negative.   Musculoskeletal: Negative.   Skin: Negative.   Neurological: Negative.   Endo/Heme/Allergies: Negative.   Psychiatric/Behavioral: Negative.     Objective   Vitals:   10/19/19 1435  BP: (!) 109/54  Pulse: (!) 51  Resp: 14  Temp: 97.6 F (36.4 C)  SpO2: 98%    Physical Exam Vitals reviewed.  Constitutional:      Appearance: Normal appearance. She is normal weight. She is not ill-appearing.  HENT:     Head: Normocephalic and atraumatic.  Cardiovascular:     Rate and Rhythm: Normal rate and regular rhythm.     Heart sounds: Normal heart sounds. No murmur. No friction rub. No gallop.   Pulmonary:     Effort: Pulmonary effort is normal. No respiratory distress.     Breath sounds: Normal breath sounds. No stridor. No wheezing, rhonchi or rales.     Comments: Port-A-Cath in place left upper chest. Skin:    General: Skin is warm and dry.  Neurological:     Mental Status: She is alert and oriented to person, place, and time.     Assessment  Colon carcinoma, finished with chemotherapy Plan   As patient lives in a group home, her caregiver will call to schedule Port-A-Cath removal in the minor procedure room.  The risks and benefits of the procedure were fully explained to the patient, who gave informed consent.

## 2019-10-25 NOTE — H&P (Signed)
Mary Wells; [id number]; Nov 03, 1955   HPI Patient is a 64 year old black female who was referred to my care by Dr. Delton Coombes and Dr. Legrand Rams for Port-A-Cath removal.  She was treated for colon carcinoma and now has finished with her chemotherapy.  She is referred for removal of her Port-A-Cath.  Patient denies any pain.  She has 0 out of 10 pain at the port site. Past Medical History:  Diagnosis Date  . Adenocarcinoma of colon ([place])   . Allergy   . Diabetes mellitus without complication ([place]) 123456  . GERD (gastroesophageal reflux disease)   . Iron deficiency anemia 09/18/2013  . Schizophrenia [facility])     Past Surgical History:  Procedure Laterality Date  . BIOPSY  01/24/2016   Procedure: BIOPSY;  Surgeon: Daneil Dolin, MD;  [facility];  Service: Endoscopy;;  cecal mass  . COLONOSCOPY  04/21/2006   SLF: limited colonoscopy due to a poor bowel prep   . COLONOSCOPY  02/22/2009   SLF: poor bowel prep/Formed stools in the right colon which limited the extent of the exam/The scope was passed to approximately the proximal transverse colon.  No polyps, masses, inflammatory changes, diverticular/normal rectum  . COLONOSCOPY WITH PROPOFOL N/A 01/24/2016   Dr. Gala Romney: large fungating, exophytic tumor arising out of the base of the cecum/appendix. Partially obscuring the ileocecal valve  . COLONOSCOPY WITH PROPOFOL N/A 10/01/2018   Procedure: COLONOSCOPY WITH PROPOFOL;  Surgeon: Danie Binder, MD;  [facility];  Service: Endoscopy;  Laterality: N/A;  . EXCISIONAL HEMORRHOIDECTOMY    . LIVER BIOPSY N/A 01/29/2016   Procedure: LIVER BIOPSY;  Surgeon: Aviva Signs, MD;  Location: [facility];  Service: General;  Laterality: N/A;  . PARTIAL COLECTOMY N/A 01/29/2016   Procedure: PARTIAL COLECTOMY ;  Surgeon: Aviva Signs, MD;  Location: [facility];  Service: General;  Laterality: N/A;  . PORTACATH PLACEMENT Left 01/29/2016   Procedure: INSERTION PORT-A-CATH;  Surgeon: Aviva Signs, MD;   Location: [facility];  Service: General;  Laterality: Left;  left subclavian    Family History  Problem Relation Age of Onset  . Leukemia Paternal Grandfather   . Leukemia Cousin   . Colon cancer Neg Hx     Current Outpatient Medications on File Prior to Visit  Medication Sig Dispense Refill  . benztropine (COGENTIN) 1 MG tablet Take 1 mg by mouth 2 (two) times daily.    . clonazePAM (KLONOPIN) 0.5 MG tablet Take 0.5 mg by mouth 2 (two) times daily.     . divalproex (DEPAKOTE ER) 250 MG 24 hr tablet Take 250 mg by mouth at bedtime.    . docusate sodium (COLACE) 100 MG capsule Take 1 capsule (100 mg total) by mouth 2 (two) times daily. 60 capsule 2  . ergocalciferol (VITAMIN D2) 1.25 MG (50000 UT) capsule Take 1 capsule (50,000 Units total) by mouth once a week. 16 capsule 4  . famotidine (PEPCID) 20 MG tablet Take 1 tablet (20 mg total) by mouth at bedtime.    . Ferrous Sulfate (IRON HIGH-POTENCY) 325 MG TABS Take 325 mg by mouth 2 (two) times daily with a meal.    . fluPHENAZine (PROLIXIN) 5 MG tablet two by mouth two times per day    . loratadine (CLARITIN) 10 MG tablet Take 10 mg by mouth daily.    . metFORMIN (GLUCOPHAGE) 500 MG tablet Take 500 mg by mouth 2 (two) times daily with a meal.     . pantoprazole (PROTONIX) 40  MG tablet Take 1 tablet (40 mg total) by mouth 2 (two) times daily. 60 tablet 1  . polyethylene glycol (MIRALAX) 17 g packet Take 17 g by mouth daily as needed for mild constipation. 28 each 0  . PSYLLIUM FIBER PO Take 1 capsule by mouth 2 (two) times daily.     . simvastatin (ZOCOR) 20 MG tablet Take 20 mg by mouth daily.     No current facility-administered medications on file prior to visit.    No Known Allergies  Social History   Substance and Sexual Activity  Alcohol Use No  . Alcohol/week: 0.0 standard drinks    Social History   Tobacco Use  Smoking Status Current Every Day Smoker  . Packs/day: 0.25  . Years: 19.00  . Pack years: 4.75  . Types:  Cigarettes  Smokeless Tobacco Never Used  Tobacco Comment   3 cigs daily    Review of Systems  Constitutional: Negative.   HENT: Negative.   Eyes: Negative.   Respiratory: Negative.   Cardiovascular: Negative.   Gastrointestinal: Negative.   Genitourinary: Negative.   Musculoskeletal: Negative.   Skin: Negative.   Neurological: Negative.   Endo/Heme/Allergies: Negative.   Psychiatric/Behavioral: Negative.     Objective   Vitals:   10/19/19 1435  BP: (!) 109/54  Pulse: (!) 51  Resp: 14  Temp: 97.6 F (36.4 C)  SpO2: 98%    Physical Exam Vitals reviewed.  Constitutional:      Appearance: Normal appearance. She is normal weight. She is not ill-appearing.  HENT:     Head: Normocephalic and atraumatic.  Cardiovascular:     Rate and Rhythm: Normal rate and regular rhythm.     Heart sounds: Normal heart sounds. No murmur. No friction rub. No gallop.   Pulmonary:     Effort: Pulmonary effort is normal. No respiratory distress.     Breath sounds: Normal breath sounds. No stridor. No wheezing, rhonchi or rales.     Comments: Port-A-Cath in place left upper chest. Skin:    General: Skin is warm and dry.  Neurological:     Mental Status: She is alert and oriented to person, place, and time.     Assessment  Colon carcinoma, finished with chemotherapy Plan   As patient lives in a group home, her caregiver will call to schedule Port-A-Cath removal in the minor procedure room.  The risks and benefits of the procedure were fully explained to the patient, who gave informed consent.

## 2019-11-01 ENCOUNTER — Encounter (HOSPITAL_COMMUNITY)
Admission: RE | Disposition: A | Discharge status: Home / Self Care | Payer: Self-pay | Source: Home / Self Care | Attending: General Surgery

## 2019-11-01 ENCOUNTER — Ambulatory Visit (HOSPITAL_COMMUNITY)
Admission: RE | Admit: 2019-11-01 | Discharge: 2019-11-01 | Disposition: A | Discharge status: Home / Self Care | Payer: Medicare Other | Attending: General Surgery | Admitting: General Surgery

## 2019-11-01 DIAGNOSIS — Z452 Encounter for adjustment and management of vascular access device: Secondary | ICD-10-CM | POA: Diagnosis not present

## 2019-11-01 DIAGNOSIS — Z800 Family history of malignant neoplasm of digestive organs: Secondary | ICD-10-CM | POA: Diagnosis not present

## 2019-11-01 DIAGNOSIS — Z7984 Long term (current) use of oral hypoglycemic drugs: Secondary | ICD-10-CM | POA: Insufficient documentation

## 2019-11-01 DIAGNOSIS — C189 Malignant neoplasm of colon, unspecified: Secondary | ICD-10-CM

## 2019-11-01 DIAGNOSIS — E119 Type 2 diabetes mellitus without complications: Secondary | ICD-10-CM | POA: Diagnosis not present

## 2019-11-01 DIAGNOSIS — F209 Schizophrenia, unspecified: Secondary | ICD-10-CM | POA: Insufficient documentation

## 2019-11-01 DIAGNOSIS — Z9049 Acquired absence of other specified parts of digestive tract: Secondary | ICD-10-CM | POA: Diagnosis not present

## 2019-11-01 DIAGNOSIS — F17210 Nicotine dependence, cigarettes, uncomplicated: Secondary | ICD-10-CM | POA: Insufficient documentation

## 2019-11-01 DIAGNOSIS — Z79899 Other long term (current) drug therapy: Secondary | ICD-10-CM | POA: Insufficient documentation

## 2019-11-01 DIAGNOSIS — K219 Gastro-esophageal reflux disease without esophagitis: Secondary | ICD-10-CM | POA: Diagnosis not present

## 2019-11-01 DIAGNOSIS — D509 Iron deficiency anemia, unspecified: Secondary | ICD-10-CM | POA: Insufficient documentation

## 2019-11-01 DIAGNOSIS — Z806 Family history of leukemia: Secondary | ICD-10-CM | POA: Diagnosis not present

## 2019-11-01 HISTORY — PX: PORT-A-CATH REMOVAL: SHX5289

## 2019-11-01 LAB — GLUCOSE, CAPILLARY: Glucose-Capillary: 90 mg/dL (ref 70–99)

## 2019-11-01 SURGERY — MINOR REMOVAL PORT-A-CATH
Anesthesia: LOCAL

## 2019-11-01 MED ORDER — CHLORHEXIDINE GLUCONATE CLOTH 2 % EX PADS: 6.0000 | MEDICATED_PAD | Freq: Once | CUTANEOUS | Status: DC

## 2019-11-01 MED ORDER — LIDOCAINE HCL (PF) 1 % IJ SOLN
INTRAMUSCULAR | Status: AC
  Filled 2019-11-01: qty 30

## 2019-11-01 MED ORDER — LIDOCAINE HCL (PF) 1 % IJ SOLN
INTRAMUSCULAR | Status: DC | PRN
  Administered 2019-11-01: 7 mg

## 2019-11-01 SURGICAL SUPPLY — 21 items
APPLICATOR CHLORAPREP 10.5 ORG (MISCELLANEOUS) ×2 IMPLANT
CLOTH BEACON ORANGE TIMEOUT ST (SAFETY) ×2 IMPLANT
COVER WAND RF STERILE (DRAPES) ×2 IMPLANT
DECANTER SPIKE VIAL GLASS SM (MISCELLANEOUS) ×2 IMPLANT
DERMABOND ADVANCED (GAUZE/BANDAGES/DRESSINGS) ×1
DERMABOND ADVANCED .7 DNX12 (GAUZE/BANDAGES/DRESSINGS) ×1 IMPLANT
DRAPE HALF SHEET 40X57 (DRAPES) ×2 IMPLANT
ELECT REM PT RETURN 9FT ADLT (ELECTROSURGICAL) ×2
ELECTRODE REM PT RTRN 9FT ADLT (ELECTROSURGICAL) ×1 IMPLANT
GLOVE BIOGEL PI IND STRL 7.0 (GLOVE) ×2 IMPLANT
GLOVE BIOGEL PI INDICATOR 7.0 (GLOVE) ×2
GLOVE SURG SS PI 7.5 STRL IVOR (GLOVE) ×2 IMPLANT
GOWN STRL REUS W/TWL LRG LVL3 (GOWN DISPOSABLE) ×4 IMPLANT
NEEDLE HYPO 25X1 1.5 SAFETY (NEEDLE) ×2 IMPLANT
PENCIL SMOKE EVACUATOR COATED (MISCELLANEOUS) IMPLANT
SPONGE GAUZE 2X2 8PLY STRL LF (GAUZE/BANDAGES/DRESSINGS) ×2 IMPLANT
SUT MNCRL AB 4-0 PS2 18 (SUTURE) ×2 IMPLANT
SUT VIC AB 3-0 SH 27 (SUTURE) ×2
SUT VIC AB 3-0 SH 27X BRD (SUTURE) ×1 IMPLANT
SYR CONTROL 10ML LL (SYRINGE) ×2 IMPLANT
TOWEL OR 17X26 4PK STRL BLUE (TOWEL DISPOSABLE) ×2 IMPLANT

## 2019-11-01 NOTE — Discharge Instructions (Signed)
Implanted Port Removal, Care After This sheet gives you information about how to care for yourself after your procedure. Your health care provider may also give you more specific instructions. If you have problems or questions, contact your health care provider. What can I expect after the procedure? After the procedure, it is common to have:  Soreness or pain near your incision.  Some swelling or bruising near your incision. Follow these instructions at home: Medicines  Take over-the-counter and prescription medicines only as told by your health care provider.  If you were prescribed an antibiotic medicine, take it as told by your health care provider. Do not stop taking the antibiotic even if you start to feel better. Bathing  Do not take baths, swim, or use a hot tub until your health care provider approves. Ask your health care provider if you can take showers. You may only be allowed to take sponge baths. Incision care   Follow instructions from your health care provider about how to take care of your incision. Make sure you: ? Wash your hands with soap and water before you change your bandage (dressing). If soap and water are not available, use hand sanitizer. ? Change your dressing as told by your health care provider. ? Keep your dressing dry. ? Leave stitches (sutures), skin glue, or adhesive strips in place. These skin closures may need to stay in place for 2 weeks or longer. If adhesive strip edges start to loosen and curl up, you may trim the loose edges. Do not remove adhesive strips completely unless your health care provider tells you to do that.  Check your incision area every day for signs of infection. Check for: ? More redness, swelling, or pain. ? More fluid or blood. ? Warmth. ? Pus or a bad smell. Driving   Do not drive for 24 hours if you were given a medicine to help you relax (sedative) during your procedure.  If you did not receive a sedative, ask your  health care provider when it is safe to drive. Activity  Return to your normal activities as told by your health care provider. Ask your health care provider what activities are safe for you.  Do not lift anything that is heavier than 10 lb (4.5 kg), or the limit that you are told, until your health care provider says that it is safe.  Do not do activities that involve lifting your arms over your head. General instructions  Do not use any products that contain nicotine or tobacco, such as cigarettes and e-cigarettes. These can delay healing. If you need help quitting, ask your health care provider.  Keep all follow-up visits as told by your health care provider. This is important. Contact a health care provider if:  You have more redness, swelling, or pain around your incision.  You have more fluid or blood coming from your incision.  Your incision feels warm to the touch.  You have pus or a bad smell coming from your incision.  You have pain that is not relieved by your pain medicine. Get help right away if you have:  A fever or chills.  Chest pain.  Difficulty breathing. Summary  After the procedure, it is common to have pain, soreness, swelling, or bruising near your incision.  If you were prescribed an antibiotic medicine, take it as told by your health care provider. Do not stop taking the antibiotic even if you start to feel better.  Do not drive for 24 hours   if you were given a sedative during your procedure.  Return to your normal activities as told by your health care provider. Ask your health care provider what activities are safe for you. This information is not intended to replace advice given to you by your health care provider. Make sure you discuss any questions you have with your health care provider. Document Revised: 09/04/2017 Document Reviewed: 09/04/2017 Elsevier Patient Education  2020 [employer]  

## 2019-11-01 NOTE — Op Note (Signed)
Patient:  ERIC PLANO  DOB:  1956-05-10  MRN:  [id number]   Preop Diagnosis: History of colon carcinoma, finished with chemotherapy  Postop Diagnosis: Same  Procedure: Port-A-Cath removal  Surgeon: Aviva Signs, MD  Anes: Local  Indications: Patient is a 64 year old black female with a history of colon cancer who now presents for Port-A-Cath removal as she has finished with her chemotherapy.  The risks and benefits of the procedure including bleeding and infection were fully explained to the patient, who gave informed consent.  Procedure note: The patient was placed in supine position.  The left upper chest was prepped and draped using the usual sterile technique with ChloraPrep.  Surgical site confirmation was performed.  1% Xylocaine was used for local anesthesia.  An incision was made through the previous surgical incision site.  The dissection was taken down to the Port-A-Cath.  The Port-A-Cath was removed in total without difficulty.  It was disposed of.  The subcutaneous layer was reapproximated using a 3-0 Vicryl interrupted suture.  The fibers catheter sheath was ligated using a 3-0 Vicryl tie.  The skin was closed using a 4-0 Monocryl subcuticular suture.  Dermabond was applied.  All tape and needle counts were correct at the end of the procedure.  The patient tolerated the procedure well and was discharged from the minor procedure room in good and stable condition.  Complications: None  EBL: Minimal  Specimen: None

## 2019-11-01 NOTE — Interval H&P Note (Signed)
History and Physical Interval Note:  11/01/2019 10:08 AM  Mary Wells  has presented today for surgery, with the diagnosis of Colon Cancer.  The various methods of treatment have been discussed with the patient and family. After consideration of risks, benefits and other options for treatment, the patient has consented to  Procedure(s): MINOR REMOVAL PORT-A-CATH (N/A) as a surgical intervention.  The patient's history has been reviewed, patient examined, no change in status, stable for surgery.  I have reviewed the patient's chart and labs.  Questions were answered to the patient's satisfaction.     Aviva Signs

## 2020-01-07 ENCOUNTER — Other Ambulatory Visit (HOSPITAL_COMMUNITY): Payer: Self-pay | Admitting: Nurse Practitioner

## 2020-01-07 DIAGNOSIS — E559 Vitamin D deficiency, unspecified: Secondary | ICD-10-CM

## 2020-03-22 ENCOUNTER — Inpatient Hospital Stay (HOSPITAL_COMMUNITY): Payer: Medicare Other | Attending: Hematology

## 2020-03-22 ENCOUNTER — Other Ambulatory Visit: Payer: Self-pay

## 2020-03-22 DIAGNOSIS — F17210 Nicotine dependence, cigarettes, uncomplicated: Secondary | ICD-10-CM | POA: Insufficient documentation

## 2020-03-22 DIAGNOSIS — E559 Vitamin D deficiency, unspecified: Secondary | ICD-10-CM | POA: Diagnosis not present

## 2020-03-22 DIAGNOSIS — Z79899 Other long term (current) drug therapy: Secondary | ICD-10-CM | POA: Insufficient documentation

## 2020-03-22 DIAGNOSIS — Z7984 Long term (current) use of oral hypoglycemic drugs: Secondary | ICD-10-CM | POA: Insufficient documentation

## 2020-03-22 DIAGNOSIS — C183 Malignant neoplasm of hepatic flexure: Secondary | ICD-10-CM | POA: Diagnosis not present

## 2020-03-22 DIAGNOSIS — C189 Malignant neoplasm of colon, unspecified: Secondary | ICD-10-CM

## 2020-03-22 LAB — CBC WITH DIFFERENTIAL/PLATELET
Abs Immature Granulocytes: 0.01 10*3/uL (ref 0.00–0.07)
Basophils Absolute: 0 10*3/uL (ref 0.0–0.1)
Basophils Relative: 1 %
Eosinophils Absolute: 0.1 10*3/uL (ref 0.0–0.5)
Eosinophils Relative: 2 %
HCT: 38.2 % (ref 36.0–46.0)
Hemoglobin: 11.9 g/dL — ABNORMAL LOW (ref 12.0–15.0)
Immature Granulocytes: 0 %
Lymphocytes Relative: 27 %
Lymphs Abs: 1.3 10*3/uL (ref 0.7–4.0)
MCH: 25.9 pg — ABNORMAL LOW (ref 26.0–34.0)
MCHC: 31.2 g/dL (ref 30.0–36.0)
MCV: 83.2 fL (ref 80.0–100.0)
Monocytes Absolute: 0.5 10*3/uL (ref 0.1–1.0)
Monocytes Relative: 10 %
Neutro Abs: 2.9 10*3/uL (ref 1.7–7.7)
Neutrophils Relative %: 60 %
Platelets: 177 10*3/uL (ref 150–400)
RBC: 4.59 MIL/uL (ref 3.87–5.11)
RDW: 16.2 % — ABNORMAL HIGH (ref 11.5–15.5)
WBC: 4.8 10*3/uL (ref 4.0–10.5)
nRBC: 0 % (ref 0.0–0.2)

## 2020-03-22 LAB — COMPREHENSIVE METABOLIC PANEL
ALT: 11 U/L (ref 0–44)
AST: 20 U/L (ref 15–41)
Albumin: 3.9 g/dL (ref 3.5–5.0)
Alkaline Phosphatase: 45 U/L (ref 38–126)
Anion gap: 8 (ref 5–15)
BUN: 20 mg/dL (ref 8–23)
CO2: 25 mmol/L (ref 22–32)
Calcium: 9.6 mg/dL (ref 8.9–10.3)
Chloride: 104 mmol/L (ref 98–111)
Creatinine, Ser: 0.98 mg/dL (ref 0.44–1.00)
GFR calc Af Amer: >60 mL/min (ref >60)
GFR calc non Af Amer: >60 mL/min (ref >60)
Glucose, Bld: 112 mg/dL — ABNORMAL HIGH (ref 70–99)
Potassium: 4.3 mmol/L (ref 3.5–5.1)
Sodium: 137 mmol/L (ref 135–145)
Total Bilirubin: 0.3 mg/dL (ref 0.3–1.2)
Total Protein: 7.7 g/dL (ref 6.5–8.1)

## 2020-03-22 LAB — VITAMIN D 25 HYDROXY (VIT D DEFICIENCY, FRACTURES): Vit D, 25-Hydroxy: 93.47 ng/mL (ref 30–100)

## 2020-03-22 LAB — VITAMIN B12: Vitamin B-12: 211 pg/mL (ref 180–914)

## 2020-03-22 LAB — LACTATE DEHYDROGENASE: LDH: 122 U/L (ref 98–192)

## 2020-03-23 LAB — CEA: CEA: 10 ng/mL — ABNORMAL HIGH (ref 0.0–4.7)

## 2020-03-29 ENCOUNTER — Other Ambulatory Visit: Payer: Self-pay

## 2020-03-29 ENCOUNTER — Ambulatory Visit (HOSPITAL_COMMUNITY)
Admission: RE | Admit: 2020-03-29 | Discharge: 2020-03-29 | Disposition: A | Discharge status: Home / Self Care | Payer: Medicare Other | Source: Ambulatory Visit | Attending: Nurse Practitioner | Admitting: Nurse Practitioner

## 2020-03-29 DIAGNOSIS — C189 Malignant neoplasm of colon, unspecified: Secondary | ICD-10-CM

## 2020-03-29 MED ORDER — IOHEXOL 300 MG/ML  SOLN
100.0000 mL | Freq: Once | INTRAMUSCULAR | Status: AC | PRN
  Administered 2020-03-29: 100 mL via INTRAVENOUS

## 2020-03-31 ENCOUNTER — Inpatient Hospital Stay (HOSPITAL_BASED_OUTPATIENT_CLINIC_OR_DEPARTMENT_OTHER): Payer: Medicare Other | Admitting: Nurse Practitioner

## 2020-03-31 ENCOUNTER — Other Ambulatory Visit: Payer: Self-pay

## 2020-03-31 VITALS — BP 115/52 | HR 48 | Temp 98.4°F | Resp 16 | Wt 156.6 lb

## 2020-03-31 DIAGNOSIS — C189 Malignant neoplasm of colon, unspecified: Secondary | ICD-10-CM | POA: Diagnosis not present

## 2020-03-31 DIAGNOSIS — C183 Malignant neoplasm of hepatic flexure: Secondary | ICD-10-CM | POA: Diagnosis not present

## 2020-03-31 NOTE — Progress Notes (Signed)
[place] [place], [place] [address]   CLINIC:  Medical Oncology/Hematology  PCP:  Rosita Fire, MD [place] [address] [phone]   REASON FOR VISIT: Follow-up for colon cancer   CURRENT THERAPY: Observation  BRIEF ONCOLOGIC HISTORY:  Oncology History  Malignant neoplasm of colon ([place])  01/25/2016 Imaging   CT C/A/P large 9 cm mass within the colon at level of hepatic flexure, enlarged mesenteric LN concerning for metastatic disease, There is a nonspecific 13 mm nodule within the right hemipelvis which may represent a metastatic deposit. Alternatively, given the appearance, this may represent a small amount of loculated fluid. There are nonspecific low-attenuation lesions within the liver, the majority which are subcentimeter in size. Metastatic disease is not excluded.    01/25/2016 Procedure   Colonoscopy on 6/22 with Large fungating, exophytic tumor appears to be arising out of the base of cecum / appendix. It's partially obscuring the ileocecal valve.   01/29/2016 Surgery   R hemicolectomy with resection of terminal ileum, liver biopsy with Dr. Arnoldo Morale   01/29/2016 Pathology Results   INVASIVE WELL DIFFERENTIATED ADENOCARCINOMA WITH ABUNDANT EXTRACELLULAR MUCIN, SPANNING 19 CM IN GREATEST DIMENSION. - TUMOR INVADES THROUGH MUSCULARIS PROPRIA THROUGH THE SEROSA OF ONE BOWEL SEGMENT INTO A SECOND ADHESED SEGMENT OF BOWEL. - MARGINS ARE NEGATIVE.No extramural satellite tumor nodules seen, 0/27 LN, Liver biopsy with benign liver tissue   03/15/2016 PET scan   No abnormal metabolic activity status post interval right hemicolectomy. No evidence of residual or metastatic disease. The right upper lobe pulmonary nodule is stable and without hypermetabolic activity. This favors a benign etiology.     CANCER STAGING: Cancer Staging Malignant neoplasm of colon [facility]) Staging form: Colon and Rectum, AJCC 7th Edition -  Pathologic stage from 02/02/2016: Stage IIC (T4b, N0, cM0) - Signed by Baird Cancer, PA-C on 07/04/2016    INTERVAL HISTORY:  Mary Wells 64 y.o. female returns for routine follow-up for colon cancer.  Patient reports she is doing well since her last visit.  She denies any change in bowel habits.  She denies any bright red bleeding per rectum or melena.  She denies any abdominal pain. Denies any nausea, vomiting, or diarrhea. Denies any new pains. Had not noticed any recent bleeding such as epistaxis, hematuria or hematochezia. Denies recent chest pain on exertion, shortness of breath on minimal exertion, pre-syncopal episodes, or palpitations. Denies any numbness or tingling in hands or feet. Denies any recent fevers, infections, or recent hospitalizations. Patient reports appetite at 100% and energy level at 100%.  She is eating well maintain her weight this time.    REVIEW OF SYSTEMS:  Review of Systems  All other systems reviewed and are negative.    PAST MEDICAL/SURGICAL HISTORY:  Past Medical History:  Diagnosis Date  . Adenocarcinoma of colon ([place])   . Allergy   . Diabetes mellitus without complication ([place]) 5462  . GERD (gastroesophageal reflux disease)   . Iron deficiency anemia 09/18/2013  . Schizophrenia [facility])    Past Surgical History:  Procedure Laterality Date  . BIOPSY  01/24/2016   Procedure: BIOPSY;  Surgeon: Daneil Dolin, MD;  Location: [facility] ENDO SUITE;  Service: Endoscopy;;  cecal mass  . COLONOSCOPY  04/21/2006   SLF: limited colonoscopy due to a poor bowel prep   . COLONOSCOPY  02/22/2009   SLF: poor bowel prep/Formed stools in the right colon which limited the extent of the exam/The scope was passed  to approximately the proximal transverse colon.  No polyps, masses, inflammatory changes, diverticular/normal rectum  . COLONOSCOPY WITH PROPOFOL N/A 01/24/2016   Dr. Gala Romney: large fungating, exophytic tumor arising out of the base of the cecum/appendix. Partially  obscuring the ileocecal valve  . COLONOSCOPY WITH PROPOFOL N/A 10/01/2018   Procedure: COLONOSCOPY WITH PROPOFOL;  Surgeon: Danie Binder, MD;  Location: [facility] ENDO SUITE;  Service: Endoscopy;  Laterality: N/A;  . EXCISIONAL HEMORRHOIDECTOMY    . LIVER BIOPSY N/A 01/29/2016   Procedure: LIVER BIOPSY;  Surgeon: Aviva Signs, MD;  Location: [facility] ORS;  Service: General;  Laterality: N/A;  . PARTIAL COLECTOMY N/A 01/29/2016   Procedure: PARTIAL COLECTOMY ;  Surgeon: Aviva Signs, MD;  Location: [facility] ORS;  Service: General;  Laterality: N/A;  . PORT-A-CATH REMOVAL N/A 11/01/2019   Procedure: MINOR REMOVAL PORT-A-CATH;  Surgeon: Aviva Signs, MD;  Location: [facility] ORS;  Service: General;  Laterality: N/A;  . PORTACATH PLACEMENT Left 01/29/2016   Procedure: INSERTION PORT-A-CATH;  Surgeon: Aviva Signs, MD;  Location: [facility] ORS;  Service: General;  Laterality: Left;  left subclavian     SOCIAL HISTORY:  Social History   Socioeconomic History  . Marital status: Single    Spouse name: Lynnae Prude  . Number of children: 1  . Years of education: 70  . Highest education level: Not on file  Occupational History  . Not on file  Tobacco Use  . Smoking status: Current Every Day Smoker    Packs/day: 0.25    Years: 19.00    Pack years: 4.75    Types: Cigarettes  . Smokeless tobacco: Never Used  . Tobacco comment: 3 cigs daily  Substance and Sexual Activity  . Alcohol use: No    Alcohol/week: 0.0 standard drinks  . Drug use: No  . Sexual activity: Never  Other Topics Concern  . Not on file  Social History Narrative  . Not on file   Social Determinants of Health   Financial Resource Strain: Low Risk   . Difficulty of Paying Living Expenses: Not very hard  Food Insecurity: Food Insecurity Present  . Worried About Charity fundraiser in the Last Year: Sometimes true  . Ran Out of Food in the Last Year: Sometimes true  Transportation Needs: Unknown  . Lack of Transportation (Medical): Patient refused   . Lack of Transportation (Non-Medical): Patient refused  Physical Activity: Unknown  . Days of Exercise per Week: Patient refused  . Minutes of Exercise per Session: Patient refused  Stress: No Stress Concern Present  . Feeling of Stress : Only a little  Social Connections: Unknown  . Frequency of Communication with Friends and Family: Patient refused  . Frequency of Social Gatherings with Friends and Family: Patient refused  . Attends Religious Services: Patient refused  . Active Member of Clubs or Organizations: Patient refused  . Attends Archivist Meetings: Patient refused  . Marital Status: Patient refused  Intimate Partner Violence: Unknown  . Fear of Current or Ex-Partner: Patient refused  . Emotionally Abused: Patient refused  . Physically Abused: Patient refused  . Sexually Abused: Patient refused    FAMILY HISTORY:  Family History  Problem Relation Age of Onset  . Leukemia Paternal Grandfather   . Leukemia Cousin   . Colon cancer Neg Hx     CURRENT MEDICATIONS:  Outpatient Encounter Medications as of 03/31/2020  Medication Sig  . benztropine (COGENTIN) 1 MG tablet Take 1 mg by mouth 2 (two) times daily.  Marland Kitchen  clonazePAM (KLONOPIN) 0.5 MG tablet Take 0.5 mg by mouth 2 (two) times daily.   . divalproex (DEPAKOTE ER) 250 MG 24 hr tablet Take 250 mg by mouth at bedtime.  . docusate sodium (COLACE) 100 MG capsule Take 1 capsule (100 mg total) by mouth 2 (two) times daily.  . famotidine (PEPCID) 20 MG tablet Take 1 tablet (20 mg total) by mouth at bedtime. (Patient taking differently: Take 20 mg by mouth daily. )  . Ferrous Sulfate (IRON HIGH-POTENCY) 325 MG TABS Take 325 mg by mouth 2 (two) times daily with a meal.  . fluPHENAZine (PROLIXIN) 10 MG tablet   . loratadine (CLARITIN) 10 MG tablet Take 10 mg by mouth daily.  . metFORMIN (GLUCOPHAGE) 500 MG tablet Take 500 mg by mouth 2 (two) times daily with a meal.   . pantoprazole (PROTONIX) 40 MG tablet Take 1  tablet (40 mg total) by mouth 2 (two) times daily.  . polyethylene glycol (MIRALAX) 17 g packet Take 17 g by mouth daily as needed for mild constipation.  . PSYLLIUM FIBER PO Take 1 capsule by mouth 2 (two) times daily.   . simvastatin (ZOCOR) 20 MG tablet Take 20 mg by mouth daily.  . Vitamin D, Ergocalciferol, (DRISDOL) 1.25 MG (50000 UNIT) CAPS capsule Take 1 capsule (50,000 Units total) by mouth every Friday.  . [DISCONTINUED] heparin lock flush 100 UNIT/ML SOLN injection Inject 500 Units into the vein every 6 (six) weeks. Flush port with 10 cc of normal saline then flush with 20ml (500 units of hep lock flush every 6 weeks)  . [DISCONTINUED] sodium chloride 0.9 % injection Inject 10 mLs into the vein every 6 (six) weeks. Flush port with 10 cc of normal saline then flush with 54ml (500 units of hep lock flush every 6 weeks)  . [DISCONTINUED] fluPHENAZine (PROLIXIN) 5 MG tablet Take 10 mg by mouth in the morning and at bedtime.   . [DISCONTINUED] GAVILAX 17 GM/SCOOP powder MIX 1 CAPFUL IN 8 OUNCESYOF WATER/JUICE AND DRINK AS NEEDED FOR MILD CONSTIPATION.   No facility-administered encounter medications on file as of 03/31/2020.    ALLERGIES:  No Known Allergies   PHYSICAL EXAM:  ECOG Performance status: 1  Vitals:   03/31/20 1148  BP: (!) 115/52  Pulse: (!) 48  Resp: 16  Temp: 98.4 F (36.9 C)  SpO2: 100%   Filed Weights   03/31/20 1148  Weight: 156 lb 9.6 oz (71 kg)   Physical Exam Constitutional:      Appearance: Normal appearance. She is normal weight.  Cardiovascular:     Rate and Rhythm: Normal rate and regular rhythm.     Heart sounds: Normal heart sounds.  Pulmonary:     Effort: Pulmonary effort is normal.     Breath sounds: Normal breath sounds.  Abdominal:     General: Bowel sounds are normal.     Palpations: Abdomen is soft.  Musculoskeletal:        General: Normal range of motion.  Skin:    General: Skin is warm.  Neurological:     Mental Status: She is  alert and oriented to person, place, and time. Mental status is at baseline.  Psychiatric:        Mood and Affect: Mood normal.        Behavior: Behavior normal.        Thought Content: Thought content normal.        Judgment: Judgment normal.      LABORATORY DATA:  I have reviewed the labs as listed.  CBC    Component Value Date/Time   WBC 4.8 03/22/2020 1117   RBC 4.59 03/22/2020 1117   HGB 11.9 (L) 03/22/2020 1117   HCT 38.2 03/22/2020 1117   PLT 177 03/22/2020 1117   MCV 83.2 03/22/2020 1117   MCH 25.9 (L) 03/22/2020 1117   MCHC 31.2 03/22/2020 1117   RDW 16.2 (H) 03/22/2020 1117   LYMPHSABS 1.3 03/22/2020 1117   MONOABS 0.5 03/22/2020 1117   EOSABS 0.1 03/22/2020 1117   BASOSABS 0.0 03/22/2020 1117   CMP Latest Ref Rng & Units 03/22/2020 09/21/2019 05/08/2019  Glucose 70 - 99 mg/dL 112(H) 110(H) 111(H)  BUN 8 - 23 mg/dL 20 15 14   Creatinine 0.44 - 1.00 mg/dL 0.98 0.88 0.72  Sodium 135 - 145 mmol/L 137 141 140  Potassium 3.5 - 5.1 mmol/L 4.3 4.3 4.3  Chloride 98 - 111 mmol/L 104 101 108  CO2 22 - 32 mmol/L 25 29 27   Calcium 8.9 - 10.3 mg/dL 9.6 9.8 9.0  Total Protein 6.5 - 8.1 g/dL 7.7 8.0 6.9  Total Bilirubin 0.3 - 1.2 mg/dL 0.3 0.3 0.3  Alkaline Phos 38 - 126 U/L 45 59 48  AST 15 - 41 U/L 20 15 19   ALT 0 - 44 U/L 11 10 13     DIAGNOSTIC IMAGING:  I have independently reviewed the CT scans and discussed with the patient.  All questions were answered to patient's stated satisfaction. Encouraged patient to call with any new concerns or questions before his next visit to the cancer center and we can certain see him sooner, if needed.     ASSESSMENT & PLAN:  Malignant neoplasm of colon ([place]) 1.  Stage II adenocarcinoma of the colon: - Patient was diagnosed 01/2016. -She was treated with a right hemicolectomy with resection of terminal ileum.  There was a questionable liver lesion on image which was biopsied and negative for malignancy. -Postsurgical PET scan on  03/2016 showed negative for residual or metastatic disease.  There was a notation of RUL pulmonary nodule that was stable and non-hypermetabolic -CT of the chest on 01/2017 revealed stable lung nodule with benign etiology.  Also negative for recurrent or metastatic disease. -CT CAP done on 02/12/2018 showed 9 mm stable nodule in the posterior right upper lobe dating back to August 2017.  Stable and lacks metabolic activity on prior PET.  No evidence of metastatic disease in the thorax.  No evidence of metastatic disease involving the abdomen or pelvis.  Hepatic steatosis. - Colonoscopy done on 10/01/2018 showed the examinee did portion of the ileum was normal.  There was significant looping of the colon.  External and internal hemorrhoids.  No mass in the rectum.  Repeat colonoscopy in 5 years. -CT CAP done on 09/24/2018 showed stable postsurgical changes from right hemo-colectomy with no evidence of anastomotic tumor recurrence.  No findings of metastatic disease.  Questionable mild diffuse bladder wall thickening may be due to to under distention. -Patient was referred to urology but has not kept any of her appointments. -CT CAP done on 09/21/2019 showed no evidence of recurrent or metastatic disease in the chest, abdomen or pelvis.  Stable benign 9 mm pulmonary nodule in the posterior right upper lobe. -CT CAP done on 03/29/2020 showed no evidence of recurrent or metastatic disease in the chest, abdomen or pelvis.  Similar appearance of the pulmonary nodule in the posterior right upper lobe -Labs done on 03/22/2020 showed WBC 4.8, hemoglobin  11.9, platelets 177, creatinine 0.98, CEA 10 -Patient will return in 6 months with repeat labs and scans.  2.  Breast calcifications: - Patient had a mammogram on 08/2018 that was BI-RADS Category 0 suspicious. - Patient had a right breast biopsy done on 09/23/2018 that showed fibrocystic changes. -Mammogram done on 09/27/2019 showed B RADS category 1 negative. - She will  have a repeat mammogram in 1 year.  3.  Vitamin D deficiency: -Labs done on 09/27/2019 showed vitamin D level 9.66 -We will prescribe vitamin D 50,000 units weekly -Labs done on 03/22/2020 showed vitamin D 93.47  4.  Noncompliance: -Patient has a history of being noncompliant with follow-up and referrals. -Patient lives in a group home and is under a caretaker and relies on them for transportation.     Orders placed this encounter:  Orders Placed This Encounter  Procedures  . CT CHEST W CONTRAST  . CT ABDOMEN PELVIS W CONTRAST  . CBC with Differential/Platelet  . Comprehensive metabolic panel  . Ferritin  . Iron and TIBC  . Lactate dehydrogenase  . VITAMIN D 25 Hydroxy (Vit-D Deficiency, Fractures)  . Vitamin B12  . CEA      Francene Finders, FNP-C [place] [phone]

## 2020-03-31 NOTE — Assessment & Plan Note (Addendum)
1.  Stage II adenocarcinoma of the colon: - Patient was diagnosed 01/2016. -She was treated with a right hemicolectomy with resection of terminal ileum.  There was a questionable liver lesion on image which was biopsied and negative for malignancy. -Postsurgical PET scan on 03/2016 showed negative for residual or metastatic disease.  There was a notation of RUL pulmonary nodule that was stable and non-hypermetabolic -CT of the chest on 01/2017 revealed stable lung nodule with benign etiology.  Also negative for recurrent or metastatic disease. -CT CAP done on 02/12/2018 showed 9 mm stable nodule in the posterior right upper lobe dating back to August 2017.  Stable and lacks metabolic activity on prior PET.  No evidence of metastatic disease in the thorax.  No evidence of metastatic disease involving the abdomen or pelvis.  Hepatic steatosis. - Colonoscopy done on 10/01/2018 showed the examinee did portion of the ileum was normal.  There was significant looping of the colon.  External and internal hemorrhoids.  No mass in the rectum.  Repeat colonoscopy in 5 years. -CT CAP done on 09/24/2018 showed stable postsurgical changes from right hemo-colectomy with no evidence of anastomotic tumor recurrence.  No findings of metastatic disease.  Questionable mild diffuse bladder wall thickening may be due to to under distention. -Patient was referred to urology but has not kept any of her appointments. -CT CAP done on 09/21/2019 showed no evidence of recurrent or metastatic disease in the chest, abdomen or pelvis.  Stable benign 9 mm pulmonary nodule in the posterior right upper lobe. -CT CAP done on 03/29/2020 showed no evidence of recurrent or metastatic disease in the chest, abdomen or pelvis.  Similar appearance of the pulmonary nodule in the posterior right upper lobe -Labs done on 03/22/2020 showed WBC 4.8, hemoglobin 11.9, platelets 177, creatinine 0.98, CEA 10 -Patient will return in 6 months with repeat labs and  scans.  2.  Breast calcifications: - Patient had a mammogram on 08/2018 that was BI-RADS Category 0 suspicious. - Patient had a right breast biopsy done on 09/23/2018 that showed fibrocystic changes. -Mammogram done on 09/27/2019 showed B RADS category 1 negative. - She will have a repeat mammogram in 1 year.  3.  Vitamin D deficiency: -Labs done on 09/27/2019 showed vitamin D level 9.66 -We will prescribe vitamin D 50,000 units weekly -Labs done on 03/22/2020 showed vitamin D 93.47  4.  Noncompliance: -Patient has a history of being noncompliant with follow-up and referrals. -Patient lives in a group home and is under a caretaker and relies on them for transportation.

## 2020-07-27 ENCOUNTER — Ambulatory Visit: Payer: Medicare Other | Attending: Internal Medicine

## 2020-07-27 ENCOUNTER — Ambulatory Visit: Payer: Self-pay

## 2020-07-27 DIAGNOSIS — Z23 Encounter for immunization: Secondary | ICD-10-CM

## 2020-07-27 NOTE — Progress Notes (Signed)
   Covid-19 Vaccination Clinic  Name:  NYISHA CLIPPARD    MRN: [id number] DOB: [birth year]  07/27/2020  Ms. Dant was observed post Covid-19 immunization for 15 minutes without incident. She was provided with Vaccine Information Sheet and instruction to access the V-Safe system.   Ms. Retana was instructed to call 911 with any severe reactions post vaccine: Marland Kitchen Difficulty breathing  . Swelling of face and throat  . A fast heartbeat  . A bad rash all over body  . Dizziness and weakness   Immunizations Administered    Name Date Dose VIS Date Route   Moderna Covid-19 Booster Vaccine 07/27/2020  3:32 PM 0.25 mL 05/24/2020 Intramuscular   Manufacturer: Moderna   Lot: [id number]   [place]: [id number]

## 2020-08-27 IMAGING — MG DIGITAL SCREENING BILATERAL MAMMOGRAM WITH TOMO AND CAD
6 of 12 series · 6 of 36 positions shown · non-contrast
Comparison: Previous exam(s).

CLINICAL DATA: Screening.

EXAM:
DIGITAL SCREENING BILATERAL MAMMOGRAM WITH TOMO AND CAD

[L MLO synth-2D (1 of 2)]
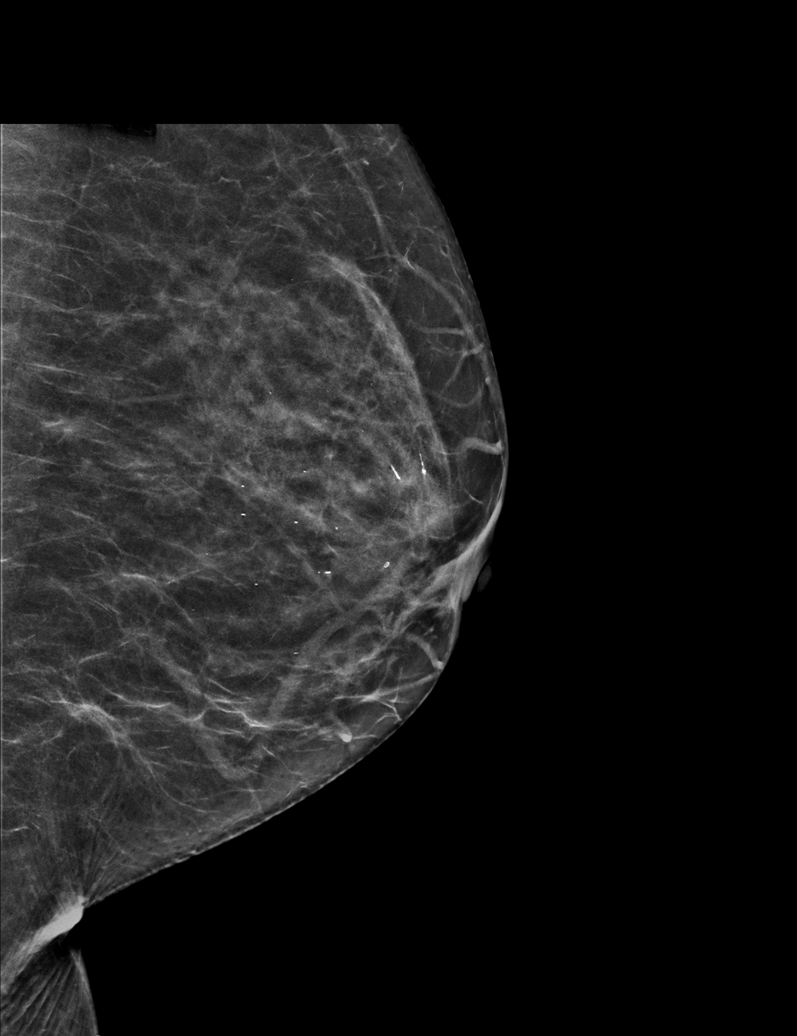

[L MLO synth-2D (2 of 2)]
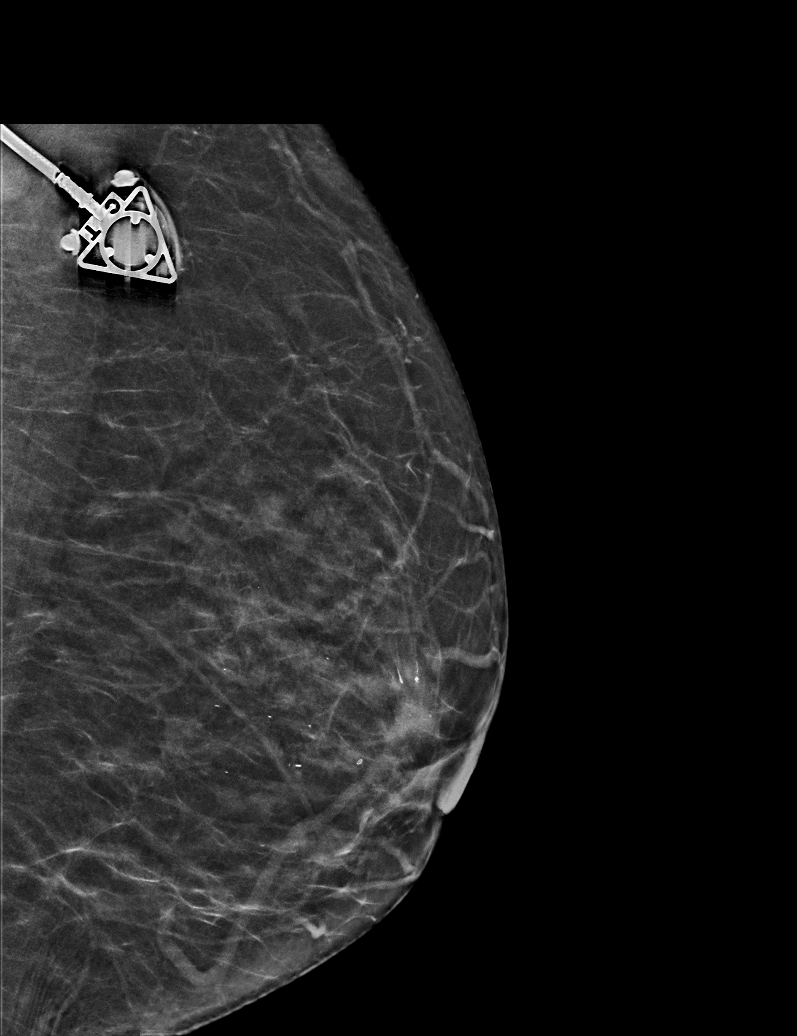

[R CC synth-2D (1 of 2)]
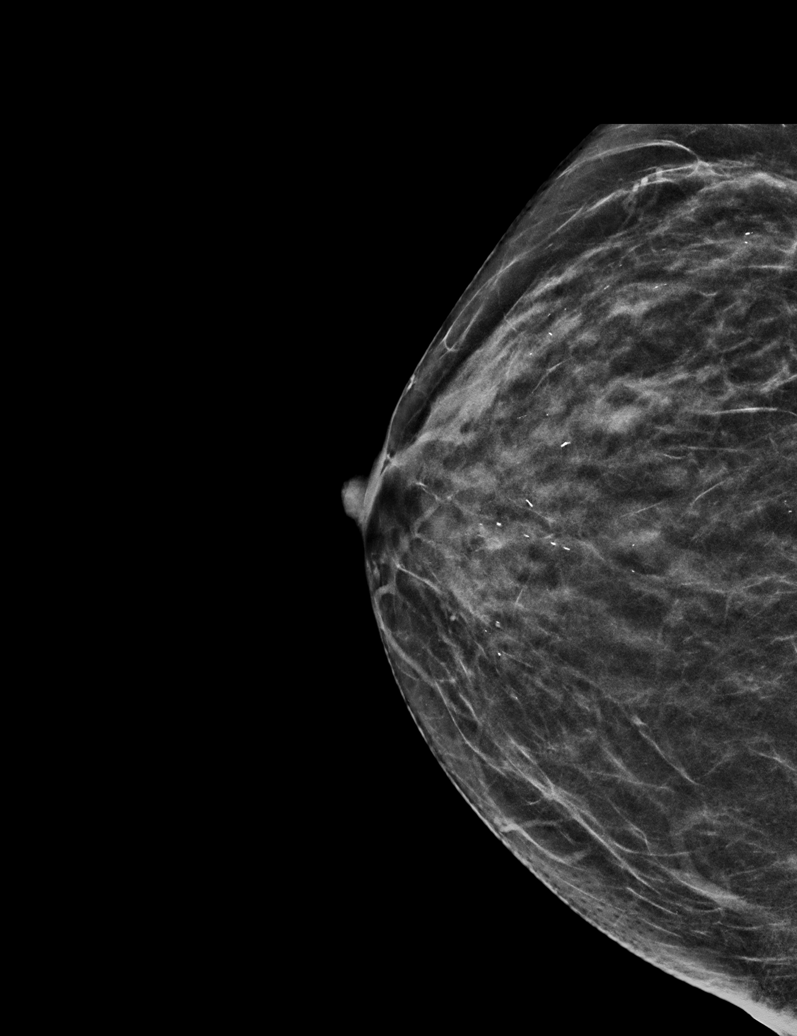

[L CC synth-2D]
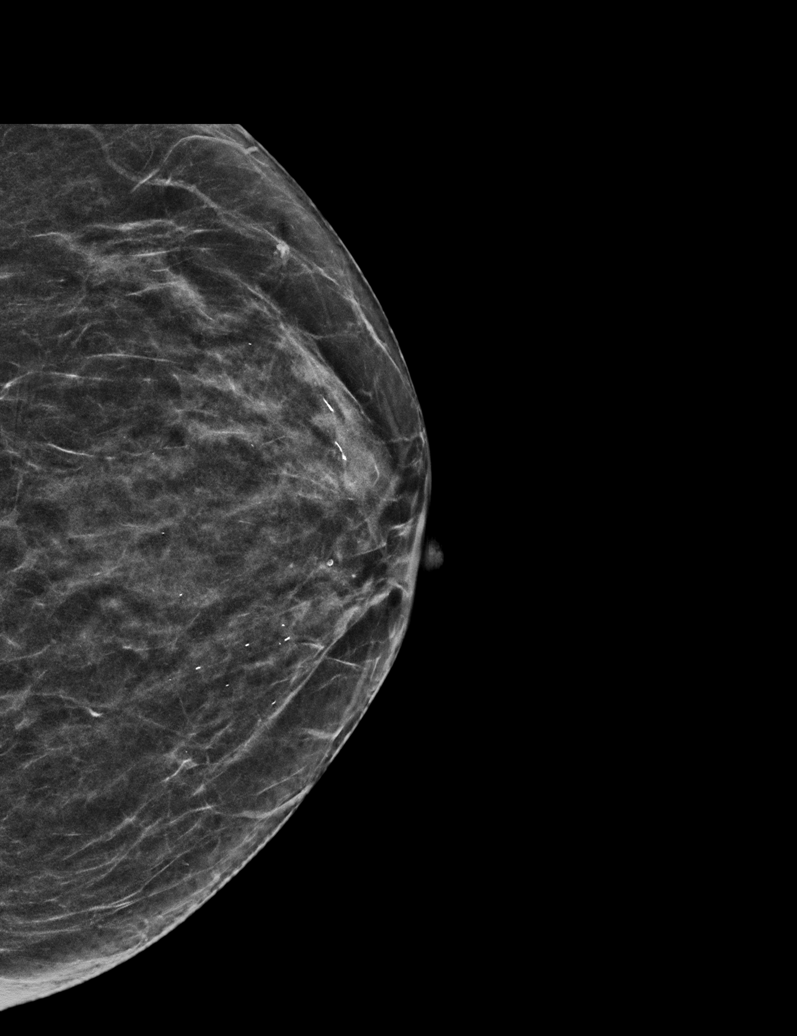

[R CC synth-2D (2 of 2)]
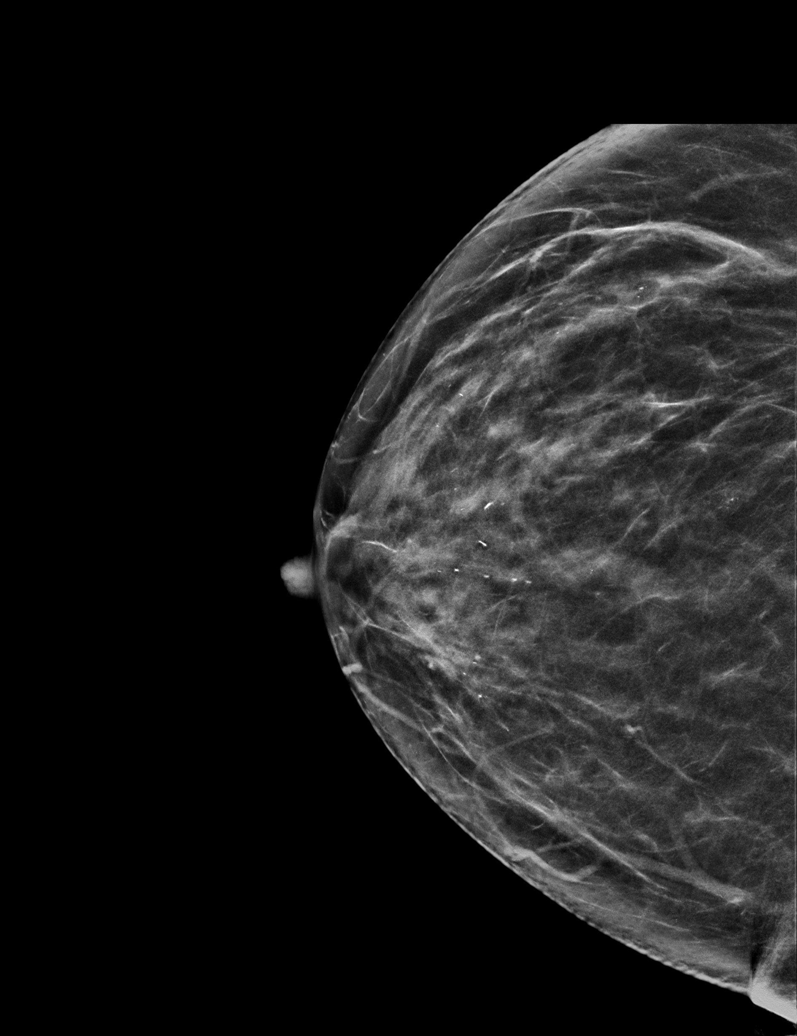

[R MLO synth-2D]
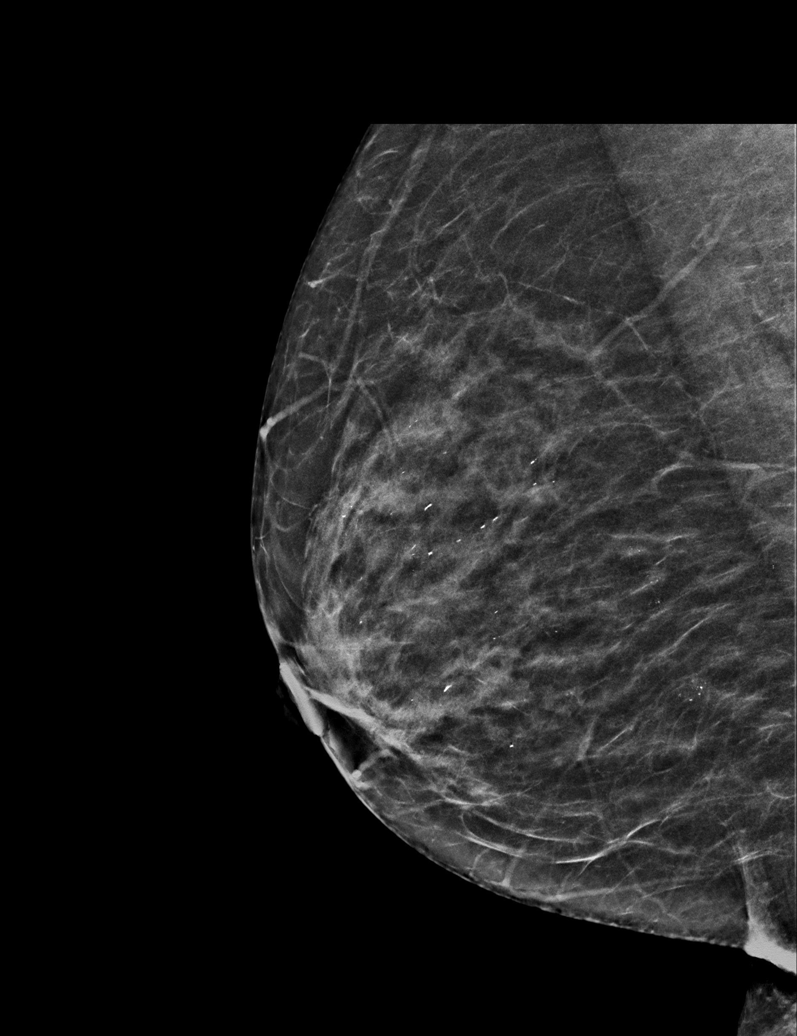

[6 of 36 positions shown; findings below may reference images not displayed]

ACR Breast Density Category c: The breast tissue is heterogeneously
dense, which may obscure small masses.
FINDINGS: In the right breast, calcifications warrant further evaluation with
magnified views. In the left breast, no findings suspicious for
malignancy. Port-A-Cath port overlies the low LEFT axilla on the MLO
view. Images were processed with CAD.
IMPRESSION: Further evaluation is suggested for calcifications in the right
breast.

RECOMMENDATION:
Diagnostic mammogram of the right breast. (Code:V4-0-66L)

The patient will be contacted regarding the findings, and additional
imaging will be scheduled.

BI-RADS CATEGORY  0: Incomplete. Need additional imaging evaluation
and/or prior mammograms for comparison.

## 2020-09-05 DEATH — deceased

## 2020-09-28 ENCOUNTER — Ambulatory Visit (HOSPITAL_COMMUNITY): Payer: Medicare Other

## 2020-09-28 ENCOUNTER — Inpatient Hospital Stay (HOSPITAL_COMMUNITY): Payer: Medicare Other

## 2020-10-04 ENCOUNTER — Ambulatory Visit (HOSPITAL_COMMUNITY): Payer: Medicare Other | Admitting: Hematology

## 2020-10-05 ENCOUNTER — Ambulatory Visit (HOSPITAL_COMMUNITY): Payer: Medicare Other | Admitting: Hematology

## 2020-10-08 IMAGING — MG MM CLIP PLACEMENT
2 series · 2 of 2 positions shown · non-contrast
Comparison: Previous exam(s).

CLINICAL DATA: Status post stereotactic biopsies of the right
breast.

EXAM:
DIAGNOSTIC RIGHT MAMMOGRAM POST STEREOTACTIC BIOPSIES

[R CC]
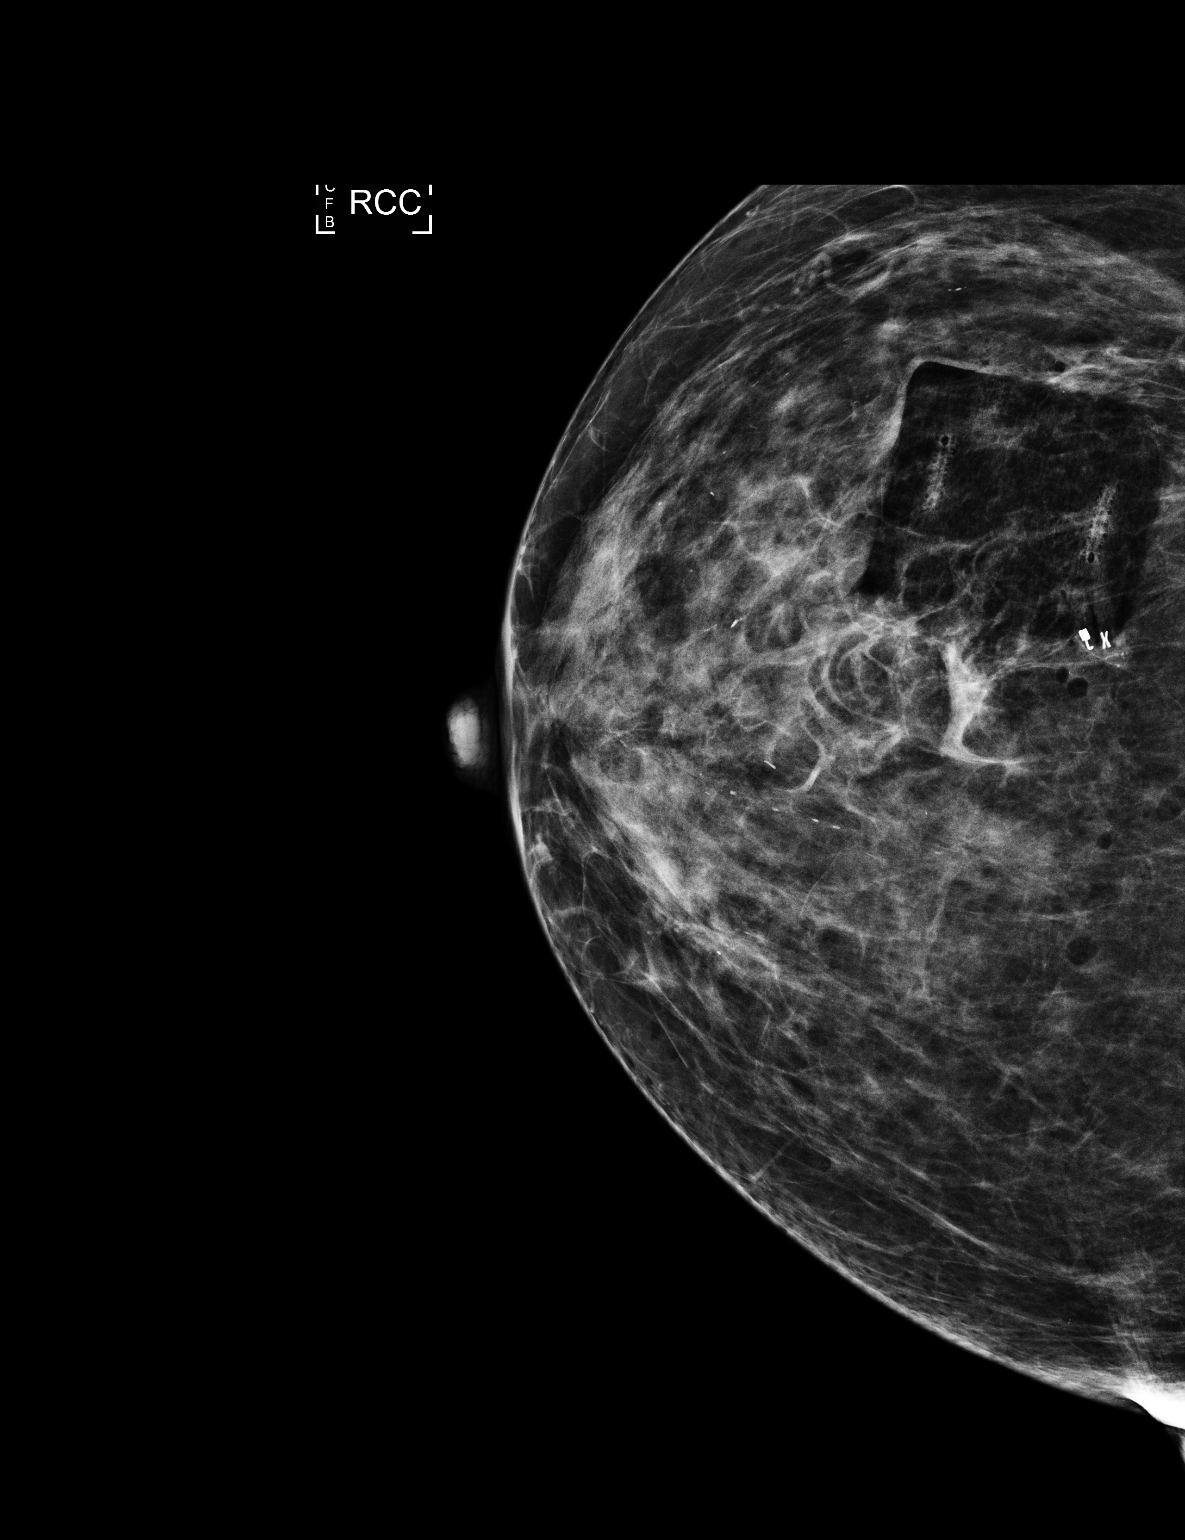

[R LM]
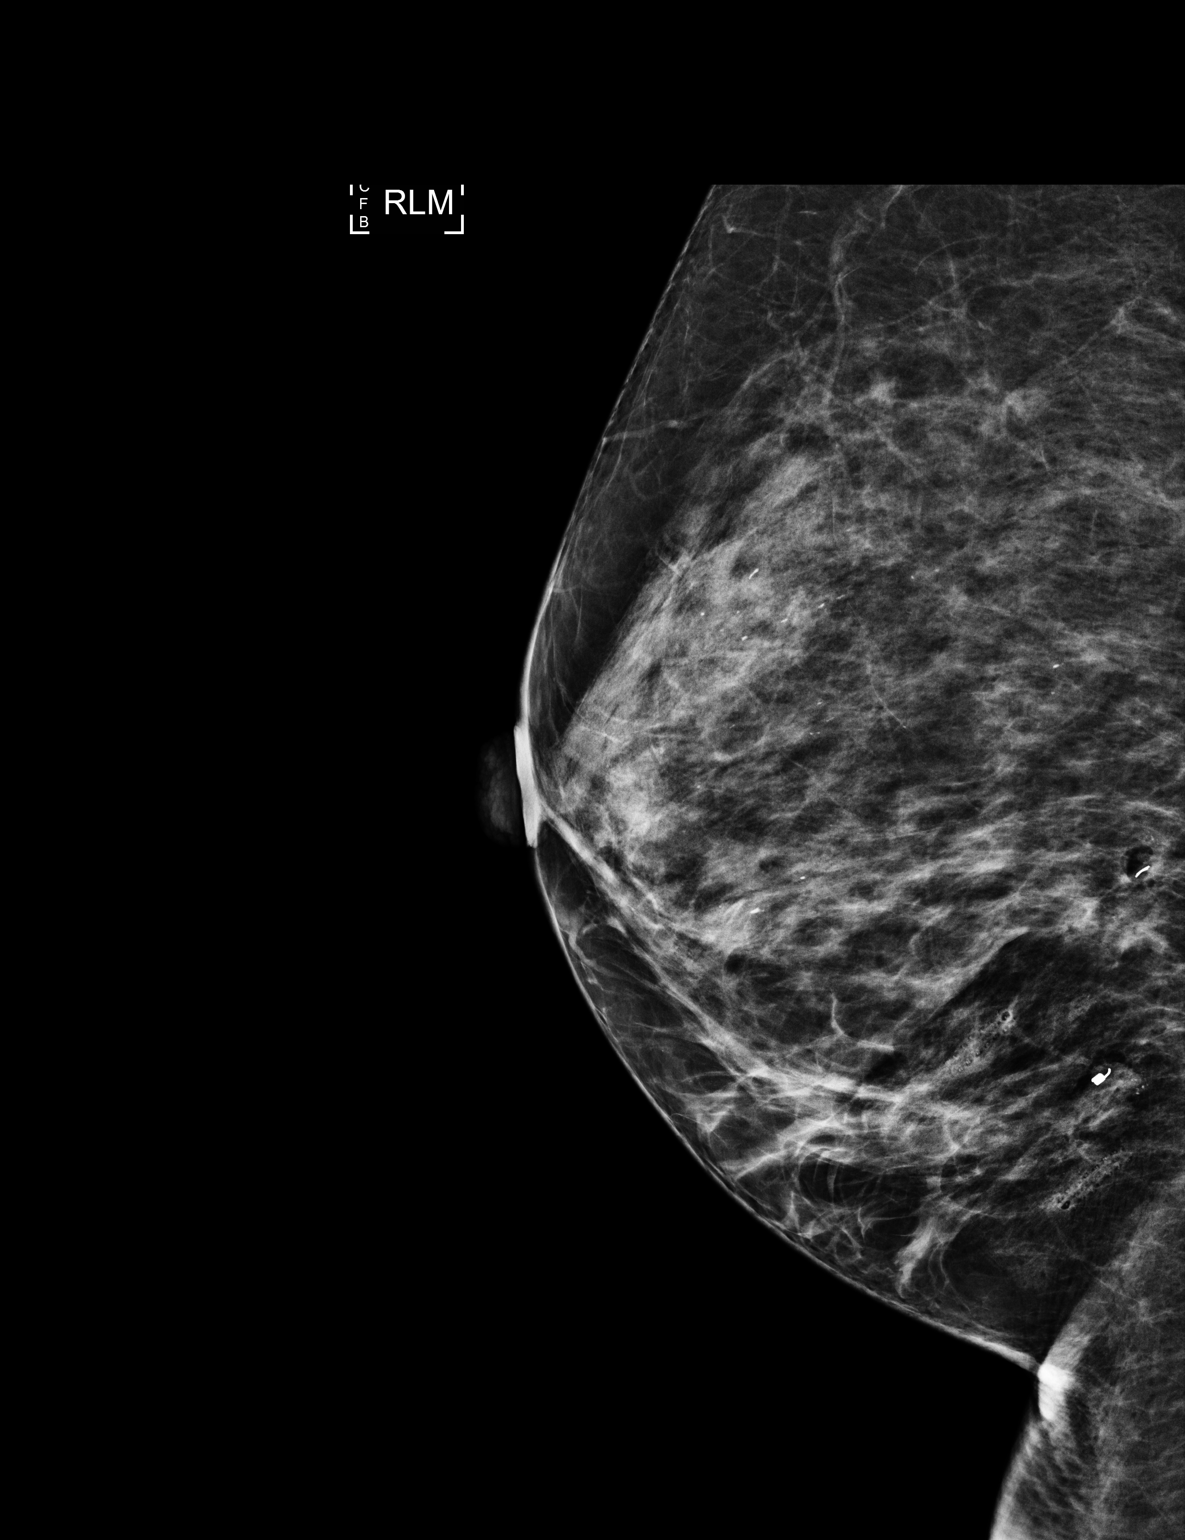

[2 of 2 positions shown; findings below may reference images not displayed]

FINDINGS: Mammographic images were obtained following stereotactic guided
biopsies of the right breast. Mammographic images show there coil
shaped and X shaped clips in the lower outer quadrant of the right
breast in appropriate position.
IMPRESSION: Status post stereotactic biopsies of the right breast with pathology
pending.

Final Assessment: Post Procedure Mammograms for Marker Placement

## 2023-09-09 ENCOUNTER — Encounter (INDEPENDENT_AMBULATORY_CARE_PROVIDER_SITE_OTHER): Payer: Self-pay | Admitting: *Deleted
